# Patient Record
Sex: Male | Born: 1945 | Hispanic: No | Marital: Married | Smoking: Former smoker
Health system: Southern US, Community
[De-identification: ages and names within clinical notes are randomized; demographics above are authoritative.]

## PROBLEM LIST (undated history)

## (undated) DIAGNOSIS — I10 Essential (primary) hypertension: Secondary | ICD-10-CM

## (undated) DIAGNOSIS — I639 Cerebral infarction, unspecified: Secondary | ICD-10-CM

## (undated) DIAGNOSIS — M1990 Unspecified osteoarthritis, unspecified site: Secondary | ICD-10-CM

## (undated) DIAGNOSIS — Z91018 Allergy to other foods: Secondary | ICD-10-CM

## (undated) DIAGNOSIS — E7800 Pure hypercholesterolemia, unspecified: Secondary | ICD-10-CM

## (undated) HISTORY — PX: TOOTH EXTRACTION: SUR596

## (undated) HISTORY — PX: CATARACT EXTRACTION: SUR2

## (undated) HISTORY — PX: APPENDECTOMY: SHX54

## (undated) HISTORY — DX: Allergy to other foods: Z91.018

## (undated) HISTORY — DX: Unspecified osteoarthritis, unspecified site: M19.90

---

## 2009-04-23 HISTORY — PX: LUMBAR DISC SURGERY: SHX700

## 2013-08-22 ENCOUNTER — Ambulatory Visit (HOSPITAL_COMMUNITY)
Admission: EM | Admit: 2013-08-22 | Discharge: 2013-08-22 | Disposition: A | Discharge status: Home / Self Care | Payer: Worker's Compensation | Attending: Emergency Medicine | Admitting: Emergency Medicine

## 2013-08-22 ENCOUNTER — Encounter (HOSPITAL_COMMUNITY): Payer: Self-pay | Admitting: Emergency Medicine

## 2013-08-22 ENCOUNTER — Emergency Department (HOSPITAL_COMMUNITY): Payer: Worker's Compensation

## 2013-08-22 ENCOUNTER — Encounter (HOSPITAL_COMMUNITY): Payer: Worker's Compensation | Admitting: Anesthesiology

## 2013-08-22 ENCOUNTER — Encounter (HOSPITAL_COMMUNITY)
Admission: EM | Disposition: A | Discharge status: Home / Self Care | Payer: Self-pay | Source: Home / Self Care | Attending: Emergency Medicine

## 2013-08-22 ENCOUNTER — Emergency Department (HOSPITAL_COMMUNITY): Payer: Worker's Compensation | Admitting: Anesthesiology

## 2013-08-22 ENCOUNTER — Inpatient Hospital Stay: Admit: 2013-08-22 | Payer: Self-pay | Admitting: Orthopedic Surgery

## 2013-08-22 DIAGNOSIS — Y990 Civilian activity done for income or pay: Secondary | ICD-10-CM | POA: Insufficient documentation

## 2013-08-22 DIAGNOSIS — S62639B Displaced fracture of distal phalanx of unspecified finger, initial encounter for open fracture: Secondary | ICD-10-CM | POA: Insufficient documentation

## 2013-08-22 DIAGNOSIS — W230XXA Caught, crushed, jammed, or pinched between moving objects, initial encounter: Secondary | ICD-10-CM | POA: Insufficient documentation

## 2013-08-22 DIAGNOSIS — S6710XA Crushing injury of unspecified finger(s), initial encounter: Secondary | ICD-10-CM | POA: Insufficient documentation

## 2013-08-22 HISTORY — PX: I & D EXTREMITY: SHX5045

## 2013-08-22 HISTORY — PX: PERCUTANEOUS PINNING: SHX2209

## 2013-08-22 SURGERY — IRRIGATION AND DEBRIDEMENT EXTREMITY
Anesthesia: General | Site: Finger | Laterality: Left

## 2013-08-22 MED ORDER — OXYCODONE HCL 5 MG/5ML PO SOLN: 5.0000 mg | Freq: Once | ORAL | Status: DC | PRN

## 2013-08-22 MED ORDER — LIDOCAINE HCL (CARDIAC) 20 MG/ML IV SOLN
INTRAVENOUS | Status: DC | PRN
Start: 2013-08-22 — End: 2013-08-22
  Administered 2013-08-22: 60 mg via INTRAVENOUS

## 2013-08-22 MED ORDER — MIDAZOLAM HCL 2 MG/2ML IJ SOLN
INTRAMUSCULAR | Status: AC
  Filled 2013-08-22: qty 2

## 2013-08-22 MED ORDER — FENTANYL CITRATE 0.05 MG/ML IJ SOLN
INTRAMUSCULAR | Status: AC
  Filled 2013-08-22: qty 5

## 2013-08-22 MED ORDER — PROMETHAZINE HCL 25 MG/ML IJ SOLN: 6.2500 mg | INTRAMUSCULAR | Status: DC | PRN

## 2013-08-22 MED ORDER — HYDROMORPHONE HCL PF 1 MG/ML IJ SOLN: 0.2500 mg | INTRAMUSCULAR | Status: DC | PRN

## 2013-08-22 MED ORDER — EPHEDRINE SULFATE 50 MG/ML IJ SOLN
INTRAMUSCULAR | Status: DC | PRN
  Administered 2013-08-22 (×4): 5 mg via INTRAVENOUS

## 2013-08-22 MED ORDER — FENTANYL CITRATE 0.05 MG/ML IJ SOLN
INTRAMUSCULAR | Status: DC | PRN
  Administered 2013-08-22: 100 ug via INTRAVENOUS

## 2013-08-22 MED ORDER — SODIUM CHLORIDE 0.9 % IR SOLN
Status: DC | PRN
  Administered 2013-08-22: 3000 mL

## 2013-08-22 MED ORDER — MORPHINE SULFATE 4 MG/ML IJ SOLN
4.0000 mg | Freq: Once | INTRAMUSCULAR | Status: AC
  Administered 2013-08-22: 4 mg via INTRAVENOUS
  Filled 2013-08-22: qty 1

## 2013-08-22 MED ORDER — ONDANSETRON HCL 4 MG/2ML IJ SOLN
INTRAMUSCULAR | Status: AC
  Filled 2013-08-22: qty 2

## 2013-08-22 MED ORDER — PROPOFOL 10 MG/ML IV BOLUS
INTRAVENOUS | Status: DC | PRN
  Administered 2013-08-22: 160 mg via INTRAVENOUS

## 2013-08-22 MED ORDER — TETANUS-DIPHTH-ACELL PERTUSSIS 5-2.5-18.5 LF-MCG/0.5 IM SUSP
0.5000 mL | Freq: Once | INTRAMUSCULAR | Status: AC
  Administered 2013-08-22: 0.5 mL via INTRAMUSCULAR
  Filled 2013-08-22: qty 0.5

## 2013-08-22 MED ORDER — ONDANSETRON HCL 4 MG/2ML IJ SOLN
INTRAMUSCULAR | Status: DC | PRN
  Administered 2013-08-22: 4 mg via INTRAVENOUS

## 2013-08-22 MED ORDER — CEFAZOLIN SODIUM 1-5 GM-% IV SOLN
1.0000 g | Freq: Once | INTRAVENOUS | Status: AC
  Administered 2013-08-22: 1 g via INTRAVENOUS
  Filled 2013-08-22: qty 50

## 2013-08-22 MED ORDER — OXYCODONE-ACETAMINOPHEN 5-325 MG PO TABS: ORAL_TABLET | ORAL | Status: DC

## 2013-08-22 MED ORDER — CEFAZOLIN SODIUM 1-5 GM-% IV SOLN
INTRAVENOUS | Status: AC
  Filled 2013-08-22: qty 50

## 2013-08-22 MED ORDER — SODIUM CHLORIDE 0.9 % IV SOLN
INTRAVENOUS | Status: DC | PRN
  Administered 2013-08-22: 17:00:00 via INTRAVENOUS

## 2013-08-22 MED ORDER — MIDAZOLAM HCL 5 MG/5ML IJ SOLN
INTRAMUSCULAR | Status: DC | PRN
  Administered 2013-08-22: 2 mg via INTRAVENOUS

## 2013-08-22 MED ORDER — OXYCODONE HCL 5 MG PO TABS: 5.0000 mg | ORAL_TABLET | Freq: Once | ORAL | Status: DC | PRN

## 2013-08-22 MED ORDER — BUPIVACAINE HCL (PF) 0.25 % IJ SOLN
INTRAMUSCULAR | Status: AC
  Filled 2013-08-22: qty 60

## 2013-08-22 MED ORDER — CEFAZOLIN SODIUM-DEXTROSE 2-3 GM-% IV SOLR
INTRAVENOUS | Status: AC
  Filled 2013-08-22: qty 50

## 2013-08-22 MED ORDER — SUCCINYLCHOLINE CHLORIDE 20 MG/ML IJ SOLN
INTRAMUSCULAR | Status: AC
  Filled 2013-08-22: qty 1

## 2013-08-22 MED ORDER — BUPIVACAINE HCL (PF) 0.25 % IJ SOLN
INTRAMUSCULAR | Status: DC | PRN
  Administered 2013-08-22: 10 mL

## 2013-08-22 MED ORDER — CEFAZOLIN SODIUM 1-5 GM-% IV SOLN
INTRAVENOUS | Status: DC | PRN
  Administered 2013-08-22: 1 g via INTRAVENOUS

## 2013-08-22 MED ORDER — OXYCODONE-ACETAMINOPHEN 5-325 MG PO TABS
ORAL_TABLET | ORAL | Status: AC
  Administered 2013-08-22: 2 via ORAL
  Filled 2013-08-22: qty 2

## 2013-08-22 MED ORDER — SULFAMETHOXAZOLE-TRIMETHOPRIM 800-160 MG PO TABS: 1.0000 | ORAL_TABLET | Freq: Two times a day (BID) | ORAL | Status: DC

## 2013-08-22 MED ORDER — OXYCODONE-ACETAMINOPHEN 5-325 MG PO TABS
1.0000 | ORAL_TABLET | Freq: Once | ORAL | Status: AC
  Administered 2013-08-22: 2 via ORAL

## 2013-08-22 SURGICAL SUPPLY — 70 items
BANDAGE COBAN STERILE 2 (GAUZE/BANDAGES/DRESSINGS) IMPLANT
BANDAGE CONFORM 2  STR LF (GAUZE/BANDAGES/DRESSINGS) IMPLANT
BANDAGE ELASTIC 3 VELCRO ST LF (GAUZE/BANDAGES/DRESSINGS) ×3 IMPLANT
BANDAGE ELASTIC 4 VELCRO ST LF (GAUZE/BANDAGES/DRESSINGS) ×3 IMPLANT
BANDAGE GAUZE 4  KLING STR (GAUZE/BANDAGES/DRESSINGS) ×3 IMPLANT
BANDAGE GAUZE ELAST BULKY 4 IN (GAUZE/BANDAGES/DRESSINGS) ×3 IMPLANT
BENZOIN TINCTURE PRP APPL 2/3 (GAUZE/BANDAGES/DRESSINGS) ×3 IMPLANT
BLADE 10 SAFETY STRL DISP (BLADE) ×3 IMPLANT
BLADE SURG ROTATE 9660 (MISCELLANEOUS) ×3 IMPLANT
BNDG COHESIVE 1X5 TAN STRL LF (GAUZE/BANDAGES/DRESSINGS) ×3 IMPLANT
BNDG ESMARK 4X9 LF (GAUZE/BANDAGES/DRESSINGS) ×3 IMPLANT
CLOSURE WOUND 1/2 X4 (GAUZE/BANDAGES/DRESSINGS) ×1
CORDS BIPOLAR (ELECTRODE) ×3 IMPLANT
COVER SURGICAL LIGHT HANDLE (MISCELLANEOUS) ×3 IMPLANT
CUFF TOURNIQUET SINGLE 18IN (TOURNIQUET CUFF) IMPLANT
CUFF TOURNIQUET SINGLE 24IN (TOURNIQUET CUFF) IMPLANT
DECANTER SPIKE VIAL GLASS SM (MISCELLANEOUS) ×3 IMPLANT
DRAIN PENROSE 1/4X12 LTX STRL (WOUND CARE) IMPLANT
DRAPE C-ARM MINI 42X72 WSTRAPS (DRAPES) ×3 IMPLANT
DRAPE OEC MINIVIEW 54X84 (DRAPES) ×3 IMPLANT
DRAPE SURG 17X23 STRL (DRAPES) ×3 IMPLANT
DRSG ADAPTIC 3X8 NADH LF (GAUZE/BANDAGES/DRESSINGS) IMPLANT
DRSG EMULSION OIL 3X3 NADH (GAUZE/BANDAGES/DRESSINGS) ×3 IMPLANT
DRSG PAD ABDOMINAL 8X10 ST (GAUZE/BANDAGES/DRESSINGS) ×6 IMPLANT
DURAPREP 26ML APPLICATOR (WOUND CARE) ×3 IMPLANT
GAUZE XEROFORM 1X8 LF (GAUZE/BANDAGES/DRESSINGS) ×3 IMPLANT
GLOVE BIO SURGEON STRL SZ7.5 (GLOVE) ×3 IMPLANT
GLOVE BIOGEL PI IND STRL 8 (GLOVE) ×1 IMPLANT
GLOVE BIOGEL PI INDICATOR 8 (GLOVE) ×2
GOWN BRE IMP PREV XXLGXLNG (GOWN DISPOSABLE) ×3 IMPLANT
GOWN STRL REIN XL XLG (GOWN DISPOSABLE) ×3 IMPLANT
GOWN STRL REUS W/ TWL LRG LVL3 (GOWN DISPOSABLE) ×2 IMPLANT
GOWN STRL REUS W/TWL LRG LVL3 (GOWN DISPOSABLE) ×4
HANDPIECE INTERPULSE COAX TIP (DISPOSABLE)
K-WIRE .035X5.75  STT K (WIRE) ×6 IMPLANT
KIT BASIN OR (CUSTOM PROCEDURE TRAY) ×3 IMPLANT
KIT ROOM TURNOVER OR (KITS) ×3 IMPLANT
LOOP VESSEL MAXI BLUE (MISCELLANEOUS) IMPLANT
LOOP VESSEL MINI RED (MISCELLANEOUS) IMPLANT
MANIFOLD NEPTUNE II (INSTRUMENTS) ×3 IMPLANT
NEEDLE HYPO 25GX1X1/2 BEV (NEEDLE) ×3 IMPLANT
NEEDLE HYPO 25X1 1.5 SAFETY (NEEDLE) IMPLANT
NS IRRIG 1000ML POUR BTL (IV SOLUTION) ×3 IMPLANT
PACK ORTHO EXTREMITY (CUSTOM PROCEDURE TRAY) ×3 IMPLANT
PAD ARMBOARD 7.5X6 YLW CONV (MISCELLANEOUS) ×6 IMPLANT
SCRUB BETADINE 4OZ XXX (MISCELLANEOUS) ×3 IMPLANT
SET HNDPC FAN SPRY TIP SCT (DISPOSABLE) IMPLANT
SOLUTION BETADINE 4OZ (MISCELLANEOUS) ×3 IMPLANT
SPLINT FINGER (SOFTGOODS) ×3 IMPLANT
SPONGE GAUZE 4X4 12PLY (GAUZE/BANDAGES/DRESSINGS) ×3 IMPLANT
SPONGE GAUZE 4X4 12PLY STER LF (GAUZE/BANDAGES/DRESSINGS) ×3 IMPLANT
SPONGE LAP 18X18 X RAY DECT (DISPOSABLE) ×3 IMPLANT
SPONGE LAP 4X18 X RAY DECT (DISPOSABLE) ×3 IMPLANT
STRIP CLOSURE SKIN 1/2X4 (GAUZE/BANDAGES/DRESSINGS) ×2 IMPLANT
SUCTION FRAZIER TIP 10 FR DISP (SUCTIONS) ×3 IMPLANT
SUT CHROMIC 6 0 PS 4 (SUTURE) ×3 IMPLANT
SUT ETHILON 4 0 P 3 18 (SUTURE) IMPLANT
SUT ETHILON 4 0 PS 2 18 (SUTURE) IMPLANT
SUT MON AB 5-0 P3 18 (SUTURE) ×3 IMPLANT
SUT PROLENE 4 0 P 3 18 (SUTURE) IMPLANT
SYR CONTROL 10ML LL (SYRINGE) ×3 IMPLANT
TOWEL OR 17X24 6PK STRL BLUE (TOWEL DISPOSABLE) ×3 IMPLANT
TOWEL OR 17X26 10 PK STRL BLUE (TOWEL DISPOSABLE) ×3 IMPLANT
TUBE ANAEROBIC SPECIMEN COL (MISCELLANEOUS) IMPLANT
TUBE CONNECTING 12'X1/4 (SUCTIONS) ×1
TUBE CONNECTING 12X1/4 (SUCTIONS) ×2 IMPLANT
TUBE FEEDING 5FR 15 INCH (TUBING) IMPLANT
UNDERPAD 30X30 INCONTINENT (UNDERPADS AND DIAPERS) ×3 IMPLANT
WATER STERILE IRR 1000ML POUR (IV SOLUTION) ×3 IMPLANT
YANKAUER SUCT BULB TIP NO VENT (SUCTIONS) ×3 IMPLANT

## 2013-08-22 NOTE — ED Notes (Signed)
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 Pt given 50 MCG of fentanyl by EMS.

## 2013-08-22 NOTE — ED Provider Notes (Signed)
CSN: [id number]     Arrival date & time 08/22/13  1233 History  This chart was scribed for Santiago GladHeather Mayu Ronk, PA-C  working with Juliet RudeNathan R. Rubin PayorPickering, MD by Ashley JacobsBrittany Andrews, ED scribe. This patient was seen in room TR11C/TR11C and the patient's care was started at 1:23 PM.    First MD Initiated Contact with Patient 08/22/13 1249     Chief Complaint  Patient presents with  . Finger Injury    left ring finger     (Consider location/radiation/quality/duration/timing/severity/associated sxs/prior Treatment) The history is provided by the patient and medical records. No language interpreter was used.   HPI Comments: Joyce CopaSum Y Briel is a 68 y.o. male who presents to the Emergency Department via EMS complaining of left fourth finger injury that occurred around a hour ago. Pt slammed the tip of his left ring finger in the door of a laundry machine while at work..  Pt has numbness and moderate pain to the site. Pt was administered 50 MCG of fentanyl via EMS, which helped with the pain. Pt is unsure if his tetanus shot is UTD.  History reviewed. No pertinent past medical history. No past surgical history on file. No family history on file. History  Substance Use Topics  . Smoking status: Not on file  . Smokeless tobacco: Not on file  . Alcohol Use: Not on file    Review of Systems  Skin:       Deep laceration to the left ring finger      Allergies  Review of patient's allergies indicates no known allergies.  Home Medications   Prior to Admission medications   Not on File   BP 146/66  Pulse 65  Temp(Src) 97.5 F (36.4 C) (Oral)  SpO2 98% Physical Exam  Nursing note and vitals reviewed. Constitutional: He is oriented to person, place, and time. He appears well-developed and well-nourished.  HENT:  Head: Normocephalic and atraumatic.  Cardiovascular: Normal rate, regular rhythm and normal heart sounds.   Pulmonary/Chest: Effort normal and breath sounds normal.  Musculoskeletal:    Left ring finger with laceration just proximal to the nail.  Ulnar deviation of tip of finger.  Intact capillary refill and sensation present  of fingertip.  Decreased flexion and extension at the DIP of the left 4th digit.    Neurological: He is alert and oriented to person, place, and time.  Sensation is intact   Psychiatric: He has a normal mood and affect.    ED Course  Procedures (including critical care time) DIAGNOSTIC STUDIES: Oxygen Saturation is 98% on room air, normal by my interpretation.    COORDINATION OF CARE:  1:19 PM Discussed course of care with pt . Pt understands and agrees.   Labs Review Labs Reviewed - No data to display  Imaging Review No results found.   EKG Interpretation None     3:09 PM Discussed with Dr. Merlyn LotKuzma with Hand Surgery.  He recommends giving the patient Ancef IV.  He reports that he will come see patient in the ED. MDM   Final diagnoses:  None  Patient with open compound fracture of the left 4th digit.  Tetanus updated.  Patient given 1 gram Ancef IV in the ED.  Dr. Merlyn LotKuzma with Hand Surgery consulted.  Patient brought to the OR and finger repaired by Dr. Merlyn LotKuzma.    I personally performed the services described in this documentation, which was scribed in my presence. The recorded information has been reviewed and is accurate.  Santiago GladHeather Taylar Hartsough, PA-C 08/25/13 2239

## 2013-08-22 NOTE — ED Notes (Signed)
Dr Pickering in w/pt.  

## 2013-08-22 NOTE — H&P (Signed)
  Nathan Bruce is an 68 y.o. male.   Chief Complaint: left ring finger crush HPI: 68 yo rhd male states he crushed tip of left ring finger in washing machine door at work today.  Present with two family members who are helping to interpret as he speaks vietnamese.  Reports no previous injury to left ring finger and no other injury at this time.  History reviewed. No pertinent past medical history.  Past Surgical History  Procedure Laterality Date  . Lumbar disc surgery  2011    in [place]     No family history on file. Social History:  has no tobacco, alcohol, and drug history on file.  Allergies: No Known Allergies   (Not in a hospital admission)  No results found for this or any previous visit (from the past 48 hour(s)).  Dg Finger Ring Left  08/22/2013   CLINICAL DATA:  Pain and numbness in the distal left ring finger following a crush injury.  EXAM: LEFT RING FINGER 2+V  COMPARISON:  None.  FINDINGS: Comminuted fracture of the fourth distal phalanx with a skin laceration and soft tissue swelling. There is 1/3 shaft width of radial post placement there is ulnar angulation of the distal fragment.  IMPRESSION: Comminuted fracture of the fourth distal phalanx with an associated large skin laceration, as described above.   Electronically Signed   By: Gordan PaymentSteve  Reid M.D.   On: 08/22/2013 14:33     A comprehensive review of systems was negative.  Blood pressure 146/66, pulse 65, temperature 97.5 F (36.4 C), temperature source Oral, SpO2 98.00%.  General appearance: alert, cooperative and appears stated age Head: Normocephalic, without obvious abnormality, atraumatic Neck: supple, symmetrical, trachea midline Resp: clear to auscultation bilaterally Cardio: regular rate and rhythm GI: non tender Extremities: intact sensation and capillary refill all digits.  +epl/fpl/io.  right ue: no wounds or ttp.  left ue: no wounds or ttp except ring finger.  left ring finger with volar and radial  wound at level of base of nail.  ulnar deviation of tip of finger.  intact capillary refill and sensation present on both radial and ulnar sides of fingertip.  able to weakly flex and extend at dip joint.  Pulses: 2+ and symmetric Skin: Skin color, texture, turgor normal. No rashes or lesions Neurologic: Grossly normal Incision/Wound: As above  Assessment/Plan Left ring fingertip crush with open fracture.  Recommend OR for I&D of fracture and pinning of distal phalanx and dip joint.  Repair of tendon/artery/nerve as indicated.  Risks, benefits, and alternatives of surgery were discussed and the patient agrees with the plan of care.  A phone interpreter was used to obtain informed consent.   Tami RibasKevin R Annalina Needles 08/22/2013, 3:59 PM

## 2013-08-22 NOTE — Op Note (Signed)
Intra-operative fluoroscopic images in the AP, lateral, and oblique views were taken and evaluated by myself.  Reduction and hardware placement were confirmed.  There was no intraarticular penetration of permanent hardware.  

## 2013-08-22 NOTE — Op Note (Signed)
NAMShelbie Bruce:  [place], [place]                    ACCOUNT NO.:  [id number]  MEDICAL RECORD NO.:  [id number]  LOCATION:  [facility]                         FACILITY:  [facility]  PHYSICIAN:  Betha LoaKevin Edwardine Deschepper, MD        DATE OF BIRTH:  July 10, 1945  DATE OF PROCEDURE:  08/22/2013 DATE OF DISCHARGE:  08/22/2013                              OPERATIVE REPORT   PREOPERATIVE DIAGNOSIS:  Left ring finger tip crush injury.  POSTOPERATIVE DIAGNOSIS:  Left ring finger tip crush injury.  PROCEDURE:   1. Irrigation and debridement of left ring distal phalanx open fracture 2. Open reduction and percutaneous pinning of left ring finger distal   phalanx fracture 3. Repair of nail bed and skin lacerations.  SURGEON:  Betha LoaKevin Serena Petterson, MD  ASSISTANT:  None.  ANESTHESIA:  General.  IV FLUIDS:  Per Anesthesia flow sheet.  ESTIMATED BLOOD LOSS:  Minimal.  COMPLICATIONS:  None.  SPECIMENS:  None.  TOURNIQUET TIME:  Fifty seven minutes.  DISPOSITION:  Stable to PACU.  INDICATIONS:  [name] is a 68 year old right-hand dominant male who states while at work, he smashed his left ring finger tip in an industrial washing machine causing laceration and crushing to the fingertip.  [facility] was [facility] to [facility] where he was evaluated.  Radiographs revealed a distal phalanx fracture.  I was consulted for management of injury.  On examination, he had intact sensation and capillary refill in the fingertip.  He had a laceration on the radial and volar sides of the finger involving the nail on the dorsal side.  His radiographs showed a comminuted fracture of the distal phalanx.  I recommended [name] go into the operating room for irrigation and debridement of the open fracture, fixation of the fracture, and repair of tendon, artery, and nerve as necessary.  Risks, benefits, and alternatives of surgery were discussed including the risk of blood loss, infection, damage to nerves, vessels, tendons, ligaments, bone;  failure of surgery; need for additional surgery, complications with wound healing, continued pain, nonunion, malunion, stiffness.  He voiced understanding of these risks and elected to proceed.  OPERATIVE COURSE:  After being identified preoperatively by myself, the patient and I agreed upon procedure and site procedure.  Surgical site was marked.  Risks, benefits, and alternatives of surgery were reviewed and he wished to proceed.  Surgical consent had been signed.  A telephone-based interpreter was used to obtain the informed consent.  He had been given IV Ancef, his tetanus updated in the emergency department.  He was given an additional g of IV Ancef, before going to the operating room.  He was transferred to the operating room and placed on the operating room table in supine position.  The left upper extremity on arm board.  General anesthesia was induced by anesthesiologist.  Left upper extremity was prepped and draped in normal sterile orthopedic fashion.  Surgical pause was performed between surgeons, anesthesia, operating staff, and all were in agreement as to the patient, procedure, and site procedure.  Tourniquet at the proximal aspect of the extremity was inflated to 250 mmHg after exsanguination of the limb with Esmarch bandage.  The wound was explored.  The radial digital nerve and artery were identified and were intact.  The FDP tendon was identified and was intact into its insertion at the proximal portion of the fracture.  There was no gross contamination.  Clot was removed.  The open fracture was irrigated with 3000 mL of sterile saline by cysto tubing.  It was then reduced under direct visualization and two 0.035-inch K-wire was advanced from the tip of the finger across the fracture and across the DIP joint.  C-arm was used in AP and lateral projections to ensure appropriate reduction, position of hardware which was the case.  Good reduction was obtained.  The finger  was clinically in good alignment.  A 5-0 Monocryl suture was then used to repair the skin lacerations and 6-0 chromic suture was used to repair the nail bed lacerations.  Some horizontal mattress sutures for the nail bed had to be used.  There was good apposition of all soft tissues.  The pins were bent and cut short.  A piece of Xeroform was placed in the nail fold and the wounds were all dressed with sterile Xeroform, 4x4s, and wrapped with a Kling bandage lightly.  A Alumafoam splint was placed and wrapped with a Kling and Coban dressing lightly.  The tourniquet was deflated at 57 minutes.  Fingertips were pink with brisk capillary refill after deflation of tourniquet.  Operative drapes were broken down.  The patient was awoken from anesthesia safely.  He was transferred back to stretcher and taken to PACU in stable condition.  I will see him back in the office in 1 week for postoperative followup.  I will give him Percocet 5/325, 1-2 p.o. q.6 hours p.r.n. pain, dispensed #40, and Bactrim DS 1 p.o. b.i.d. x7 days.     Betha LoaKevin Riona Lahti, MD     KK/MEDQ  D:  08/22/2013  T:  08/22/2013  Job:  [id number]

## 2013-08-22 NOTE — Anesthesia Postprocedure Evaluation (Signed)
Anesthesia Post Note  Patient: Nathan Bruce  Procedure(s) Performed: Procedure(s) (LRB): IRRIGATION AND DEBRIDEMENT EXTREMITY (Left) PERCUTANEOUS PINNING EXTREMITY (Left)  Anesthesia type: general  Patient location: PACU  Post pain: Pain level controlled  Post assessment: Patient's Cardiovascular Status Stable  Last Vitals:  Filed Vitals:   08/22/13 1900  BP:   Pulse: 64  Temp: 36.7 C  Resp: 16    Post vital signs: Reviewed and stable  Level of consciousness: sedated  Complications: No apparent anesthesia complications

## 2013-08-22 NOTE — Anesthesia Preprocedure Evaluation (Addendum)
Anesthesia Evaluation  Patient identified by MRN, date of birth, ID band Patient awake    Reviewed: Allergy & Precautions, H&P , NPO status , Patient's Chart, lab work & pertinent test results  History of Anesthesia Complications Negative for: history of anesthetic complications  Airway Mallampati: II TM Distance: >3 FB Neck ROM: full    Dental  (+) Poor Dentition, Dental Advidsory Given   Pulmonary neg pulmonary ROS,    Pulmonary exam normal       Cardiovascular negative cardio ROS  Rhythm:regular Rate:Normal     Neuro/Psych negative neurological ROS  negative psych ROS   GI/Hepatic negative GI ROS, Neg liver ROS,   Endo/Other  negative endocrine ROS  Renal/GU negative Renal ROS     Musculoskeletal   Abdominal   Peds  Hematology   Anesthesia Other Findings   Reproductive/Obstetrics negative OB ROS                           Anesthesia Physical Anesthesia Plan  ASA: I and emergent  Anesthesia Plan:    Post-op Pain Management:    Induction:   Airway Management Planned:   Additional Equipment:   Intra-op Plan:   Post-operative Plan:   Informed Consent: I have reviewed the patients History and Physical, chart, labs and discussed the procedure including the risks, benefits and alternatives for the proposed anesthesia with the patient or authorized representative who has indicated his/her understanding and acceptance.   Dental Advisory Given  Plan Discussed with: Anesthesiologist, CRNA and Surgeon  Anesthesia Plan Comments:        Anesthesia Quick Evaluation

## 2013-08-22 NOTE — Brief Op Note (Signed)
08/22/2013  6:18 PM  PATIENT:  Nathan Bruce  68 y.o. male  PRE-OPERATIVE DIAGNOSIS:  crush injury  POST-OPERATIVE DIAGNOSIS:  crush injury  PROCEDURE:  Procedure(s) with comments: IRRIGATION AND DEBRIDEMENT EXTREMITY (Left) - left ring finger PERCUTANEOUS PINNING EXTREMITY (Left) - left ring finger  SURGEON:  Surgeon(s) and Role:    * Tami RibasKevin R Abb Gobert, MD - Primary  PHYSICIAN ASSISTANT:   ASSISTANTS: none   ANESTHESIA:   general  EBL:     BLOOD ADMINISTERED:none  DRAINS: none   LOCAL MEDICATIONS USED:  MARCAINE     SPECIMEN:  No Specimen  DISPOSITION OF SPECIMEN:  N/A  COUNTS:  YES  TOURNIQUET:   Total Tourniquet Time Documented: Upper Arm (Left) - 57 minutes Total: Upper Arm (Left) - 57 minutes   DICTATION: .Other Dictation: Dictation Number [id number]  PLAN OF CARE: Discharge to home after PACU  PATIENT DISPOSITION:  PACU - hemodynamically stable.

## 2013-08-22 NOTE — Transfer of Care (Signed)
Immediate Anesthesia Transfer of Care Note  Patient: Nathan Bruce  Procedure(s) Performed: Procedure(s) with comments: IRRIGATION AND DEBRIDEMENT EXTREMITY (Left) - left ring finger PERCUTANEOUS PINNING EXTREMITY (Left) - left ring finger  Patient Location: PACU  Anesthesia Type:General  Level of Consciousness: awake and alert   Airway & Oxygen Therapy: Patient Spontanous Breathing and Patient connected to nasal cannula oxygen  Post-op Assessment: Report given to PACU RN  Post vital signs: Reviewed and stable  Complications: No apparent anesthesia complications

## 2013-08-22 NOTE — Discharge Instructions (Signed)

## 2013-08-22 NOTE — Op Note (Signed)
[id number] 

## 2013-08-25 ENCOUNTER — Encounter (HOSPITAL_COMMUNITY): Payer: Self-pay | Admitting: Orthopedic Surgery

## 2013-08-27 NOTE — ED Provider Notes (Signed)
Medical screening examination/treatment/procedure(s) were performed by non-physician practitioner and as supervising physician I was immediately available for consultation/collaboration.   EKG Interpretation None       Juliet RudeNathan R. Rubin PayorPickering, MD 08/27/13 [phone]

## 2014-07-11 ENCOUNTER — Ambulatory Visit (INDEPENDENT_AMBULATORY_CARE_PROVIDER_SITE_OTHER): Payer: Commercial Managed Care - PPO | Admitting: Emergency Medicine

## 2014-07-11 VITALS — BP 120/60 | HR 84 | Temp 97.7°F | Resp 20 | Ht 64.0 in | Wt 152.2 lb

## 2014-07-11 DIAGNOSIS — K529 Noninfective gastroenteritis and colitis, unspecified: Secondary | ICD-10-CM | POA: Diagnosis not present

## 2014-07-11 DIAGNOSIS — R110 Nausea: Secondary | ICD-10-CM | POA: Diagnosis not present

## 2014-07-11 LAB — POCT CBC
GRANULOCYTE PERCENT: 88.7 % — AB (ref 37–80)
HEMATOCRIT: 44.6 % (ref 43.5–53.7)
Hemoglobin: 14.4 g/dL (ref 14.1–18.1)
LYMPH, POC: 0.5 — AB (ref 0.6–3.4)
MCH: 26.6 pg — AB (ref 27–31.2)
MCHC: 32.3 g/dL (ref 31.8–35.4)
MCV: 82.3 fL (ref 80–97)
MID (CBC): 0.6 (ref 0–0.9)
MPV: 6.8 fL (ref 0–99.8)
PLATELET COUNT, POC: 219 10*3/uL (ref 142–424)
POC Granulocyte: 8.3 — AB (ref 2–6.9)
POC LYMPH %: 5.2 % — AB (ref 10–50)
POC MID %: 6.1 %M (ref 0–12)
RBC: 5.42 M/uL (ref 4.69–6.13)
RDW, POC: 14.4 %
WBC: 9.4 10*3/uL (ref 4.6–10.2)

## 2014-07-11 MED ORDER — LOPERAMIDE HCL 2 MG PO TABS: ORAL_TABLET | ORAL | Status: DC

## 2014-07-11 MED ORDER — ONDANSETRON 8 MG PO TBDP: 8.0000 mg | ORAL_TABLET | Freq: Three times a day (TID) | ORAL | Status: DC | PRN

## 2014-07-11 MED ORDER — ONDANSETRON 4 MG PO TBDP
8.0000 mg | ORAL_TABLET | Freq: Once | ORAL | Status: AC
  Administered 2014-07-11: 8 mg via ORAL

## 2014-07-11 NOTE — Progress Notes (Signed)
[facility] [address], [place] [address] [phone]  Date:  07/11/2014   Name:  Nathan Bruce   DOB:  1945/10/15   MRN:  [id number]  PCP:  No PCP Per Patient    Chief Complaint: Abdominal Pain   History of Present Illness:  Nathan Bruce is a 69 y.o. very pleasant male patient who presents with the following:  Yesterday developed nausea and vomiting.  Poor po intake Diarrhea watery in nature. The patient has no complaint of blood, mucous, or pus in her stools. No fever or chills. Now worse and has epigastric burning. No ill contacts Lots of caffeine. No alcohol Non smoker. No excess NSAID or ASA. No improvement with over the counter medications or other home remedies. Denies other complaint or health concern today.   There are no active problems to display for this patient.   No past medical history on file.  Past Surgical History  Procedure Laterality Date  . Lumbar disc surgery  2011    in [place]   . I&d extremity Left 08/22/2013    Procedure: IRRIGATION AND DEBRIDEMENT EXTREMITY;  Surgeon: Tami Ribas, MD;  Location: [facility] OR;  Service: Orthopedics;  Laterality: Left;  left ring finger  . Percutaneous pinning Left 08/22/2013    Procedure: PERCUTANEOUS PINNING EXTREMITY;  Surgeon: Tami Ribas, MD;  Location: [facility] OR;  Service: Orthopedics;  Laterality: Left;  left ring finger    History  Substance Use Topics  . Smoking status: Never Smoker   . Smokeless tobacco: Never Used  . Alcohol Use: No    No family history on file.  No Known Allergies  Medication list has been reviewed and updated.  No current outpatient prescriptions on file prior to visit.   No current facility-administered medications on file prior to visit.    Review of Systems:  As per HPI, otherwise negative.    Physical Examination: Filed Vitals:   07/11/14 0848  BP: 120/60  Pulse: 84  Temp: 97.7 F (36.5 C)  Resp: 20   Filed Vitals:   07/11/14 0848   Height:  (1.626 m)  Weight: 152 lb 4 oz (69.06 kg)   Body mass index is 26.12 kg/(m^2). Ideal Body Weight: Weight in (lb) to have BMI = 25: 145.3  GEN: WDWN, NAD, Non-toxic, A & O x 3  Dry appearing.  Not icteric HEENT: Atraumatic, Normocephalic. Neck supple. No masses, No LAD. Ears and Nose: No external deformity. CV: RRR, No M/G/R. No JVD. No thrill. No extra heart sounds. PULM: CTA B, no wheezes, crackles, rhonchi. No retractions. No resp. distress. No accessory muscle use. ABD: S, NT, ND, +BS. No rebound. No HSM. EXTR: No c/c/e NEURO Normal gait.  PSYCH: Normally interactive. Conversant. Not depressed or anxious appearing.  Calm demeanor.    Assessment and Plan: Gastroenteritis zofran Imodium Clears  Signed,  Phillips Odor, MD   Results for orders placed or performed in visit on 07/11/14  POCT CBC  Result Value Ref Range   WBC 9.4 4.6 - 10.2 K/uL   Lymph, poc 0.5 (A) 0.6 - 3.4   POC LYMPH PERCENT 5.2 (A) 10 - 50 %L   MID (cbc) 0.6 0 - 0.9   POC MID % 6.1 0 - 12 %M   POC Granulocyte 8.3 (A) 2 - 6.9   Granulocyte percent 88.7 (A) 37 - 80 %G   RBC 5.42 4.69 - 6.13 M/uL   Hemoglobin 14.4 14.1 -  18.1 g/dL   HCT, POC 26.744.6 12.443.5 - 53.7 %   MCV 82.3 80 - 97 fL   MCH, POC 26.6 (A) 27 - 31.2 pg   MCHC 32.3 31.8 - 35.4 g/dL   RDW, POC 58.014.4 %   Platelet Count, POC 219 142 - 424 K/uL   MPV 6.8 0 - 99.8 fL

## 2014-07-11 NOTE — Patient Instructions (Signed)
Clear Liquid Diet A clear liquid diet is a short-term diet that is prescribed to provide the necessary fluid and basic energy you need when you can have nothing else. The clear liquid diet consists of liquids or solids that will become liquid at room temperature. You should be able to see through the liquid. There are many reasons that you may be restricted to clear liquids, such as:  When you have a sudden-onset (acute) condition that occurs before or after surgery.  To help your body slowly get adjusted to food again after a long period when you were unable to have food.  Replacement of fluids when you have a diarrheal disease.  When you are going to have certain exams, such as a colonoscopy, in which instruments are inserted inside your body to look at parts of your digestive system. WHAT CAN I HAVE? A clear liquid diet does not provide all the nutrients you need. It is important to choose a variety of the following items to get as many nutrients as possible:  Vegetable juices that do not have pulp.  Fruit juices and fruit drinks that do not have pulp.  Coffee (regular or decaffeinated), tea, or soda at the discretion of your health care provider.  Clear bouillon, broth, or strained broth-based soups.  High-protein and flavored gelatins.  Sugar or honey.  Ices or frozen ice pops that do not contain milk. If you are not sure whether you can have certain items, you should ask your health care provider. You may also ask your health care provider if there are any other clear liquid options. Document Released: 04/09/2005 Document Revised: 04/14/2013 Document Reviewed: 03/06/2013 [facility] Patient Information 2015 [place], [place]. This information is not intended to replace advice given to you by your health care provider. Make sure you discuss any questions you have with your health care provider. Vim ???ng Tiu Ha Do Vi Rt (Viral Gastroenteritis) Vim ???ng tiu ha do vi rt cn ???c  g?i l cm d? dy. Tnh tr?ng ny ?nh h??ng ??n d? dy v ???ng ru?t. N c th? gy tiu ch?y v nn m?a ??t ng?t. B?nh th??ng ko di t? 3 ??n 8 ngy. H?u h?t m?i ng??i c ?p ?ng mi?n d?ch m cu?i cng s? kh?i nhi?m vi rt. Trong khi c ?p ?ng t? nhin ny, vi rt c th? lm cho b?n r?t m?t. NGUYN NHN Nhi?u vi rt khc nhau c th? gy ra vim ???ng tiu ha, ch?ng h?n nh? rotavirus ho?c norovirus. B?n c th? b? nhi?m m?t trong nh?ng lo?i vi rt ny do tiu th? th?c ph?m ho?c n??c b? nhi?m b?n. B?n c?ng c th? b? nhi?m vi rt do dng chung d?ng c? ho?c v?t d?ng c nhn khc v?i ng??i b? b?nh ho?c do ch?m vo b? m?t b? nhi?m b?n. TRI?U CH?NG Cc tri?u ch?ng ph? bi?n nh?t l tiu ch?y v nn m?a. Nh?ng v?n ?? ny c th? khi?n cho c? th? m?t d?ch (n??c) nghim tr?ng v c? th? m?t cn b?ng mu?i (?i?n gi?i). Cc tri?u ch?ng khc c th? bao g?m:  S?t.  ?au ??u.  M?t m?i.  ?au b?ng. CH?N ?ON Chuyn gia ch?m [place] s?c kh?e th??ng c th? ch?n ?on vim ???ng tiu ha do vi rt d?a vo cc tri?u ch?ng v khm th?c th?. M?u phn c?ng c th? ???c l?y ?? xt nghi?m xem c s? hi?n di?n c?a vi rt ho?c nhi?m trng khc khng. ?I?U TR? B?nh ny th??ng t? kh?i. ?i?u  tr? nh?m m?c ?ch b n??c. Cc tr??ng h?p nghim tr?ng nh?t c?a vim ???ng tiu ha do vi rt lin quan ??n nn m?a tr?m tr?ng khi?n b?n khng th? gi? ???c n??c trong c? th?. Trong nh?ng tr??ng h?p ny, ch?t l?ng ph?i ???c ??a vo c? th? thng qua m?t ???ng truy?n t?nh m?ch (IV). H??NG D?N CH?M [employer] T?I NH  U?ng ?? n??c ?? gi? cho n??c ti?u trong ho?c vng nh?t. U?ng m?t l??ng nh? ch?t l?ng th??ng xuyn v t?ng ln theo kh? n?ng dung n?p.  H?i chuyn gia ch?m [facility] s?c kh?e ?? ???c h??ng d?n b n??c c? th?.  Trnh:  Th?c ph?m c hm l??ng ???ng cao.  R??u.  ?? u?ng c ga.  Thu?c l.  N??c p tri cy.  ?? u?ng c caffeine.  Ch?t l?ng c?c nng ho?c l?nh.  Th?c ph?m bo, c nhi?u d?u.  ?n ho?c u?ng b?t c? th? g qu nhi?u m?t  lc.  S?n ph?m t? s?a cho ??n 24 - 48 gi? sau khi d?ng tiu ch?y.  B?n c th? tiu th? cc ch? ph?m sinh h?c. Probiotics l s? nui c?y vi khu?n c l?i ch? ??ng. Chng c th? lm gi?m b?t l??ng phn v s? l?n tiu ch?y ? ng??i l?n. Probiotics c th? ???c tm th?y trong s?a chua v?i nh?ng nui c?y ch? ??ng v cc ch?t b? sung.  R?a tay k? ?? trnh ly lan vi rt.  Ch? s? d?ng thu?c khng c?n k toa ho?c thu?c c?n k toa ?? gi?m ?au, gi?m c?m gic kh ch?u ho?c h? s?t theo ch? d?n c?a chuyn gia ch?m [place] s?c kh?e c?a b?n. Khng cho tr? em s? d?ng aspirin. Khng nn dng thu?c ch?ng tiu ch?y.  Hy h?i chuyn gia ch?m [place] s?c kh?e c?a b?n xem c nn ti?p t?c u?ng thu?c ???c k toa thng th??ng ho?c thu?c khng c?n k toa c?a b?n khng.  Tun th? m?i cu?c h?n khm l?i theo ch? d?n c?a chuyn gia ch?m [place] s?c kh?e. HY NGAY L?P T?C ?I KHM N?U:  B?n khng th? gi? ch?t l?ng trong ng??i.  B?n khng ?i ti?u t nh?t m?t l?n m?i 6 ??n 8 ti?ng.  B?n b? kh th?.  B?n th?y c mu trong phn ho?c ch?t nn. Phn ho?c ch?t nn c th? trng nh? b c ph.  B?n b? ?au b?ng gia t?ng ho?c ?au t?p trung ? m?t vng nh? (c?c b?).  B?n b? nn m?a ho?c tiu ch?y dai d?ng.  B?n b? s?t.  B?nh nhn l tr? d??i 3 thng tu?i v b b? s?t.  B?nh nhn l tr? trn 3 thng tu?i v b b? s?t v c cc tri?u ch?ng ko di.  B?nh nhn l tr? trn 3 thng tu?i v b b? s?t v c cc tri?u ch?ng ??t nhin tr? nn t?i t? h?n.  B?nh nhn l m?t em b v b khng c n??c m?t khi khc. ??M B?O B?N:  Hi?u cc h??ng d?n ny.  S? theo di tnh tr?ng c?a mnh.  S? yu c?u tr? gip ngay l?p t?c n?u b?n c?m th?y khng ?? ho?c tnh tr?ng tr?m tr?ng h?n. Document Released: 07/02/2011 Document Revised: 12/10/2012 [facility] Patient Information 2015 [place], [place]. This information is not intended to replace advice given to you by your health care provider. Make sure you discuss any questions you have with your health care  provider.

## 2014-09-29 ENCOUNTER — Emergency Department (HOSPITAL_COMMUNITY): Payer: Commercial Managed Care - PPO

## 2014-09-29 ENCOUNTER — Emergency Department (HOSPITAL_COMMUNITY)
Admission: EM | Admit: 2014-09-29 | Discharge: 2014-09-30 | Disposition: A | Discharge status: Home / Self Care | Payer: Commercial Managed Care - PPO | Attending: Emergency Medicine | Admitting: Emergency Medicine

## 2014-09-29 ENCOUNTER — Encounter (HOSPITAL_COMMUNITY): Payer: Self-pay | Admitting: *Deleted

## 2014-09-29 DIAGNOSIS — I10 Essential (primary) hypertension: Secondary | ICD-10-CM | POA: Insufficient documentation

## 2014-09-29 DIAGNOSIS — N23 Unspecified renal colic: Secondary | ICD-10-CM | POA: Diagnosis not present

## 2014-09-29 DIAGNOSIS — R1031 Right lower quadrant pain: Secondary | ICD-10-CM | POA: Diagnosis present

## 2014-09-29 DIAGNOSIS — R52 Pain, unspecified: Secondary | ICD-10-CM

## 2014-09-29 HISTORY — DX: Essential (primary) hypertension: I10

## 2014-09-29 LAB — URINALYSIS, ROUTINE W REFLEX MICROSCOPIC
Bilirubin Urine: NEGATIVE
GLUCOSE, UA: NEGATIVE mg/dL
KETONES UR: NEGATIVE mg/dL
Leukocytes, UA: NEGATIVE
NITRITE: NEGATIVE
PH: 5 (ref 5.0–8.0)
PROTEIN: 30 mg/dL — AB
Specific Gravity, Urine: 1.017 (ref 1.005–1.030)
Urobilinogen, UA: 0.2 mg/dL (ref 0.0–1.0)

## 2014-09-29 LAB — URINE MICROSCOPIC-ADD ON

## 2014-09-29 MED ORDER — ONDANSETRON HCL 4 MG/2ML IJ SOLN
4.0000 mg | Freq: Once | INTRAMUSCULAR | Status: AC
  Administered 2014-09-29: 4 mg via INTRAVENOUS
  Filled 2014-09-29: qty 2

## 2014-09-29 MED ORDER — SODIUM CHLORIDE 0.9 % IV SOLN
INTRAVENOUS | Status: DC
  Administered 2014-09-29: 22:00:00 via INTRAVENOUS

## 2014-09-29 MED ORDER — HYDROMORPHONE HCL 1 MG/ML IJ SOLN
1.0000 mg | Freq: Once | INTRAMUSCULAR | Status: AC
  Administered 2014-09-29: 1 mg via INTRAVENOUS
  Filled 2014-09-29: qty 1

## 2014-09-29 MED ORDER — HYDROMORPHONE HCL 1 MG/ML IJ SOLN
1.0000 mg | Freq: Once | INTRAMUSCULAR | Status: AC
Start: 2014-09-29 — End: 2014-09-29
  Administered 2014-09-29: 1 mg via INTRAVENOUS
  Filled 2014-09-29: qty 1

## 2014-09-29 NOTE — ED Provider Notes (Signed)
CSN: [id number]     Arrival date & time 09/29/14  2100 History   First MD Initiated Contact with Patient 09/29/14 2116     Chief Complaint  Patient presents with  . Abdominal Pain     (Consider location/radiation/quality/duration/timing/severity/associated sxs/prior Treatment) HPI Comments: Because of the emergent nature of the patient's condition the patient's daughter was used as an Equities trader. Patient was comfortable with this and does have limited [place].  Patient here complaining of sudden onset of right lower quadrant pain that began this evening. Symptoms are worse after voiding. Denies any dark urine. No fever or chills. He's had some nausea and nonbilious vomiting. Patient states is hard to find a spot is comfortable. Denies any prior history of renal colic. Denies any testicular pain or swelling. No syncope or near-syncope.. Symptoms colicky and persistent. No treatment use prior to arrival  Patient is a 69 y.o. male presenting with abdominal pain. The history is provided by the patient. The history is limited by a language barrier.  Abdominal Pain   Past Medical History  Diagnosis Date  . Hypertension    Past Surgical History  Procedure Laterality Date  . Lumbar disc surgery  2011    in [place]   . I&d extremity Left 08/22/2013    Procedure: IRRIGATION AND DEBRIDEMENT EXTREMITY;  Surgeon: Tami Ribas, MD;  Location: [facility] OR;  Service: Orthopedics;  Laterality: Left;  left ring finger  . Percutaneous pinning Left 08/22/2013    Procedure: PERCUTANEOUS PINNING EXTREMITY;  Surgeon: Tami Ribas, MD;  Location: [facility] OR;  Service: Orthopedics;  Laterality: Left;  left ring finger  . Appendectomy     No family history on file. History  Substance Use Topics  . Smoking status: Never Smoker   . Smokeless tobacco: Never Used  . Alcohol Use: No    Review of Systems  Gastrointestinal: Positive for abdominal pain.  All other systems reviewed and are negative.     Allergies   Review of patient's allergies indicates no known allergies.  Home Medications   Prior to Admission medications   Medication Sig Start Date End Date Taking? Authorizing Provider  loperamide (IMODIUM A-D) 2 MG tablet 2 now and one hourly prn diarrhea.  Max 8 tabs in 24 hours 07/11/14   Carmelina Dane, MD  ondansetron (ZOFRAN-ODT) 8 MG disintegrating tablet Take 1 tablet (8 mg total) by mouth every 8 (eight) hours as needed for nausea. 07/11/14   Carmelina Dane, MD   BP 166/71 mmHg  Pulse 73  Temp(Src) 98.7 F (37.1 C)  Resp 24  SpO2 100% Physical Exam  Constitutional: He is oriented to person, place, and time. He appears well-developed and well-nourished.  Non-toxic appearance. No distress.  HENT:  Head: Normocephalic and atraumatic.  Eyes: Conjunctivae, EOM and lids are normal. Pupils are equal, round, and reactive to light.  Neck: Normal range of motion. Neck supple. No tracheal deviation present. No thyroid mass present.  Cardiovascular: Normal rate, regular rhythm and normal heart sounds.  Exam reveals no gallop.   No murmur heard. Pulmonary/Chest: Effort normal and breath sounds normal. No stridor. No respiratory distress. He has no decreased breath sounds. He has no wheezes. He has no rhonchi. He has no rales.  Abdominal: Soft. Normal appearance and bowel sounds are normal. He exhibits no distension. There is no tenderness. There is CVA tenderness. There is no rigidity, no rebound and no guarding.  Musculoskeletal: Normal range of motion. He exhibits no edema or tenderness.  Neurological: He is alert and oriented to person, place, and time. He has normal strength. No cranial nerve deficit or sensory deficit. GCS eye subscore is 4. GCS verbal subscore is 5. GCS motor subscore is 6.  Skin: Skin is warm and dry. No abrasion and no rash noted.  Psychiatric: He has a normal mood and affect. His speech is normal and behavior is normal.  Nursing note and vitals reviewed.   ED  Course  Procedures (including critical care time) Labs Review Labs Reviewed  URINALYSIS, ROUTINE W REFLEX MICROSCOPIC (NOT [facility])    Imaging Review No results found.   EKG Interpretation None      MDM   Final diagnoses:  Pain    Patient given 2 doses of IV hydromorphone and feels better. Patient with 5 mm kidney stone on the right side will be given referral to urology    Lorre NickAnthony Rasheedah Reis, MD 09/30/14 0001

## 2014-09-29 NOTE — ED Notes (Signed)
Pt c/o that it was difficult to urinate

## 2014-09-29 NOTE — ED Notes (Signed)
Pt c/o sudden onset of RLQ abd pain this pm; pt states that the pain got worse after voiding; pt c/o N/V; family reports vomiting clear liquid several times; pt restless and constantly moving around in triage

## 2014-09-30 MED ORDER — ONDANSETRON 8 MG PO TBDP: 8.0000 mg | ORAL_TABLET | Freq: Three times a day (TID) | ORAL | Status: DC | PRN

## 2014-09-30 MED ORDER — TAMSULOSIN HCL 0.4 MG PO CAPS: 0.4000 mg | ORAL_CAPSULE | Freq: Every day | ORAL | Status: DC

## 2014-09-30 MED ORDER — OXYCODONE-ACETAMINOPHEN 5-325 MG PO TABS: 1.0000 | ORAL_TABLET | ORAL | Status: DC | PRN

## 2014-09-30 NOTE — Discharge Instructions (Signed)

## 2015-09-20 ENCOUNTER — Ambulatory Visit (INDEPENDENT_AMBULATORY_CARE_PROVIDER_SITE_OTHER): Payer: Commercial Managed Care - PPO | Admitting: Family Medicine

## 2015-09-20 VITALS — BP 124/80 | HR 80 | Resp 16 | Ht 64.0 in | Wt 148.8 lb

## 2015-09-20 DIAGNOSIS — R21 Rash and other nonspecific skin eruption: Secondary | ICD-10-CM | POA: Diagnosis not present

## 2015-09-20 DIAGNOSIS — L850 Acquired ichthyosis: Secondary | ICD-10-CM | POA: Diagnosis not present

## 2015-09-20 DIAGNOSIS — L853 Xerosis cutis: Secondary | ICD-10-CM

## 2015-09-20 DIAGNOSIS — B356 Tinea cruris: Secondary | ICD-10-CM | POA: Diagnosis not present

## 2015-09-20 MED ORDER — TRIAMCINOLONE ACETONIDE 0.1 % EX CREA: 1.0000 "application " | TOPICAL_CREAM | Freq: Two times a day (BID) | CUTANEOUS | Status: DC

## 2015-09-20 MED ORDER — CLOTRIMAZOLE 1 % EX CREA: 1.0000 "application " | TOPICAL_CREAM | Freq: Two times a day (BID) | CUTANEOUS | Status: DC

## 2015-09-20 NOTE — Progress Notes (Addendum)
By signing my name below I, Shelah Lewandowsky, attest that this documentation has been prepared under the direction and in the presence of Shade Flood, MD. Electonically Signed. Shelah Lewandowsky, Scribe 09/20/2015 at 10:14 AM  Subjective:    Patient ID: Nathan Bruce, male    DOB: 12/06/1945, 70 y.o.   MRN: [id number]  Chief Complaint  Patient presents with  . Rash    Pt states whole body x2-3 months     HPI Nathan Bruce is a 70 y.o. male who presents to the [facility] complaining of rash for the past 2-3 months. Rash is pruritic and started on rt leg and has moved up leg and onto lower back and left leg and arms. Rash has been on his back for the past 2 months. Pt denies using any medication. Pt denies any pain. Rash becomes numb after scratching too much.   Rash is worsened when he eats sea food.  Pt's daughter present and translating for pt.   There are no active problems to display for this patient.  Past Medical History  Diagnosis Date  . Hypertension    Past Surgical History  Procedure Laterality Date  . Lumbar disc surgery  2011    in [place]   . I&d extremity Left 08/22/2013    Procedure: IRRIGATION AND DEBRIDEMENT EXTREMITY;  Surgeon: Tami Ribas, MD;  Location: [facility] OR;  Service: Orthopedics;  Laterality: Left;  left ring finger  . Percutaneous pinning Left 08/22/2013    Procedure: PERCUTANEOUS PINNING EXTREMITY;  Surgeon: Tami Ribas, MD;  Location: [facility] OR;  Service: Orthopedics;  Laterality: Left;  left ring finger  . Appendectomy     No Known Allergies Prior to Admission medications   Medication Sig Start Date End Date Taking? Authorizing Provider  ibuprofen (ADVIL,MOTRIN) 200 MG tablet Take 400 mg by mouth every 6 (six) hours as needed for moderate pain. Reported on 09/20/2015    Historical Provider, MD  loperamide (IMODIUM A-D) 2 MG tablet 2 now and one hourly prn diarrhea.  Max 8 tabs in 24 hours Patient not taking: Reported on 09/20/2015 07/11/14    Carmelina Dane, MD  ondansetron (ZOFRAN ODT) 8 MG disintegrating tablet Take 1 tablet (8 mg total) by mouth every 8 (eight) hours as needed for nausea or vomiting. Patient not taking: Reported on 09/20/2015 09/30/14   Lorre Nick, MD  oxyCODONE-acetaminophen (PERCOCET/ROXICET) 5-325 MG per tablet Take 1-2 tablets by mouth every 4 (four) hours as needed for severe pain. Patient not taking: Reported on 09/20/2015 09/30/14   Lorre Nick, MD  tamsulosin (FLOMAX) 0.4 MG CAPS capsule Take 1 capsule (0.4 mg total) by mouth daily. Patient not taking: Reported on 09/20/2015 09/30/14   Lorre Nick, MD   Social History   Social History  . Marital Status: Married    Spouse Name: N/A  . Number of Children: N/A  . Years of Education: N/A   Occupational History  . Not on file.   Social History Main Topics  . Smoking status: Never Smoker   . Smokeless tobacco: Never Used  . Alcohol Use: No  . Drug Use: No  . Sexual Activity: Not on file   Other Topics Concern  . Not on file   Social History Narrative      Review of Systems  Skin: Positive for rash.  Neurological: Positive for numbness.       Objective:   Physical Exam  Constitutional: He is oriented  to person, place, and time. He appears well-developed and well-nourished. No distress.  HENT:  Head: Normocephalic and atraumatic.  Mouth/Throat: Oropharynx is clear and moist. No posterior oropharyngeal erythema.  No oral lesions present.  Eyes: Conjunctivae are normal. Pupils are equal, round, and reactive to light.  Neck: Neck supple.  Cardiovascular: Normal rate, regular rhythm and normal heart sounds.  Exam reveals no gallop.   No murmur heard. Pulmonary/Chest: Effort normal and breath sounds normal. He has no decreased breath sounds. He has no wheezes. He has no rhonchi. He has no rales.  Musculoskeletal: Normal range of motion.  Neurological: He is alert and oriented to person, place, and time.  Skin: Skin is warm and dry.    Pt has dry skin on upper and lower extremities bilat. There is scattered patches of excoriation and hyperpigmentation on Pt's R>L leg, dorsum of left foot, inter cleft/buttocks, inner thigh, and scrotum.  Psychiatric: He has a normal mood and affect. His behavior is normal.  Nursing note and vitals reviewed.    Filed Vitals:   09/20/15 0919  BP: 124/80  Pulse: 80  Resp: 16  Height: 5\' 4"  (1.626 m)  Weight: 148 lb 12.8 oz (67.495 kg)  SpO2: 98%       Assessment & Plan:   Nathan Bruce is a 70 y.o. male Tinea cruris - Plan: clotrimazole (LOTRIMIN) 1 % cream  Dry skin dermatitis - Plan: triamcinolone cream (KENALOG) 0.1 %  Rash and nonspecific skin eruption  Possible combination of dry skin dermatitis, and tinea in groin, but also association with seafood - may have shellfish allergy.   -avoid shellfish, zyrtc or claritin otc, lotion and dry skin care.   -triamcinolone as needed for focal itching areas.   -clotrimazole to groin if persistent itching in groin and buttocks.   -rtc precautions or refer to allergist or possibly dermatology if persists.    Meds ordered this encounter  Medications  . clotrimazole (LOTRIMIN) 1 % cream    Sig: Apply 1 application topically 2 (two) times daily. To groin/buttocks rash.    Dispense:  30 g    Refill:  0  . triamcinolone cream (KENALOG) 0.1 %    Sig: Apply 1 application topically 2 (two) times daily.    Dispense:  30 g    Refill:  0   Patient Instructions       IF you received an x-ray today, you will receive an invoice from [facility]. Please contact [facility] at [phone] with questions or concerns regarding your invoice.   IF you received labwork today, you will receive an invoice from [facility]. Please contact [facility] at [phone] with questions or concerns regarding your invoice.   Our billing staff will not be able to assist you with questions regarding bills from  these companies.  You will be contacted with the lab results as soon as they are available. The fastest way to get your results is to activate your My Chart account. Instructions are located on the last page of this paperwork. If you have not heard from us regarding the results in 2 weeks, please contact this office.     Drink at least 64 ounces of water daily. Consider a humidifier for the room where you sleep. Bathe once daily. Avoid using HOT water, as it dries skin.  Avoid deodorant soaps (Dial is the worst!) and stick with gentle cleansers  (like Cetaphil Liquid Cleanser). After bathing, dry off completely, then apply a  thick emollient cream (like Aveeno or Eucerin). Apply the cream twice daily, or more!  Traimcinolone to itching areas as needed.   In groin, buttock area - can use clotrimazole for possible "jock itch".   Zyrtec or claritin once per day, and avoid shellfish.   If not improved in 2 weeks - return for recheck or can refer you to allergist.   Rash A rash is a change in the color or texture of the skin. There are many different types of rashes. You may have other problems that accompany your rash. CAUSES   Infections.  Allergic reactions. This can include allergies to pets or foods.  Certain medicines.  Exposure to certain chemicals, soaps, or cosmetics.  Heat.  Exposure to poisonous plants.  Tumors, both cancerous and noncancerous. SYMPTOMS   Redness.  Scaly skin.  Itchy skin.  Dry or cracked skin.  Bumps.  Blisters.  Pain. DIAGNOSIS  Your caregiver may do a physical exam to determine what type of rash you have. A skin sample (biopsy) may be taken and examined under a microscope. TREATMENT  Treatment depends on the type of rash you have. Your caregiver may prescribe certain medicines. For serious conditions, you may need to see a skin doctor (dermatologist). HOME CARE INSTRUCTIONS   Avoid the substance that caused your rash.  Do not  scratch your rash. This can cause infection.  You may take cool baths to help stop itching.  Only take over-the-counter or prescription medicines as directed by your caregiver.  Keep all follow-up appointments as directed by your caregiver. SEEK IMMEDIATE MEDICAL CARE IF:  You have increasing pain, swelling, or redness.  You have a fever.  You have new or severe symptoms.  You have body aches, diarrhea, or vomiting.  Your rash is not better after 3 days. MAKE SURE YOU:  Understand these instructions.  Will watch your condition.  Will get help right away if you are not doing well or get worse.   This information is not intended to replace advice given to you by your health care provider. Make sure you discuss any questions you have with your health care provider.   Document Released: 03/30/2002 Document Revised: 04/30/2014 Document Reviewed: 08/25/2014 Elsevier Interactive Patient Education 2016 Elsevier Inc.   Jock Itch Jock itch (tinea cruris) is a fungal infection of the skin in the groin area. It is sometimes called ringworm, even though it is not caused by worms. It is caused by a fungus, which is a type of germ that thrives in dark, damp places. Jock itch causes a rash and itching in the groin and upper thigh area. It usually goes away in 2-3 weeks with treatment. CAUSES The fungus that causes jock itch may be spread by:  Touching a fungus infection elsewhere on your body--such as athlete's foot--and then touching your groin area.  Sharing towels or clothing with an infected person. RISK FACTORS Jock itch is most common in men and adolescent boys. This condition is more likely to develop from:  Being in hot, humid climates.  Wearing tight-fitting clothing or wet bathing suits for long periods of time.  Participating in sports.  Being overweight.  Having diabetes. SYMPTOMS Symptoms of jock itch may include:  A red, pink, or brown rash in the groin area. The  rash may spread to the thighs, anus, and buttocks.  Dry and scaly skin on or around the rash.  Itchiness. DIAGNOSIS Most often, a health care provider can make the diagnosis by looking  at your rash. Sometimes, a scraping of the infected skin will be taken. This sample may be tested by looking at it under a microscope or by trying to grow the fungus from the sample (culture).  TREATMENT Treatment for this condition may include:  Antifungal medicine to kill the fungus. This may be in various forms:  Skin cream or ointment.  Medicine taken by mouth.  Skin cream or ointment to reduce the itching.  Compresses or medicated powders to dry the infected skin. HOME CARE INSTRUCTIONS  Take medicines only as directed by your health care provider. Apply skin creams or ointments exactly as directed.  Wear loose-fitting clothing.  Men should wear cotton boxer shorts.  Women should wear cotton underwear.  Change your underwear every day to keep your groin dry.  Avoid hot baths.  Dry your groin area well after bathing.  Use a separate towel to dry your groin area. This will help to prevent a spreading of the infection to other areas of your body.  Do not scratch the affected area.  Do not share towels with other people. SEEK MEDICAL CARE IF:  Your rash does not improve or it gets worse after 2 weeks of treatment.  Your rash is spreading.  Your rash returns after treatment is finished.  You have a fever.  You have redness, swelling, or pain in the area around your rash.  You have fluid, blood, or pus coming from your rash.  Your have your rash for more than 4 weeks.   This information is not intended to replace advice given to you by your health care provider. Make sure you discuss any questions you have with your health care provider.   Document Released: 03/30/2002 Document Revised: 04/30/2014 Document Reviewed: 01/19/2014 Elsevier Interactive Patient Education [employer].        I personally performed the services described in this documentation, which was scribed in my presence. The recorded information has been reviewed and considered, and addended by me as needed.   Signed,   Meredith Staggers, MD [facility].  09/20/2015 12:22 PM

## 2015-09-20 NOTE — Patient Instructions (Addendum)
IF you received an x-ray today, you will receive an invoice from [facility]. Please contact [facility] at [phone] with questions or concerns regarding your invoice.   IF you received labwork today, you will receive an invoice from [facility]. Please contact [facility] at [phone] with questions or concerns regarding your invoice.   Our billing staff will not be able to assist you with questions regarding bills from these companies.  You will be contacted with the lab results as soon as they are available. The fastest way to get your results is to activate your My Chart account. Instructions are located on the last page of this paperwork. If you have not heard from us regarding the results in 2 weeks, please contact this office.     Drink at least 64 ounces of water daily. Consider a humidifier for the room where you sleep. Bathe once daily. Avoid using HOT water, as it dries skin.  Avoid deodorant soaps (Dial is the worst!) and stick with gentle cleansers  (like Cetaphil Liquid Cleanser). After bathing, dry off completely, then apply a thick emollient cream (like Aveeno or Eucerin). Apply the cream twice daily, or more!  Traimcinolone to itching areas as needed.   In groin, buttock area - can use clotrimazole for possible "jock itch".   Zyrtec or claritin once per day, and avoid shellfish.   If not improved in 2 weeks - return for recheck or can refer you to allergist.   Rash A rash is a change in the color or texture of the skin. There are many different types of rashes. You may have other problems that accompany your rash. CAUSES   Infections.  Allergic reactions. This can include allergies to pets or foods.  Certain medicines.  Exposure to certain chemicals, soaps, or cosmetics.  Heat.  Exposure to poisonous plants.  Tumors, both cancerous and noncancerous. SYMPTOMS   Redness.  Scaly skin.  Itchy  skin.  Dry or cracked skin.  Bumps.  Blisters.  Pain. DIAGNOSIS  Your caregiver may do a physical exam to determine what type of rash you have. A skin sample (biopsy) may be taken and examined under a microscope. TREATMENT  Treatment depends on the type of rash you have. Your caregiver may prescribe certain medicines. For serious conditions, you may need to see a skin doctor (dermatologist). HOME CARE INSTRUCTIONS   Avoid the substance that caused your rash.  Do not scratch your rash. This can cause infection.  You may take cool baths to help stop itching.  Only take over-the-counter or prescription medicines as directed by your caregiver.  Keep all follow-up appointments as directed by your caregiver. SEEK IMMEDIATE MEDICAL CARE IF:  You have increasing pain, swelling, or redness.  You have a fever.  You have new or severe symptoms.  You have body aches, diarrhea, or vomiting.  Your rash is not better after 3 days. MAKE SURE YOU:  Understand these instructions.  Will watch your condition.  Will get help right away if you are not doing well or get worse.   This information is not intended to replace advice given to you by your health care provider. Make sure you discuss any questions you have with your health care provider.   Document Released: 03/30/2002 Document Revised: 04/30/2014 Document Reviewed: 08/25/2014 Elsevier Interactive Patient Education 2016 Elsevier Inc.   Jock Itch Jock itch (tinea cruris) is a fungal infection of the skin in the groin area. It is  sometimes called ringworm, even though it is not caused by worms. It is caused by a fungus, which is a type of germ that thrives in dark, damp places. Jock itch causes a rash and itching in the groin and upper thigh area. It usually goes away in 2-3 weeks with treatment. CAUSES The fungus that causes jock itch may be spread by:  Touching a fungus infection elsewhere on your body--such as athlete's  foot--and then touching your groin area.  Sharing towels or clothing with an infected person. RISK FACTORS Jock itch is most common in men and adolescent boys. This condition is more likely to develop from:  Being in hot, humid climates.  Wearing tight-fitting clothing or wet bathing suits for long periods of time.  Participating in sports.  Being overweight.  Having diabetes. SYMPTOMS Symptoms of jock itch may include:  A red, pink, or brown rash in the groin area. The rash may spread to the thighs, anus, and buttocks.  Dry and scaly skin on or around the rash.  Itchiness. DIAGNOSIS Most often, a health care provider can make the diagnosis by looking at your rash. Sometimes, a scraping of the infected skin will be taken. This sample may be tested by looking at it under a microscope or by trying to grow the fungus from the sample (culture).  TREATMENT Treatment for this condition may include:  Antifungal medicine to kill the fungus. This may be in various forms:  Skin cream or ointment.  Medicine taken by mouth.  Skin cream or ointment to reduce the itching.  Compresses or medicated powders to dry the infected skin. HOME CARE INSTRUCTIONS  Take medicines only as directed by your health care provider. Apply skin creams or ointments exactly as directed.  Wear loose-fitting clothing.  Men should wear cotton boxer shorts.  Women should wear cotton underwear.  Change your underwear every day to keep your groin dry.  Avoid hot baths.  Dry your groin area well after bathing.  Use a separate towel to dry your groin area. This will help to prevent a spreading of the infection to other areas of your body.  Do not scratch the affected area.  Do not share towels with other people. SEEK MEDICAL CARE IF:  Your rash does not improve or it gets worse after 2 weeks of treatment.  Your rash is spreading.  Your rash returns after treatment is finished.  You have a  fever.  You have redness, swelling, or pain in the area around your rash.  You have fluid, blood, or pus coming from your rash.  Your have your rash for more than 4 weeks.   This information is not intended to replace advice given to you by your health care provider. Make sure you discuss any questions you have with your health care provider.   Document Released: 03/30/2002 Document Revised: 04/30/2014 Document Reviewed: 01/19/2014 Elsevier Interactive Patient Education [employer]

## 2016-01-02 ENCOUNTER — Ambulatory Visit (INDEPENDENT_AMBULATORY_CARE_PROVIDER_SITE_OTHER): Payer: Commercial Managed Care - PPO | Admitting: Family Medicine

## 2016-01-02 VITALS — BP 130/70 | HR 61 | Temp 98.0°F | Resp 18 | Ht 64.0 in | Wt 154.6 lb

## 2016-01-02 DIAGNOSIS — Z79899 Other long term (current) drug therapy: Secondary | ICD-10-CM

## 2016-01-02 DIAGNOSIS — R21 Rash and other nonspecific skin eruption: Secondary | ICD-10-CM | POA: Diagnosis not present

## 2016-01-02 DIAGNOSIS — L509 Urticaria, unspecified: Secondary | ICD-10-CM | POA: Diagnosis not present

## 2016-01-02 DIAGNOSIS — L850 Acquired ichthyosis: Secondary | ICD-10-CM | POA: Diagnosis not present

## 2016-01-02 DIAGNOSIS — L853 Xerosis cutis: Secondary | ICD-10-CM

## 2016-01-02 DIAGNOSIS — L819 Disorder of pigmentation, unspecified: Secondary | ICD-10-CM

## 2016-01-02 LAB — GLUCOSE, POCT (MANUAL RESULT ENTRY): POC Glucose: 121 mg/dl — AB (ref 70–99)

## 2016-01-02 MED ORDER — PREDNISONE 20 MG PO TABS: 40.0000 mg | ORAL_TABLET | Freq: Every day | ORAL | 0 refills | Status: DC

## 2016-01-02 MED ORDER — TRIAMCINOLONE ACETONIDE 0.1 % EX CREA: 1.0000 "application " | TOPICAL_CREAM | Freq: Two times a day (BID) | CUTANEOUS | 0 refills | Status: DC

## 2016-01-02 NOTE — Progress Notes (Signed)
By signing my name below, I, Mesha Guinyard, attest that this documentation has been prepared under the direction and in the presence of Meredith Staggers, MD.  Electronically Signed: Arvilla Market, Medical Scribe. 01/02/16. 10:33 AM.  Subjective:    Patient ID: Nathan Bruce, male    DOB: 03-10-1946, 70 y.o.   MRN: [id number]  HPI  Chief Complaint  Patient presents with  . Follow-up    rash on legs and feet    HPI Comments: Nathan Bruce is a 70 y.o. male who presents to the [facility] complaining of rash follow-up. Pt doesn't speak English so his daughter translated for him. Pt was last seen 09/20/2015 for tinea cruris, treated with topical clotrimazole. Treated for dry skin and treated with Kenolog cream,  Pt's reports itchy rash that started on his rt leg rash, and spread to his full body started a month ago. Pt describes the sensation as as a warm feeling before the itchiness sets in. Pt has been using GoldBond powder and occasionally uses Kenalog for relief to his symptoms, but he prefers the Gold Bond powder. Pt has been using Aveeno lotion QD. Pt denies using any new creams, or lotions on his skin. Pt denies PMHx of DM. Pt denies penial rash, and mouth sores.  Denies any new supplements or herbal medications, desires other creams or topical lotions.  There are no active problems to display for this patient.  Past Medical History:  Diagnosis Date  . Hypertension    Past Surgical History:  Procedure Laterality Date  . APPENDECTOMY    . I&D EXTREMITY Left 08/22/2013   Procedure: IRRIGATION AND DEBRIDEMENT EXTREMITY;  Surgeon: Tami Ribas, MD;  Location: [facility] OR;  Service: Orthopedics;  Laterality: Left;  left ring finger  . LUMBAR DISC SURGERY  2011   in [place]   . PERCUTANEOUS PINNING Left 08/22/2013   Procedure: PERCUTANEOUS PINNING EXTREMITY;  Surgeon: Tami Ribas, MD;  Location: [facility] OR;  Service: Orthopedics;  Laterality: Left;  left ring finger   No Known  Allergies Prior to Admission medications   Medication Sig Start Date End Date Taking? Authorizing Provider  clotrimazole (LOTRIMIN) 1 % cream Apply 1 application topically 2 (two) times daily. To groin/buttocks rash. Patient not taking: Reported on 01/02/2016 09/20/15   Shade Flood, MD  ibuprofen (ADVIL,MOTRIN) 200 MG tablet Take 400 mg by mouth every 6 (six) hours as needed for moderate pain. Reported on 09/20/2015    Historical Provider, MD  triamcinolone cream (KENALOG) 0.1 % Apply 1 application topically 2 (two) times daily. Patient not taking: Reported on 01/02/2016 09/20/15   Shade Flood, MD   Social History   Social History  . Marital status: Married    Spouse name: N/A  . Number of children: N/A  . Years of education: N/A   Occupational History  . Not on file.   Social History Main Topics  . Smoking status: Never Smoker  . Smokeless tobacco: Never Used  . Alcohol use No  . Drug use: No  . Sexual activity: Not on file   Other Topics Concern  . Not on file   Social History Narrative  . No narrative on file   Depression screen [facility] 2/9 01/02/2016 09/20/2015  Decreased Interest 0 0  Down, Depressed, Hopeless 0 0  PHQ - 2 Score 0 0   Review of Systems  Constitutional: Negative for fatigue and fever.  Skin: Positive for rash.   Objective:  Physical Exam  Constitutional: He appears well-developed and well-nourished. No distress.  HENT:  Head: Normocephalic and atraumatic.  Mouth/Throat: No oral lesions.  Eyes: Conjunctivae are normal.  Neck: Neck supple.  Cardiovascular: Normal rate, regular rhythm and normal heart sounds.  Exam reveals no friction rub.   No murmur heard. Pulmonary/Chest: Effort normal and breath sounds normal. No respiratory distress. He has no wheezes. He has no rales.  Neurological: He is alert.  Skin: Skin is warm and dry. Rash noted.  Excoiration in linear scratches R>L on upper and lower back, as well as rt upper buttock Rt anterior  leg hyperpigmented patch 4x2cm on the anterior aspect and a 2 small patches that are approx 2x2 cm on the anterior lower leg Dry area on the left foot Slight hyperpigmentation excoriated patch on the medial left knee  Psychiatric: He has a normal mood and affect. His behavior is normal.  Nursing note and vitals reviewed.  BP 130/70   Pulse 61   Temp 98 F (36.7 C) (Oral)   Resp 18   Ht 5\' 4"  (1.626 m)   Wt 154 lb 9.6 oz (70.1 kg)   SpO2 99%   BMI 26.54 kg/m    Results for orders placed or performed in visit on 01/02/16  POCT glucose (manual entry)  Result Value Ref Range   POC Glucose 121 (A) 70 - 99 mg/dl   Assessment & Plan:    Nathan Bruce is a 70 y.o. male Rash and nonspecific skin eruption - Plan: Ambulatory referral to Allergy  Dry skin dermatitis - Plan: triamcinolone cream (KENALOG) 0.1 %  Hyperpigmentation  High risk medication use - Plan: POCT glucose (manual entry), predniSONE (DELTASONE) 20 MG tablet  Urticarial rash - Plan: Ambulatory referral to Allergy   Initially thought to be dry skin dermatitis, but now urticarial/dermatographia some lesions on upper back. Will start prednisone 20 mg 2 tabs daily 3 days. Sometimes a low cream to affected areas, Zyrtec daily when necessary Eucerin cream, refer to allergist. RTC precautions if not improving into this week. Sooner if worse.  Meds ordered this encounter  Medications  . triamcinolone cream (KENALOG) 0.1 %    Sig: Apply 1 application topically 2 (two) times daily.    Dispense:  30 g    Refill:  0  . predniSONE (DELTASONE) 20 MG tablet    Sig: Take 2 tablets (40 mg total) by mouth daily with breakfast.    Dispense:  6 tablet    Refill:  0   Patient Instructions   Start prednisone to treat hives/skin rash. Change lotion to Eucerin once or twice per day. Do not use other over-the-counter lotions or skin care treatments. Triamcinolone cream up to twice per day as needed for itching areas. Start Zyrtec 1 pill  once per day. I will refer you to an allergist to determine what is causing this rash. If you're not improving by the end of this week, return for recheck. Return to the clinic or go to the nearest emergency room if any of your symptoms worsen or new symptoms occur.    Hives Hives are itchy, red, swollen areas of the skin. They can vary in size and location on your body. Hives can come and go for hours or several days (acute hives) or for several weeks (chronic hives). Hives do not spread from person to person (noncontagious). They may get worse with scratching, exercise, and emotional stress. CAUSES   Allergic reaction to food, additives, or  drugs.  Infections, including the common cold.  Illness, such as vasculitis, lupus, or thyroid disease.  Exposure to sunlight, heat, or cold.  Exercise.  Stress.  Contact with chemicals. SYMPTOMS   Red or white swollen patches on the skin. The patches may change size, shape, and location quickly and repeatedly.  Itching.  Swelling of the hands, feet, and face. This may occur if hives develop deeper in the skin. DIAGNOSIS  Your caregiver can usually tell what is wrong by performing a physical exam. Skin or blood tests may also be done to determine the cause of your hives. In some cases, the cause cannot be determined. TREATMENT  Mild cases usually get better with medicines such as antihistamines. Severe cases may require an emergency epinephrine injection. If the cause of your hives is known, treatment includes avoiding that trigger.  HOME CARE INSTRUCTIONS   Avoid causes that trigger your hives.  Take antihistamines as directed by your caregiver to reduce the severity of your hives. Non-sedating or low-sedating antihistamines are usually recommended. Do not drive while taking an antihistamine.  Take any other medicines prescribed for itching as directed by your caregiver.  Wear loose-fitting clothing.  Keep all follow-up appointments as  directed by your caregiver. SEEK MEDICAL CARE IF:   You have persistent or severe itching that is not relieved with medicine.  You have painful or swollen joints. SEEK IMMEDIATE MEDICAL CARE IF:   You have a fever.  Your tongue or lips are swollen.  You have trouble breathing or swallowing.  You feel tightness in the throat or chest.  You have abdominal pain. These problems may be the first sign of a life-threatening allergic reaction. Call your local emergency services (911 in [place] MAKE SURE YOU:   Understand these instructions.  Will watch your condition.  Will get help right away if you are not doing well or get worse.   This information is not intended to replace advice given to you by your health care provider. Make sure you discuss any questions you have with your health care provider.   Document Released: 04/09/2005 Document Revised: 04/14/2013 Document Reviewed: 07/03/2011 Elsevier Interactive Patient Education [employer]   IF you received an x-ray today, you will receive an invoice from [facility]. Please contact [facility] at [phone] with questions or concerns regarding your invoice.   IF you received labwork today, you will receive an invoice from [facility]. Please contact [facility] at [phone] with questions or concerns regarding your invoice.   Our billing staff will not be able to assist you with questions regarding bills from these companies.  You will be contacted with the lab results as soon as they are available. The fastest way to get your results is to activate your My Chart account. Instructions are located on the last page of this paperwork. If you have not heard from [place] regarding the results in 2 weeks, please contact this office.        I personally performed the services described in this documentation, which was scribed in my presence. The recorded information has been reviewed  and considered, and addended by me as needed.   Signed,   Meredith Staggers, MD [facility].  01/02/16 11:09 AM

## 2016-01-02 NOTE — Patient Instructions (Addendum)
Start prednisone to treat hives/skin rash. Change lotion to Eucerin once or twice per day. Do not use other over-the-counter lotions or skin care treatments. Triamcinolone cream up to twice per day as needed for itching areas. Start Zyrtec 1 pill once per day. I will refer you to an allergist to determine what is causing this rash. If you're not improving by the end of this week, return for recheck. Return to the clinic or go to the nearest emergency room if any of your symptoms worsen or new symptoms occur.    Hives Hives are itchy, red, swollen areas of the skin. They can vary in size and location on your body. Hives can come and go for hours or several days (acute hives) or for several weeks (chronic hives). Hives do not spread from person to person (noncontagious). They may get worse with scratching, exercise, and emotional stress. CAUSES   Allergic reaction to food, additives, or drugs.  Infections, including the common cold.  Illness, such as vasculitis, lupus, or thyroid disease.  Exposure to sunlight, heat, or cold.  Exercise.  Stress.  Contact with chemicals. SYMPTOMS   Red or white swollen patches on the skin. The patches may change size, shape, and location quickly and repeatedly.  Itching.  Swelling of the hands, feet, and face. This may occur if hives develop deeper in the skin. DIAGNOSIS  Your caregiver can usually tell what is wrong by performing a physical exam. Skin or blood tests may also be done to determine the cause of your hives. In some cases, the cause cannot be determined. TREATMENT  Mild cases usually get better with medicines such as antihistamines. Severe cases may require an emergency epinephrine injection. If the cause of your hives is known, treatment includes avoiding that trigger.  HOME CARE INSTRUCTIONS   Avoid causes that trigger your hives.  Take antihistamines as directed by your caregiver to reduce the severity of your hives. Non-sedating or  low-sedating antihistamines are usually recommended. Do not drive while taking an antihistamine.  Take any other medicines prescribed for itching as directed by your caregiver.  Wear loose-fitting clothing.  Keep all follow-up appointments as directed by your caregiver. SEEK MEDICAL CARE IF:   You have persistent or severe itching that is not relieved with medicine.  You have painful or swollen joints. SEEK IMMEDIATE MEDICAL CARE IF:   You have a fever.  Your tongue or lips are swollen.  You have trouble breathing or swallowing.  You feel tightness in the throat or chest.  You have abdominal pain. These problems may be the first sign of a life-threatening allergic reaction. Call your local emergency services (911 in [place] MAKE SURE YOU:   Understand these instructions.  Will watch your condition.  Will get help right away if you are not doing well or get worse.   This information is not intended to replace advice given to you by your health care provider. Make sure you discuss any questions you have with your health care provider.   Document Released: 04/09/2005 Document Revised: 04/14/2013 Document Reviewed: 07/03/2011 Elsevier Interactive Patient Education [employer]   IF you received an x-ray today, you will receive an invoice from [facility]. Please contact [facility] at [phone] with questions or concerns regarding your invoice.   IF you received labwork today, you will receive an invoice from [facility]. Please contact [facility] at [phone] with questions or concerns regarding your invoice.   Our billing staff will not  be able to assist you with questions regarding bills from these companies.  You will be contacted with the lab results as soon as they are available. The fastest way to get your results is to activate your My Chart account. Instructions are located on the last page of this paperwork. If  you have not heard from us regarding the results in 2 weeks, please contact this office.

## 2016-02-07 ENCOUNTER — Encounter: Payer: Self-pay | Admitting: Allergy and Immunology

## 2016-02-07 ENCOUNTER — Ambulatory Visit (INDEPENDENT_AMBULATORY_CARE_PROVIDER_SITE_OTHER): Payer: Commercial Managed Care - PPO | Admitting: Allergy and Immunology

## 2016-02-07 VITALS — BP 138/78 | HR 64 | Temp 97.7°F | Resp 16 | Ht 63.0 in | Wt 153.0 lb

## 2016-02-07 DIAGNOSIS — L509 Urticaria, unspecified: Secondary | ICD-10-CM | POA: Diagnosis not present

## 2016-02-07 DIAGNOSIS — L308 Other specified dermatitis: Secondary | ICD-10-CM

## 2016-02-07 DIAGNOSIS — L989 Disorder of the skin and subcutaneous tissue, unspecified: Secondary | ICD-10-CM

## 2016-02-07 MED ORDER — MONTELUKAST SODIUM 10 MG PO TABS: 10.0000 mg | ORAL_TABLET | Freq: Every day | ORAL | 5 refills | Status: DC

## 2016-02-07 NOTE — Patient Instructions (Addendum)
  1. Allergen avoidance measures?  2. Blood - CBC w/diff, CMP, SED, fish panel, shellfish panel, alpha gal panel, TSH, T4, TP, ANA w/reflex, UA  3. Every day utilize the following medications:   A. OTC cetirizine 10 mg one tablet twice a day  B. OTC ranitidine 150 mg one tablet twice a day  C. montelukast 10 mg one tablet one time per day  4. Can add the following if needed:   A. OTC Benadryl 25-50 mg every 6 hours  B. continue topical triamcinolone cream 1-2 times a day  5. Return to clinic in 3 weeks or earlier if problem

## 2016-02-07 NOTE — Progress Notes (Signed)
Dear Dr. Neva Seat,  Thank you for referring Nathan Bruce to the [facility] [place] on 02/07/2016.   Below is a summation of this patient's evaluation and recommendations.  Thank you for your referral. I will keep you informed about this patient's response to treatment.   If you have any questions please to do hesitate to contact me.   Sincerely,  Jessica Priest, MD [facility]   ______________________________________________________________________    NEW PATIENT NOTE  Referring Provider: Shade Flood, MD Primary Provider: Shade Flood, MD Date of office visit: 02/07/2016    Subjective:   Chief Complaint:  Nathan Bruce (DOB: [birth year]) is a 70 y.o. male who presents to the clinic on 02/07/2016 with a chief complaint of Rash and Urticaria .     HPI: Nathan Bruce Presents to this clinic in evaluation of a skin problem that's been in existence for approximately 4 months requiring him to get two physician evaluations during this timeframe. He arrives today with his daughter who translates for him as he does not speak any [place].  He states that he has developed itchiness across his entire body over the course of the past 4 months but especially involving his lower extremities and back and on abdomen. He is bothered by this mostly when he is not busy. If he is busy working then he does not realize that he is itchy. He has been excoriating his skin extensively especially his lower extremities. He notes red patches that come up sometimes with scratching his skin and sometimes without scratching his skin. He has no associated systemic or constitutional symptoms. However, he may have a little bit more itchiness when he eats shellfish and fish although he still continues to eat these food products. He has been evaluated and treated at the urgent care center with topical steroids and oral steroids which he does  not think has helped him very much. There is no obvious trigger. He has not had a significant environmental trigger, has started new medications, has started a new hobby, or altered his diet to any degree. He does not have a history of other atopic disease.  Past Medical History:  Diagnosis Date  . Hypertension     Past Surgical History:  Procedure Laterality Date  . APPENDECTOMY    . I&D EXTREMITY Left 08/22/2013   Procedure: IRRIGATION AND DEBRIDEMENT EXTREMITY;  Surgeon: Tami Ribas, MD;  [facility] OR;  Service: Orthopedics;  Laterality: Left;  left ring finger  . LUMBAR DISC SURGERY  2011   in [place]   . PERCUTANEOUS PINNING Left 08/22/2013   Procedure: PERCUTANEOUS PINNING EXTREMITY;  Surgeon: Tami Ribas, MD;  Location: [facility] OR;  Service: Orthopedics;  Laterality: Left;  left ring finger      Medication List      ibuprofen 200 MG tablet Commonly known as:  ADVIL,MOTRIN Take 400 mg by mouth every 6 (six) hours as needed for moderate pain. Reported on 09/20/2015   triamcinolone cream 0.1 % Commonly known as:  KENALOG Apply 1 application topically 2 (two) times daily.       No Known Allergies  Review of systems negative except as noted in HPI / PMHx or noted below:  Review of Systems  Constitutional: Negative.   HENT: Negative.   Eyes: Negative.   Respiratory: Negative.   Cardiovascular: Negative.   Gastrointestinal: Negative.   Genitourinary: Negative.   Musculoskeletal:  Negative.   Skin: Negative.   Neurological: Negative.   Endo/Heme/Allergies: Negative.   Psychiatric/Behavioral: Negative.     No family history on file.  Social History   Social History  . Marital status: Married    Spouse name: N/A  . Number of children: N/A  . Years of education: N/A   Occupational History  . Not on file.   Social History Main Topics  . Smoking status: Former Smoker    Types: Cigarettes    Quit date: 04/23/2005  . Smokeless tobacco: Never Used  . Alcohol use  No  . Drug use: No  . Sexual activity: Not on file   Other Topics Concern  . Not on file   Social History Narrative  . No narrative on file    Environmental and Social history  Lives in a house with a dry environment, no animals located inside the household, no carpeting in the bedroom, plastic on the bed and pillow, and no smoking ongoing with inside the household. He works as a Engineer, materials.  Objective:   Vitals:   02/07/16 0941  BP: 138/78  Pulse: 64  Resp: 16  Temp: 97.7 F (36.5 C)   Height: 5\' 3"  (160 cm) Weight: 153 lb (69.4 kg)  Physical Exam  Constitutional: He is well-developed, well-nourished, and in no distress.  HENT:  Head: Normocephalic. Head is without right periorbital erythema and without left periorbital erythema.  Right Ear: Tympanic membrane, external ear and ear canal normal.  Left Ear: Tympanic membrane, external ear and ear canal normal.  Nose: Nose normal. No mucosal edema or rhinorrhea.  Mouth/Throat: Oropharynx is clear and moist and mucous membranes are normal. No oropharyngeal exudate.  Eyes: Conjunctivae and lids are normal. Pupils are equal, round, and reactive to light.  Neck: Trachea normal. No tracheal deviation present. No thyromegaly present.  Cardiovascular: Normal rate, regular rhythm, S1 normal, S2 normal and normal heart sounds.   No murmur heard. Pulmonary/Chest: Effort normal. No stridor. No tachypnea. No respiratory distress. He has no wheezes. He has no rales. He exhibits no tenderness.  Abdominal: Soft. He exhibits no distension and no mass. There is no hepatosplenomegaly. There is no tenderness. There is no rebound and no guarding.  Musculoskeletal: He exhibits no edema or tenderness.  Lymphadenopathy:       Head (right side): No tonsillar adenopathy present.       Head (left side): No tonsillar adenopathy present.    He has no cervical adenopathy.    He has no axillary adenopathy.  Neurological: He is alert. Gait  normal.  Skin: Rash (Multiple hyperpigmented lichenified plaques on lower extremities including shins and dorsal surface of foot. Multiple areas of excoriation involving abdomen and upper back. Obvious urticarial lesion right shoulder. Nail fungus bilateral toes) noted. He is not diaphoretic. No erythema. No pallor. Nails show no clubbing.  Psychiatric: Mood and affect normal.    Diagnostics: Allergy skin tests were performed. He did not demonstrate any hypersensitivity against a screening panel of aeroallergens or foods. He had slight dermatographia.  Assessment and Plan:    1. Urticaria   2. Inflammatory dermatosis     1. Allergen avoidance measures?  2. Blood - CBC w/diff, CMP, SED, fish panel, shellfish panel, alpha gal panel, TSH, T4, TP, ANA w/reflex, UA  3. Every day utilize the following medications:   A. OTC cetirizine 10 mg one tablet twice a day  B. OTC ranitidine 150 mg one tablet twice a day  C.  montelukast 10 mg one tablet one time per day  4. Can add the following if needed:   A. OTC Benadryl 25-50 mg every 6 hours  B. continue topical triamcinolone cream 1-2 times a day  5. 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 There may be an inflammatory component to some of his skin lesions although certainly there is evidence of her urticaria and he may just be having secondary forms of inflammation secondary to his excoriation which appears to be quite extensive. I will be contacting his daughter with the results of his blood tests once they are available for review and I will see he and his daughter back in this clinic in 3 weeks or earlier if there is a problem.  Jessica PriestEric J. Keiko Myricks, MD [place] [facility]  [place]

## 2016-02-11 LAB — CBC WITH DIFFERENTIAL/PLATELET
BASOS ABS: 0 10*3/uL (ref 0.0–0.2)
Basos: 0 %
EOS (ABSOLUTE): 0.2 10*3/uL (ref 0.0–0.4)
EOS: 3 %
HEMOGLOBIN: 14.7 g/dL (ref 12.6–17.7)
Hematocrit: 44.2 % (ref 37.5–51.0)
IMMATURE GRANS (ABS): 0 10*3/uL (ref 0.0–0.1)
Immature Granulocytes: 0 %
LYMPHS: 30 %
Lymphocytes Absolute: 1.6 10*3/uL (ref 0.7–3.1)
MCH: 26.5 pg — AB (ref 26.6–33.0)
MCHC: 33.3 g/dL (ref 31.5–35.7)
MCV: 80 fL (ref 79–97)
MONOCYTES: 7 %
Monocytes Absolute: 0.4 10*3/uL (ref 0.1–0.9)
NEUTROS ABS: 3.1 10*3/uL (ref 1.4–7.0)
Neutrophils: 60 %
Platelets: 207 10*3/uL (ref 150–379)
RBC: 5.54 x10E6/uL (ref 4.14–5.80)
RDW: 14.4 % (ref 12.3–15.4)
WBC: 5.3 10*3/uL (ref 3.4–10.8)

## 2016-02-11 LAB — ALPHA-GAL PANEL
Alpha Gal IgE*: <0.1 kU/L (ref <0.35)
Class Interpretation: 0
Class Interpretation: 0
LAMB CLASS INTERPRETATION: 0
Pork (Sus spp) IgE: <0.1 kU/L (ref <0.35)

## 2016-02-11 LAB — URINALYSIS
Bilirubin, UA: NEGATIVE
GLUCOSE, UA: NEGATIVE
KETONES UA: NEGATIVE
LEUKOCYTES UA: NEGATIVE
NITRITE UA: NEGATIVE
Protein, UA: NEGATIVE
RBC UA: NEGATIVE
SPEC GRAV UA: 1.017 (ref 1.005–1.030)
UUROB: 0.2 mg/dL (ref 0.2–1.0)
pH, UA: 6 (ref 5.0–7.5)

## 2016-02-11 LAB — ALLERGEN PROFILE, FOOD-FISH
Allergen Trout IgE: <0.1 kU/L
Codfish IgE: <0.1 kU/L

## 2016-02-11 LAB — COMPREHENSIVE METABOLIC PANEL
ALBUMIN: 5.2 g/dL — AB (ref 3.5–4.8)
ALT: 23 IU/L (ref 0–44)
AST: 28 IU/L (ref 0–40)
Albumin/Globulin Ratio: 1.6 (ref 1.2–2.2)
Alkaline Phosphatase: 73 IU/L (ref 39–117)
BILIRUBIN TOTAL: 0.8 mg/dL (ref 0.0–1.2)
BUN / CREAT RATIO: 13 (ref 10–24)
BUN: 15 mg/dL (ref 8–27)
CO2: 25 mmol/L (ref 18–29)
CREATININE: 1.13 mg/dL (ref 0.76–1.27)
Calcium: 9.6 mg/dL (ref 8.6–10.2)
Chloride: 100 mmol/L (ref 96–106)
GFR calc non Af Amer: 65 mL/min/{1.73_m2} (ref >59)
GFR, EST AFRICAN AMERICAN: 75 mL/min/{1.73_m2} (ref >59)
GLUCOSE: 107 mg/dL — AB (ref 65–99)
Globulin, Total: 3.2 g/dL (ref 1.5–4.5)
Potassium: 4 mmol/L (ref 3.5–5.2)
Sodium: 141 mmol/L (ref 134–144)
TOTAL PROTEIN: 8.4 g/dL (ref 6.0–8.5)

## 2016-02-11 LAB — ALLERGEN PROFILE, SHELLFISH
Clam IgE: <0.1 kU/L
F023-IgE Crab: 0.28 kU/L — AB
F080-IGE LOBSTER: 0.17 kU/L — AB
F290-IgE Oyster: <0.1 kU/L
Scallop IgE: <0.1 kU/L
Shrimp IgE: 0.51 kU/L — AB

## 2016-02-11 LAB — SEDIMENTATION RATE: Sed Rate: 2 mm/hr (ref 0–30)

## 2016-02-11 LAB — THYROID PEROXIDASE ANTIBODY: Thyroperoxidase Ab SerPl-aCnc: 15 IU/mL (ref 0–34)

## 2016-02-11 LAB — ANA W/REFLEX IF POSITIVE: ANA: NEGATIVE

## 2016-02-11 LAB — T4 AND TSH
T4, Total: 7.5 ug/dL (ref 4.5–12.0)
TSH: 1.13 u[IU]/mL (ref 0.450–4.500)

## 2016-02-28 ENCOUNTER — Encounter: Payer: Self-pay | Admitting: *Deleted

## 2016-02-29 ENCOUNTER — Encounter: Payer: Self-pay | Admitting: Allergy and Immunology

## 2016-02-29 ENCOUNTER — Ambulatory Visit (INDEPENDENT_AMBULATORY_CARE_PROVIDER_SITE_OTHER): Payer: Commercial Managed Care - PPO | Admitting: Allergy and Immunology

## 2016-02-29 VITALS — BP 144/70 | HR 80 | Resp 20

## 2016-02-29 DIAGNOSIS — L853 Xerosis cutis: Secondary | ICD-10-CM | POA: Diagnosis not present

## 2016-02-29 DIAGNOSIS — L989 Disorder of the skin and subcutaneous tissue, unspecified: Secondary | ICD-10-CM

## 2016-02-29 DIAGNOSIS — L308 Other specified dermatitis: Secondary | ICD-10-CM

## 2016-02-29 DIAGNOSIS — L509 Urticaria, unspecified: Secondary | ICD-10-CM | POA: Diagnosis not present

## 2016-02-29 MED ORDER — TRIAMCINOLONE ACETONIDE 0.1 % EX CREA: TOPICAL_CREAM | CUTANEOUS | 5 refills | Status: DC

## 2016-02-29 NOTE — Patient Instructions (Addendum)
  1. Every day utilize the following medications:   A. OTC cetirizine 10 mg one tablet twice a day  B. OTC ranitidine 150 mg one tablet twice a day  C. montelukast 10 mg one tablet one time per day  2. Can add the following if needed:   A. OTC Benadryl 25-50 mg every 6 hours  B. continue topical triamcinolone cream 1-2 times a day  3. Discontinue eating all shellfish at this point in time  4. Return to clinic in 12 weeks or earlier if problem

## 2016-02-29 NOTE — Progress Notes (Signed)
Follow-up Note  Referring Provider: Shade Bruce, Nathan R, MD Primary Provider: Shade Bruce,Nathan R, MD Date of Office Visit: 02/29/2016  Subjective:   Nathan Bruce (DOB: [birth year]) is a 70 y.o. male who [facility] the [facility] on 02/29/2016 in re-evaluation of the following:  HPI: [name] returns to this clinic in evaluation of his pruritic disorder with inflammatory dermatosis and urticaria. When I saw him in this clinic during his initial evaluation 02/07/2016 we had a plan of action to utilize including an H1 and H2 receptor blocker and a leukotriene modifier and consistent use of his triamcinolone. He is better. He has much less itchiness. He has had some difficulties utilizing all the medications as there has been a communication issue as he speaks no [place]. His daughter is here today to help translate.    Medication List      BENADRYL PO Take by mouth.   montelukast 10 MG tablet Commonly known as:  SINGULAIR Take 1 tablet (10 mg total) by mouth at bedtime.   triamcinolone cream 0.1 % Commonly known as:  KENALOG Apply 1 application topically 2 (two) times daily.       Past Medical History:  Diagnosis Date  . Food allergy    Shellfish allergy  . Hypertension     Past Surgical History:  Procedure Laterality Date  . APPENDECTOMY    . I&D EXTREMITY Left 08/22/2013   Procedure: IRRIGATION AND DEBRIDEMENT EXTREMITY;  Surgeon: Tami RibasKevin Bruce Kuzma, MD;  [facility];  Service: Orthopedics;  Laterality: Left;  left ring finger  . LUMBAR DISC SURGERY  2011   in [place]   . PERCUTANEOUS PINNING Left 08/22/2013   Procedure: PERCUTANEOUS PINNING EXTREMITY;  Surgeon: Tami RibasKevin Bruce Kuzma, MD;  [facility];  Service: Orthopedics;  Laterality: Left;  left ring finger    No Known Allergies  Review of systems negative except as noted in HPI / PMHx or noted below:  Review of Systems  Constitutional: Negative.   HENT: Negative.   Eyes: Negative.   Respiratory: Negative.    Cardiovascular: Negative.   Gastrointestinal: Negative.   Genitourinary: Negative.   Musculoskeletal: Negative.   Skin: Negative.   Neurological: Negative.   Endo/Heme/Allergies: Negative.   Psychiatric/Behavioral: Negative.      Objective:   Vitals:   02/29/16 1034  BP: (!) 144/70  Pulse: 80  Resp: 20          Physical Exam  Skin: Rash (No urticaria, hyperpigmented areas affecting dorsal surface of foot and shin but no erythema and no induration.) noted.    Diagnostics: Results of blood tests obtained on 02/07/2016 identified normal hepatic and renal function, white blood cell count of 5.3 with a normal differential, hemoglobin 14.7 with an MCV of 80 and a platelet count of 207, normal TSH and T4 and no elevated thyroperoxidase antibodies. He did have elevated IgE antibodies directed against various shellfish members but not directed against any fish. He had a negative alpha gal panel.   Assessment and Plan:   1. Urticaria   2. Inflammatory dermatosis   3. Dry skin dermatitis     1. Every day utilize the following medications:   A. OTC cetirizine 10 mg one tablet twice a day  B. OTC ranitidine 150 mg one tablet twice a day  C. montelukast 10 mg one tablet one time per day  2. Can add the following if needed:   A. OTC Benadryl 25-50 mg every 6 hours  B. continue topical triamcinolone cream 1-2 times a day  3. Discontinue eating all shellfish at this point in time  4. Return to clinic in 12 weeks or earlier if problem  Cesare has definitely improved and I'm going to continue to have him use the combination of therapy noted above for the next 12 weeks and I've encouraged him to stop eating all shellfish at this point in time as this may be contributing to some of his immunological hyperreactivity. He will contact me, via his daughter who acts as a Nurse, learning disabilitytranslator, if he has any difficulty during the interval.  Laurette SchimkeEric Kozlow, MD [facility]

## 2016-04-10 ENCOUNTER — Telehealth: Payer: Self-pay | Admitting: Allergy and Immunology

## 2016-05-02 ENCOUNTER — Ambulatory Visit: Payer: Commercial Managed Care - PPO | Admitting: Allergy and Immunology

## 2016-05-18 NOTE — Telephone Encounter (Signed)
Made in error

## 2016-06-07 ENCOUNTER — Ambulatory Visit (INDEPENDENT_AMBULATORY_CARE_PROVIDER_SITE_OTHER): Payer: Self-pay

## 2016-06-07 ENCOUNTER — Ambulatory Visit (INDEPENDENT_AMBULATORY_CARE_PROVIDER_SITE_OTHER): Payer: Self-pay | Admitting: Physician Assistant

## 2016-06-07 ENCOUNTER — Emergency Department (HOSPITAL_COMMUNITY)
Admission: EM | Admit: 2016-06-07 | Discharge: 2016-06-07 | Discharge status: Absent without leave | Payer: Commercial Managed Care - PPO

## 2016-06-07 VITALS — BP 154/62 | HR 71 | Temp 98.7°F | Resp 18 | Ht 63.0 in | Wt 152.6 lb

## 2016-06-07 DIAGNOSIS — M25511 Pain in right shoulder: Secondary | ICD-10-CM

## 2016-06-07 DIAGNOSIS — M19019 Primary osteoarthritis, unspecified shoulder: Secondary | ICD-10-CM

## 2016-06-07 DIAGNOSIS — M19011 Primary osteoarthritis, right shoulder: Secondary | ICD-10-CM

## 2016-06-07 DIAGNOSIS — Z23 Encounter for immunization: Secondary | ICD-10-CM

## 2016-06-07 DIAGNOSIS — R0789 Other chest pain: Secondary | ICD-10-CM

## 2016-06-07 MED ORDER — CYCLOBENZAPRINE HCL 5 MG PO TABS: 5.0000 mg | ORAL_TABLET | Freq: Three times a day (TID) | ORAL | 1 refills | Status: DC | PRN

## 2016-06-07 NOTE — Progress Notes (Signed)
[facility] [address], [place] [id number] [phone]  Date:  06/07/2016   Name:  Nathan Bruce   DOB:  July 04, 1945   MRN:  [id number]  PCP:  Shade FloodGREENE,[name], MD    History of Present Illness:  Nathan Bruce is a 71 y.o. male patient who [facility].    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  There is some numbing across the left upper arm.  There is some upper back pain.  There is pain when fhe takes a deep inhalation as well.  He would like an xray and under the armpit.  Pulling large laundry baskets.    --left upper back pain that comes around the chest.   There are no active problems to display for this patient.   Past Medical History:  Diagnosis Date  . Food allergy    Shellfish allergy  . Hypertension     Past Surgical History:  Procedure Laterality Date  . APPENDECTOMY    . I&D EXTREMITY Left 08/22/2013   Procedure: IRRIGATION AND DEBRIDEMENT EXTREMITY;  Surgeon: Tami RibasKevin R Kuzma, MD;  [facility];  Service: Orthopedics;  Laterality: Left;  left ring finger  . LUMBAR DISC SURGERY  2011   [place]   . PERCUTANEOUS PINNING Left 08/22/2013   Procedure: PERCUTANEOUS PINNING EXTREMITY;  Surgeon: Tami RibasKevin R Kuzma, MD;  [facility];  Service: Orthopedics;  Laterality: Left;  left ring finger  . TOOTH EXTRACTION      Social History  Substance Use Topics  . Smoking status: Former Smoker    Types: Cigarettes    Quit date: 04/23/2005  . Smokeless tobacco: Never Used  . Alcohol use No    History reviewed. No pertinent family history.  No Known Allergies  Medication list has been reviewed and updated.  No current outpatient prescriptions on file prior to visit.   No current facility-administered medications on file prior to visit.     ROS ROS otherwise unremarkable unless listed above  Physical Examination: BP (!) 154/62   Pulse 71   Temp 98.7 F (37.1 C)  (Oral)   Resp 18   Ht 5\' 3"  (1.6 m)   Wt 152 lb 9.6 oz (69.2 kg)   SpO2 97%   BMI 27.03 kg/m  Ideal Body Weight: Weight in (lb) to have BMI = 25: 140.8  Physical Exam  Constitutional: He is oriented to person, place, and time. He appears well-developed and well-nourished. No distress.  HENT:  Head: Normocephalic and atraumatic.  Eyes: Conjunctivae and EOM are normal. Pupils are equal, round, and reactive to light.  Cardiovascular: Normal rate.   Pulmonary/Chest: Effort normal. No apnea. No respiratory distress. He has no decreased breath sounds. He has no wheezes. He has no rhonchi. He exhibits tenderness (right sided chest wall tenderness and around to the axilla.  no crepitus or swelling.). He exhibits no mass.  Musculoskeletal:       Right shoulder: He exhibits tenderness and spasm (small spasm appreciated just inferiorly adjacent to the scapula. ). He exhibits normal range of motion and normal strength.       Cervical back: He exhibits normal range of motion and no tenderness.  Positive neer's and hawkins.  Pain incited with external shoulder rotation of the right side.  Tender along the shoulder generalized as well as the bicipital groove.  Neurological: He is alert and oriented to person, place, and time.  Skin: Skin is warm and dry. He is not diaphoretic.  Psychiatric: He has a normal mood and affect. His behavior is normal.   Dg Ribs Unilateral W/chest Right  Result Date: 06/07/2016 CLINICAL DATA:  Anterior right chest wall pain with deep inspiration. No reported injury. EXAM: RIGHT RIBS AND CHEST - 3+ VIEW COMPARISON:  None. FINDINGS: Normal heart size. Aortic atherosclerosis. Otherwise normal mediastinal contour. No pneumothorax. No pleural effusion. Lungs appear clear, with no acute consolidative airspace disease and no pulmonary edema. No fracture or suspicious focal osseous lesion detected in the right ribs. Mild thoracic spondylosis. IMPRESSION: 1. No active cardiopulmonary  disease. 2. No fracture or suspicious focal osseous lesion detected in the right ribs. 3. Aortic atherosclerosis. Electronically Signed   By: Delbert Phenix M.D.   On: 06/07/2016 11:08   Dg Shoulder Right  Result Date: 06/07/2016 CLINICAL DATA:  Acute right shoulder pain and tenderness with external rotation. No reported injury. EXAM: RIGHT SHOULDER - 2+ VIEW COMPARISON:  None. FINDINGS: No fracture. No right glenohumeral joint dislocation. 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 Mild osteoarthritis at the inferior right glenohumeral joint. No suspicious focal osseous lesions. No pathologic soft tissue calcifications. IMPRESSION: No fracture or malalignment. 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 Electronically Signed   By: Delbert Phenix M.D.   On: 06/07/2016 11:09      Assessment and Plan: Nathan Bruce is a 71 y.o. male who is here today  There is possibility of nerve impingement in combination of arthritis of the GH and AC joint.  Advised icing three times per day for 15 minutes.  Advised tylenol and muscle relaxant.  Note restriction from work given for the next 4 days.  Advised that he may need to cut hours.  6 days of full time strenuous/laborous work is difficult to continue for 71 year old male.  Patient acknowledged this with daughter. Acute pain of right shoulder - Plan: DG Shoulder Right, DG Ribs Unilateral W/Chest Right  Need for prophylactic vaccination and inoculation against influenza - Plan: Flu Vaccine QUAD 36+ mos IM  Right-sided chest wall pain - Plan: DG Ribs Unilateral W/Chest Right  Trena Platt, PA-C [facility] 2/15/20185:44 PM

## 2016-06-07 NOTE — Patient Instructions (Addendum)
I would like you to take tylenol for your pain.  This is over the counter.  Do not take more than 2000mg  in a day.  Take with food.  I am also giving you a muscle relaxant.  Be careful with walking.  You can not operate heavy machinery with this medication.    Please ice the shoulder three times per day for 15 minutes.       IF you received an x-ray today, you will receive an invoice from [facility]. Please contact [facility] at [phone] with questions or concerns regarding your invoice.   IF you received labwork today, you will receive an invoice from [facility]. Please contact [facility] at [phone] with questions or concerns regarding your invoice.   Our billing staff will not be able to assist you with questions regarding bills from these companies.  You will be contacted with the lab results as soon as they are available. The fastest way to get your results is to activate your My Chart account. Instructions are located on the last page of this paperwork. If you have not heard from us regarding the results in 2 weeks, please contact this office.

## 2017-06-03 ENCOUNTER — Ambulatory Visit (INDEPENDENT_AMBULATORY_CARE_PROVIDER_SITE_OTHER): Payer: Medicare Other | Admitting: Family Medicine

## 2017-06-03 ENCOUNTER — Other Ambulatory Visit: Payer: Self-pay

## 2017-06-03 ENCOUNTER — Encounter: Payer: Self-pay | Admitting: Family Medicine

## 2017-06-03 VITALS — BP 140/72 | HR 66 | Temp 98.2°F | Resp 18 | Ht 63.0 in | Wt 159.0 lb

## 2017-06-03 DIAGNOSIS — R1031 Right lower quadrant pain: Secondary | ICD-10-CM | POA: Diagnosis not present

## 2017-06-03 DIAGNOSIS — R300 Dysuria: Secondary | ICD-10-CM | POA: Diagnosis not present

## 2017-06-03 DIAGNOSIS — Z789 Other specified health status: Secondary | ICD-10-CM

## 2017-06-03 LAB — POCT CBC
Granulocyte percent: 59.7 %G (ref 37–80)
HCT, POC: 47 % (ref 43.5–53.7)
Hemoglobin: 15.2 g/dL (ref 14.1–18.1)
Lymph, poc: 2 (ref 0.6–3.4)
MCH, POC: 26.9 pg — AB (ref 27–31.2)
MCHC: 32.4 g/dL (ref 31.8–35.4)
MCV: 83.2 fL (ref 80–97)
MID (CBC): 0.5 (ref 0–0.9)
MPV: 6.9 fL (ref 0–99.8)
PLATELET COUNT, POC: 220 10*3/uL (ref 142–424)
POC Granulocyte: 3.7 (ref 2–6.9)
POC LYMPH %: 32.4 % (ref 10–50)
POC MID %: 7.9 %M (ref 0–12)
RBC: 5.65 M/uL (ref 4.69–6.13)
RDW, POC: 14.4 %
WBC: 6.2 10*3/uL (ref 4.6–10.2)

## 2017-06-03 LAB — POCT URINALYSIS DIP (MANUAL ENTRY)
Bilirubin, UA: NEGATIVE
Blood, UA: NEGATIVE
GLUCOSE UA: NEGATIVE mg/dL
Ketones, POC UA: NEGATIVE mg/dL
Leukocytes, UA: NEGATIVE
NITRITE UA: NEGATIVE
Protein Ur, POC: NEGATIVE mg/dL
Spec Grav, UA: 1.015 (ref 1.010–1.025)
UROBILINOGEN UA: 0.2 U/dL
pH, UA: 5.5 (ref 5.0–8.0)

## 2017-06-03 LAB — POC MICROSCOPIC URINALYSIS (UMFC): MUCUS RE: ABSENT

## 2017-06-03 NOTE — Patient Instructions (Addendum)
Urine test and blood counts looked normal. If pain is not improving in next 2 days, return for recheck and to decide if CT scan of abdomen needed. Return to the clinic or go to the nearest emergency room if any of your symptoms worsen or new symptoms occur.   ?au b?ng, Ng??i l?n Abdominal Pain, Adult Nhi?u nguyn nhn c th? gy ra ?au b?ng. ?au b?ng th??ng khng nghim tr?ng v ?? m khng c?n ph?i ?i?u tr? ho?c khi ???c ?i?u tr? t?i nh. Tuy nhin, ?i khi ?au b?ng l nghim tr?ng. Chuyn gia ch?m [place] s?c kh?e c?a qu v? s? khai thc b?nh s? v khm th?c th? ?? xc ??nh nguyn nhn gy ra ?au b?ng. Tun th? nh?ng h??ng d?n ny ? nh:  Ch? s? d?ng thu?c khng k ??n v thu?c k ??n theo ch? d?n c?a chuyn gia ch?m [place] s?c kh?e. Khng dng thu?c nhu?n trng tr? khi ???c chuyn gia ch?m [place] s?c kh?e c?a qu v? ch? d?n.  U?ng ?? n??c ?? gi? cho n??c ti?u trong ho?c c mu vng nh?t.  Theo di tnh tr?ng c?a qu v? ?? pht hi?n b?t k? thay ??i no.  Tun th? t?t c? cc cu?c h?n khm l?i theo ch? d?n c?a chuyn gia ch?m [place] s?c kh?e. ?i?u ny c vai tr quan tr?ng. Hy lin l?c v?i chuyn gia ch?m [place] s?c kh?e n?u:  Tnh tr?ng ?au b?ng c?a qu v? thay ??i ho?c tr?m tr?ng h?n.  Qu v? khng ?i ho?c qu v? b? s?t cn m khng c? g?ng gi?m cn.  Qu v? b? to bn ho?c b? tiu ch?y qu 2-3 ngy.  Qu v? b? ?au khi ?i ti?u ho?c ?i ngoi.  ?au b?ng khi?n qu v? t?nh gi?c vo ban ?m.  ?au tr? nn tr?m tr?ng h?n khi ?n, sau khi ?n, ho?c khi ?n m?t s? th?c ph?m nh?t ??nh.  Qu v? nn v khng th? km l?i ???c.  Qu v? b? s?t. Yu c?u tr? gip ngay l?p t?c n?u:  C?n ?au c?a qu v? khng h?t s?m theo nh? d? ki?n c?a chuyn gia ch?m [place] s?c kh?e c?a qu v?.  Qu v? v?n ti?p t?c b? nn.  Ch? c?m th?y ?au ? cc vng c?a b?ng, ch?ng h?n nh? ? ph?n b?ng d??i bn ph?i ho?c bn tri.  Quy? vi? ?i ngoi phn c ma?u ho??c phn ma?u ?en, ho??c phn trng nh? h??c i?n.  Qu v? b? ?au r?t nhi?u, co  th?t ho??c ch???ng b?ng.  Qu v? c cc d?u hi?u m?t n??c, ch?ng h?n nh?: ? N??c ti?u s?m mu, r?t t n??c ti?u, ho?c khng c n??c ti?u. ? Mi n?t n?. ? Mi?ng kh. ? M?t tr?ng. ? Bu?n ng?. ? Y?u. Thng tin ny khng nh?m m?c ?ch thay th? cho l?i khuyn m chuyn gia ch?m [place] s?c kh?e ni v?i qu v?. Hy b?o ??m qu v? ph?i th?o lu?n b?t k? v?n ?? g m qu v? c v?i chuyn gia ch?m [place] s?c kh?e c?a qu v?. Document Released: 04/09/2005 Document Revised: 03/29/2016 Document Reviewed: 09/21/2015 Elsevier Interactive Patient Education  2018 [employer]   IF you received an x-ray today, you will receive an invoice from [facility]. Please contact [facility] at [phone] with questions or concerns regarding your invoice.   IF you received labwork today, you will receive an invoice from [facility]. Please contact [facility] at [phone] with questions or concerns regarding your invoice.  Our billing staff will not be able to assist you with questions regarding bills from these companies.  You will be contacted with the lab results as soon as they are available. The fastest way to get your results is to activate your My Chart account. Instructions are located on the last page of this paperwork. If you have not heard from us regarding the results in 2 weeks, please contact this office.     

## 2017-06-03 NOTE — Progress Notes (Signed)
Subjective:  By signing my name below, I, Essence Howell, attest that this documentation has been prepared under the direction and in the presence of Shade Flood, MD Electronically Signed: Charline Bills, ED Scribe 06/03/2017 at 2:46 PM.   Patient ID: Nathan Bruce, male    DOB: 09-05-1945, 72 y.o.   MRN: [id number]  Chief Complaint  Patient presents with  . Abdominal Pain    right lower pain at scar where he had surgery 25 years ago appendicitis    HPI Nathan Bruce is a 72 y.o. male who presents to Primary Care at [facility] complaining of RLQ abdominal pain first noticed 2-3 days ago. Pt also presents dysuria onset 2-3 days ago. Denies difficulty urinating, vomiting, diarrhea, blood in stools, melena. Pt has never had a colonoscopy.  Pt's daughter is present to translate. Pt's native language is [place] but he also speaks [place]  There are no active problems to display for this patient.  Past Medical History:  Diagnosis Date  . Food allergy    Shellfish allergy  . Hypertension    Past Surgical History:  Procedure Laterality Date  . APPENDECTOMY    . I&D EXTREMITY Left 08/22/2013   Procedure: IRRIGATION AND DEBRIDEMENT EXTREMITY;  Surgeon: Tami Ribas, MD;  Location: [facility] OR;  Service: Orthopedics;  Laterality: Left;  left ring finger  . LUMBAR DISC SURGERY  2011   in [place]   . PERCUTANEOUS PINNING Left 08/22/2013   Procedure: PERCUTANEOUS PINNING EXTREMITY;  Surgeon: Tami Ribas, MD;  Location: [facility] OR;  Service: Orthopedics;  Laterality: Left;  left ring finger  . TOOTH EXTRACTION     No Known Allergies Prior to Admission medications   Medication Sig Start Date End Date Taking? Authorizing Provider  cyclobenzaprine (FLEXERIL) 5 MG tablet Take 1 tablet (5 mg total) by mouth 3 (three) times daily as needed for muscle spasms. Patient not taking: Reported on 06/03/2017 06/07/16   Garnetta Buddy, PA   Social History   Socioeconomic History  . Marital status:  Married    Spouse name: Not on file  . Number of children: Not on file  . Years of education: Not on file  . Highest education level: Not on file  Social Needs  . Financial resource strain: Not on file  . Food insecurity - worry: Not on file  . Food insecurity - inability: Not on file  . Transportation needs - medical: Not on file  . Transportation needs - non-medical: Not on file  Occupational History  . Not on file  Tobacco Use  . Smoking status: Former Smoker    Types: Cigarettes    Last attempt to quit: 04/23/2005    Years since quitting: 12.1  . Smokeless tobacco: Never Used  Substance and Sexual Activity  . Alcohol use: No    Alcohol/week: 0.0 oz  . Drug use: No  . Sexual activity: Not on file  Other Topics Concern  . Not on file  Social History Narrative  . Not on file   Review of Systems  Gastrointestinal: Positive for abdominal pain. Negative for blood in stool, diarrhea and vomiting.  Genitourinary: Positive for dysuria. Negative for difficulty urinating.      Objective:   Physical Exam  Constitutional: He is oriented to person, place, and time. He appears well-developed and well-nourished. No distress.  HENT:  Head: Normocephalic and atraumatic.  Eyes: Conjunctivae and EOM are normal.  Neck: Neck supple. No tracheal deviation present.  Cardiovascular: Normal rate and regular rhythm.  Pulmonary/Chest: Effort normal and breath sounds normal. No respiratory distress.  Abdominal: There is tenderness in the right lower quadrant. There is no CVA tenderness. Hernia confirmed negative in the right inguinal area and confirmed negative in the left inguinal area.  Not having any skin sensitivity. No rash. Tender over RLQ, directly over area of well-healed scar, including with sitting up. I do not see any obvious hernia.  Musculoskeletal: Normal range of motion.  Neurological: He is alert and oriented to person, place, and time.  Skin: Skin is warm and dry. No rash noted.    Psychiatric: He has a normal mood and affect. His behavior is normal.  Nursing note and vitals reviewed.  Vitals:   06/03/17 1432  BP: 140/72  Pulse: 66  Resp: 18  Temp: 98.2 F (36.8 C)  TempSrc: Oral  SpO2: 98%  Weight: 159 lb (72.1 kg)  Height: 5\' 3"  (1.6 m)   Results for orders placed or performed in visit on 06/03/17  POCT urinalysis dipstick  Result Value Ref Range   Color, UA yellow yellow   Clarity, UA clear clear   Glucose, UA negative negative mg/dL   Bilirubin, UA negative negative   Ketones, POC UA negative negative mg/dL   Spec Grav, UA 2.1301.015 [id number] - 1.025   Blood, UA negative negative   pH, UA 5.5 5.0 - 8.0   Protein Ur, POC negative negative mg/dL   Urobilinogen, UA 0.2 0.2 or 1.0 E.U./dL   Nitrite, UA Negative Negative   Leukocytes, UA Negative Negative  POCT Microscopic Urinalysis ([facility])  Result Value Ref Range   WBC,UR,HPF,POC None None WBC/hpf   RBC,UR,HPF,POC None None RBC/hpf   Bacteria None None, Too numerous to count   Mucus Absent Absent   Epithelial Cells, UR Per Microscopy None None, Too numerous to count cells/hpf  POCT CBC  Result Value Ref Range   WBC 6.2 4.6 - 10.2 K/uL   Lymph, poc 2.0 0.6 - 3.4   POC LYMPH PERCENT 32.4 10 - 50 %L   MID (cbc) 0.5 0 - 0.9   POC MID % 7.9 0 - 12 %M   POC Granulocyte 3.7 2 - 6.9   Granulocyte percent 59.7 37 - 80 %G   RBC 5.65 4.69 - 6.13 M/uL   Hemoglobin 15.2 14.1 - 18.1 g/dL   HCT, POC 84.647.0 96.243.5 - 53.7 %   MCV 83.2 80 - 97 fL   MCH, POC 26.9 (A) 27 - 31.2 pg   MCHC 32.4 31.8 - 35.4 g/dL   RDW, POC 95.214.4 %   Platelet Count, POC 220 142 - 424 K/uL   MPV 6.9 0 - 99.8 fL      Assessment & Plan:   Nathan Bruce is a 72 y.o. male RLQ abdominal pain - Plan: POCT CBC  Dysuria - Plan: POCT urinalysis dipstick, POCT Microscopic Urinalysis ([facility])  Language barrier  Complaining of pain in the area of prior incision from reported appendectomy. Reassuring urinalysis and CBC. Abdominal wall pain  possible, but advised if not improving in the next 48 hours, return for recheck, possible repeat check CBC, and possible CT imaging if needed at that time. Follow-up sooner or ER if worse. Understanding expressed   No orders of the defined types were placed in this encounter.  Patient Instructions   Urine test and blood counts looked normal. If pain is not improving in next 2 days, return for recheck and to decide if  CT scan of abdomen needed. Return to the clinic or go to the nearest emergency room if any of your symptoms worsen or new symptoms occur.   ?au b?ng, Ng??i l?n Abdominal Pain, Adult Nhi?u nguyn nhn c th? gy ra ?au b?ng. ?au b?ng th??ng khng nghim tr?ng v ?? m khng c?n ph?i ?i?u tr? ho?c khi ???c ?i?u tr? t?i nh. Tuy nhin, ?i khi ?au b?ng l nghim tr?ng. Chuyn gia ch?m [place] s?c kh?e c?a qu v? s? khai thc b?nh s? v khm th?c th? ?? xc ??nh nguyn nhn gy ra ?au b?ng. Tun th? nh?ng h??ng d?n ny ? nh:  Ch? s? d?ng thu?c khng k ??n v thu?c k ??n theo ch? d?n c?a chuyn gia ch?m [place] s?c kh?e. Khng dng thu?c nhu?n trng tr? khi ???c chuyn gia ch?m [place] s?c kh?e c?a qu v? ch? d?n.  U?ng ?? n??c ?? gi? cho n??c ti?u trong ho?c c mu vng nh?t.  Theo di tnh tr?ng c?a qu v? ?? pht hi?n b?t k? thay ??i no.  Tun th? t?t c? cc cu?c h?n khm l?i theo ch? d?n c?a chuyn gia ch?m [place] s?c kh?e. ?i?u ny c vai tr quan tr?ng. Hy lin l?c v?i chuyn gia ch?m [place] s?c kh?e n?u:  Tnh tr?ng ?au b?ng c?a qu v? thay ??i ho?c tr?m tr?ng h?n.  Qu v? khng ?i ho?c qu v? b? s?t cn m khng c? g?ng gi?m cn.  Qu v? b? to bn ho?c b? tiu ch?y qu 2-3 ngy.  Qu v? b? ?au khi ?i ti?u ho?c ?i ngoi.  ?au b?ng khi?n qu v? t?nh gi?c vo ban ?m.  ?au tr? nn tr?m tr?ng h?n khi ?n, sau khi ?n, ho?c khi ?n m?t s? th?c ph?m nh?t ??nh.  Qu v? nn v khng th? km l?i ???c.  Qu v? b? s?t. Yu c?u tr? gip ngay l?p t?c n?u:  C?n ?au c?a qu v? khng h?t  s?m theo nh? d? ki?n c?a chuyn gia ch?m [place] s?c kh?e c?a qu v?.  Qu v? v?n ti?p t?c b? nn.  Ch? c?m th?y ?au ? cc vng c?a b?ng, ch?ng h?n nh? ? ph?n b?ng d??i bn ph?i ho?c bn tri.  Quy? vi? ?i ngoi phn c ma?u ho??c phn ma?u ?en, ho??c phn trng nh? h??c i?n.  Qu v? b? ?au r?t nhi?u, co th?t ho??c ch???ng b?ng.  Qu v? c cc d?u hi?u m?t n??c, ch?ng h?n nh?: ? N??c ti?u s?m mu, r?t t n??c ti?u, ho?c khng c n??c ti?u. ? Mi n?t n?. ? Mi?ng kh. ? M?t tr?ng. ? Bu?n ng?. ? Y?u. Thng tin ny khng nh?m m?c ?ch thay th? cho l?i khuyn m chuyn gia ch?m [place] s?c kh?e ni v?i qu v?. Hy b?o ??m qu v? ph?i th?o lu?n b?t k? v?n ?? g m qu v? c v?i chuyn gia ch?m [place] s?c kh?e c?a qu v?. Document Released: 04/09/2005 Document Revised: 03/29/2016 Document Reviewed: 09/21/2015 Elsevier Interactive Patient Education  2018 [employer].   IF you received an x-ray today, you will receive an invoice from [facility]. Please contact [facility] at [phone] with questions or concerns regarding your invoice.   IF you received labwork today, you will receive an invoice from [place]. Please contact [facility] at [phone] with questions or concerns regarding your invoice.   Our billing staff will not be able to assist you with questions regarding bills from these companies.  You will be contacted  with the lab results as soon as they are available. The fastest way to get your results is to activate your My Chart account. Instructions are located on the last page of this paperwork. If you have not heard from [place] regarding the results in 2 weeks, please contact this office.      I personally performed the services described in this documentation, which was scribed in my presence. The recorded information has been reviewed and considered for accuracy and completeness, addended by me as needed, and agree with information above.  Signed,   Meredith Staggers,  MD Primary Care at [facility].  06/05/17 10:44 PM

## 2017-10-30 ENCOUNTER — Encounter: Payer: Self-pay | Admitting: Physician Assistant

## 2017-10-30 ENCOUNTER — Ambulatory Visit (INDEPENDENT_AMBULATORY_CARE_PROVIDER_SITE_OTHER): Payer: Medicare Other | Admitting: Physician Assistant

## 2017-10-30 ENCOUNTER — Other Ambulatory Visit: Payer: Self-pay

## 2017-10-30 VITALS — BP 164/74 | HR 64 | Temp 98.2°F | Resp 16 | Ht 63.0 in | Wt 157.6 lb

## 2017-10-30 DIAGNOSIS — L309 Dermatitis, unspecified: Secondary | ICD-10-CM | POA: Diagnosis not present

## 2017-10-30 MED ORDER — HYDROXYZINE HCL 10 MG PO TABS: 10.0000 mg | ORAL_TABLET | Freq: Three times a day (TID) | ORAL | 0 refills | Status: DC | PRN

## 2017-10-30 MED ORDER — TRIAMCINOLONE ACETONIDE 0.5 % EX OINT: 1.0000 "application " | TOPICAL_OINTMENT | Freq: Two times a day (BID) | CUTANEOUS | 0 refills | Status: DC

## 2017-10-30 NOTE — Progress Notes (Signed)
10/31/2017 1:48 PM   DOB: Jan 16, 1946 / MRN: [id number]  SUBJECTIVE:  Nathan Bruce is a 72 y.o. male presenting for itching about the knuckles, elbows, knees. Symptoms present for about a week.  The problem is worsening. He has tried Benadryl which did not really help.  He has No Known Allergies.   He  has a past medical history of Food allergy and Hypertension.    He  reports that he quit smoking about 12 years ago. His smoking use included cigarettes. He has never used smokeless tobacco. He reports that he does not drink alcohol or use drugs. He  has no sexual activity history on file. The patient  has a past surgical history that includes Lumbar disc surgery (2011); I&D extremity (Left, 08/22/2013); Percutaneous pinning (Left, 08/22/2013); Appendectomy; and Tooth extraction.  His family history is not on file.  Review of Systems  Constitutional: Negative for chills, diaphoresis and fever.  Respiratory: Negative for cough, hemoptysis, sputum production, shortness of breath and wheezing.   Cardiovascular: Negative for chest pain, orthopnea and leg swelling.  Gastrointestinal: Negative for abdominal pain, blood in stool, constipation, diarrhea, heartburn, melena, nausea and vomiting.  Genitourinary: Negative for flank pain.  Skin: Negative for rash.  Neurological: Negative for dizziness.    The problem list and medications were reviewed and updated by myself where necessary and exist elsewhere in the encounter.   OBJECTIVE:  BP (!) 164/74   Pulse 64   Temp 98.2 F (36.8 C)   Resp 16   Ht 5\' 3"  (1.6 m)   Wt 157 lb 9.6 oz (71.5 kg)   SpO2 97%   BMI 27.92 kg/m   Wt Readings from Last 3 Encounters:  10/30/17 157 lb 9.6 oz (71.5 kg)  06/03/17 159 lb (72.1 kg)  06/07/16 152 lb 9.6 oz (69.2 kg)   Temp Readings from Last 3 Encounters:  10/30/17 98.2 F (36.8 C)  06/03/17 98.2 F (36.8 C) (Oral)  06/07/16 98.7 F (37.1 C) (Oral)   BP Readings from Last 3 Encounters:  10/30/17  (!) 164/74  06/03/17 140/72  06/07/16 (!) 154/62   Pulse Readings from Last 3 Encounters:  10/30/17 64  06/03/17 66  06/07/16 71    Physical Exam  Constitutional: He is oriented to person, place, and time. He appears well-developed. He does not appear ill.  Eyes: Pupils are equal, round, and reactive to light. Conjunctivae and EOM are normal.  Cardiovascular: Normal rate.  Pulmonary/Chest: Effort normal.  Abdominal: He exhibits no distension.  Musculoskeletal: Normal range of motion.  Neurological: He is alert and oriented to person, place, and time. No cranial nerve deficit. Coordination normal.  Skin: Skin is warm and dry. He is not diaphoretic.     Psychiatric: He has a normal mood and affect.  Nursing note and vitals reviewed.   No results found for: HGBA1C  Lab Results  Component Value Date   WBC 6.2 06/03/2017   HGB 15.2 06/03/2017   HCT 47.0 06/03/2017   MCV 83.2 06/03/2017   PLT 207 02/07/2016    Lab Results  Component Value Date   CREATININE 1.13 02/07/2016   BUN 15 02/07/2016   NA 141 02/07/2016   K 4.0 02/07/2016   CL 100 02/07/2016   CO2 25 02/07/2016    Lab Results  Component Value Date   ALT 23 02/07/2016   AST 28 02/07/2016   ALKPHOS 73 02/07/2016   BILITOT 0.8 02/07/2016    Lab Results  Component Value Date   TSH 1.130 02/07/2016    No results found for: CHOL, HDL, LDLCALC, LDLDIRECT, TRIG, CHOLHDL   ASSESSMENT AND PLAN:  Nathan Bruce was seen today for rash.  Diagnoses and all orders for this visit:  Acute eczema of hand: Patient clearly having an eczema flare.  Previous labs reviewed and show he has been good health from a metabolic standpoint.  Blood pressure elevated today however this is not his baseline.  I have asked that he monitor this on outpatient basis and follow-up with Dr. Chilton Si. -     triamcinolone ointment (KENALOG) 0.5 %; Apply 1 application topically 2 (two) times daily. -     hydrOXYzine (ATARAX/VISTARIL) 10 MG tablet;  Take 1-3 tablets (10-30 mg total) by mouth 3 (three) times daily as needed.    The patient is advised to call or return to clinic if he does not see an improvement in symptoms, or to seek the care of the closest emergency department if he worsens with the above plan.   Deliah Boston, MHS, PA-C Primary Care at [facility] 10/31/2017 1:48 PM

## 2017-10-30 NOTE — Patient Instructions (Addendum)
  Try the ointment and itch pills and come back in two weeks if your not getting better.  Come back in sooner if you are worsenig.    IF you received an x-ray today, you will receive an invoice from [facility]. Please contact [facility] at [phone] with questions or concerns regarding your invoice.   IF you received labwork today, you will receive an invoice from [facility]. Please contact [facility] at [phone] with questions or concerns regarding your invoice.   Our billing staff will not be able to assist you with questions regarding bills from these companies.  You will be contacted with the lab results as soon as they are available. The fastest way to get your results is to activate your My Chart account. Instructions are located on the last page of this paperwork. If you have not heard from us regarding the results in 2 weeks, please contact this office.

## 2017-11-02 ENCOUNTER — Telehealth: Payer: Self-pay | Admitting: Physician Assistant

## 2017-11-02 NOTE — Telephone Encounter (Signed)
Pt.'s daughter called to report patient was experiencing a shaking side effect associated with the medication for hands itching prescribed during his last visit with PA Clark. (Caller could not provide name of medication) Daughter also wished to inform practice that her father was not taking the medication as directed, applying only once a day at minimal amounts.

## 2017-11-03 NOTE — Telephone Encounter (Signed)
Message from dtr sent to Nathan BostonMichael Bruce, re: side effects of ? medication

## 2017-11-05 NOTE — Telephone Encounter (Signed)
Lets see him back. Deliah BostonMichael Amoreena Neubert, MS, PA-C 8:54 AM, 11/05/2017

## 2017-11-05 NOTE — Telephone Encounter (Signed)
Purcell NailsMichael Clark's message sent to scheduling pool to call pt & make appt

## 2017-11-06 ENCOUNTER — Telehealth: Payer: Self-pay | Admitting: Physician Assistant

## 2017-11-06 NOTE — Telephone Encounter (Signed)
Called pt. To schedule OV per PA's instructions. Ptwill call back after looking over schedule.

## 2017-11-18 ENCOUNTER — Encounter: Payer: Self-pay | Admitting: Physician Assistant

## 2017-11-18 ENCOUNTER — Other Ambulatory Visit: Payer: Self-pay

## 2017-11-18 ENCOUNTER — Ambulatory Visit (INDEPENDENT_AMBULATORY_CARE_PROVIDER_SITE_OTHER): Payer: Medicare Other | Admitting: Physician Assistant

## 2017-11-18 VITALS — BP 122/64 | HR 99 | Temp 98.7°F | Resp 16 | Ht 63.0 in | Wt 156.8 lb

## 2017-11-18 DIAGNOSIS — L282 Other prurigo: Secondary | ICD-10-CM

## 2017-11-18 MED ORDER — PERMETHRIN 5 % EX CREA: 1.0000 "application " | TOPICAL_CREAM | Freq: Once | CUTANEOUS | 1 refills | Status: AC

## 2017-11-18 NOTE — Patient Instructions (Signed)
     IF you received an x-ray today, you will receive an invoice from [place] [facility]. Please contact [facility] [facility] at [phone] with questions or concerns regarding your invoice.   IF you received labwork today, you will receive an invoice from [facility]. Please contact [facility] at [phone] with questions or concerns regarding your invoice.   Our billing staff will not be able to assist you with questions regarding bills from these companies.  You will be contacted with the lab results as soon as they are available. The fastest way to get your results is to activate your My Chart account. Instructions are located on the last page of this paperwork. If you have not heard from us regarding the results in 2 weeks, please contact this office.     

## 2017-11-18 NOTE — Progress Notes (Signed)
11/18/2017 12:22 PM   DOB: 1945-11-03 / MRN: [id number]  SUBJECTIVE:  Nathan Bruce is a 72 y.o. male presenting for recheck of rash. Symptoms present for about 1 month and he is worsening.   He has tried triamcinolone and this is not helping .  He has No Known Allergies.   He  has a past medical history of Food allergy and Hypertension.    He  reports that he quit smoking about 12 years ago. His smoking use included cigarettes. He has never used smokeless tobacco. He reports that he does not drink alcohol or use drugs. He  has no sexual activity history on file. The patient  has a past surgical history that includes Lumbar disc surgery (2011); I&D extremity (Left, 08/22/2013); Percutaneous pinning (Left, 08/22/2013); Appendectomy; and Tooth extraction.  His family history is not on file.  Review of Systems  Skin: Positive for itching and rash.    The problem list and medications were reviewed and updated by myself where necessary and exist elsewhere in the encounter.   OBJECTIVE:  BP 122/64   Pulse 99   Temp 98.7 F (37.1 C)   Resp 16   Ht 5\' 3"  (1.6 m)   Wt 156 lb 12.8 oz (71.1 kg)   SpO2 98%   BMI 27.78 kg/m   Wt Readings from Last 3 Encounters:  11/18/17 156 lb 12.8 oz (71.1 kg)  10/30/17 157 lb 9.6 oz (71.5 kg)  06/03/17 159 lb (72.1 kg)   Temp Readings from Last 3 Encounters:  11/18/17 98.7 F (37.1 C)  10/30/17 98.2 F (36.8 C)  06/03/17 98.2 F (36.8 C) (Oral)   BP Readings from Last 3 Encounters:  11/18/17 122/64  10/30/17 (!) 164/74  06/03/17 140/72   Pulse Readings from Last 3 Encounters:  11/18/17 99  10/30/17 64  06/03/17 66    Physical Exam  Constitutional: He is oriented to person, place, and time. He appears well-developed. He does not appear ill.  Eyes: Pupils are equal, round, and reactive to light. Conjunctivae and EOM are normal.  Cardiovascular: Normal rate.  Pulmonary/Chest: Effort normal.  Abdominal: He exhibits no distension.     Musculoskeletal: Normal range of motion.  Neurological: He is alert and oriented to person, place, and time. No cranial nerve deficit. Coordination normal.  Skin: Skin is warm and dry. Rash (xerotic rash present about the bilateral hands) noted. He is not diaphoretic.  Psychiatric: He has a normal mood and affect.  Nursing note and vitals reviewed.   No results found for: HGBA1C  Lab Results  Component Value Date   WBC 6.2 06/03/2017   HGB 15.2 06/03/2017   HCT 47.0 06/03/2017   MCV 83.2 06/03/2017   PLT 207 02/07/2016    Lab Results  Component Value Date   CREATININE 1.13 02/07/2016   BUN 15 02/07/2016   NA 141 02/07/2016   K 4.0 02/07/2016   CL 100 02/07/2016   CO2 25 02/07/2016    Lab Results  Component Value Date   ALT 23 02/07/2016   AST 28 02/07/2016   ALKPHOS 73 02/07/2016   BILITOT 0.8 02/07/2016    Lab Results  Component Value Date   TSH 1.130 02/07/2016    No results found for: CHOL, HDL, LDLCALC, LDLDIRECT, TRIG, CHOLHDL   ASSESSMENT AND PLAN:  [name] was seen today for 2 week follow-up.  Diagnoses and all orders for this visit:  Pruritic rash -     Sedimentation Rate -  CBC with Differential -     Ambulatory referral to Dermatology -     Dermatology pathology -     permethrin (ELIMITE) 5 % cream; Apply 1 application topically once for 1 dose. Apply from the neck down and leave on for 12 hours then shower.  Repeat in 7 days.    The patient is advised to call or return to clinic if he does not see an improvement in symptoms, or to seek the care of the closest emergency department if he worsens with the above plan.   Deliah Boston, MHS, PA-C Primary Care at [facility] 11/18/2017 12:22 PM

## 2017-11-19 LAB — SEDIMENTATION RATE: Sed Rate: 12 mm/hr (ref 0–30)

## 2017-11-19 LAB — CBC WITH DIFFERENTIAL/PLATELET
BASOS: 0 %
Basophils Absolute: 0 10*3/uL (ref 0.0–0.2)
EOS (ABSOLUTE): 0.4 10*3/uL (ref 0.0–0.4)
EOS: 7 %
HEMATOCRIT: 44.3 % (ref 37.5–51.0)
HEMOGLOBIN: 14.1 g/dL (ref 13.0–17.7)
IMMATURE GRANULOCYTES: 0 %
Immature Grans (Abs): 0 10*3/uL (ref 0.0–0.1)
LYMPHS ABS: 1.1 10*3/uL (ref 0.7–3.1)
Lymphs: 20 %
MCH: 26.7 pg (ref 26.6–33.0)
MCHC: 31.8 g/dL (ref 31.5–35.7)
MCV: 84 fL (ref 79–97)
MONOCYTES: 7 %
MONOS ABS: 0.4 10*3/uL (ref 0.1–0.9)
Neutrophils Absolute: 3.6 10*3/uL (ref 1.4–7.0)
Neutrophils: 66 %
Platelets: 199 10*3/uL (ref 150–450)
RBC: 5.29 x10E6/uL (ref 4.14–5.80)
RDW: 14.5 % (ref 12.3–15.4)
WBC: 5.4 10*3/uL (ref 3.4–10.8)

## 2017-11-26 NOTE — Progress Notes (Signed)
Please call.  His path report shows eczema vs mite infestation.  Neither are dangerous so overall reassuring.  I gave him permethrin at his last visit.  Has he tried this? If so is he better?  If not I can send some prednisone to the pharmacy.  Please discuss with his daughter and write me back so I can prescribe as needed.  Thank you. Nathan BostonMichael Sachiko Methot, MS, PA-C 9:56 AM, 11/26/2017

## 2017-12-05 ENCOUNTER — Telehealth: Payer: Self-pay

## 2017-12-05 NOTE — Telephone Encounter (Signed)
Copied from CRM [id number]. Topic: General - Other >> Dec 05, 2017 12:17 PM Marylen PontoMcneil, Ja-Kwan wrote: Reason for CRM: Pt requests lab results be mailed to home address.

## 2017-12-05 NOTE — Telephone Encounter (Signed)
Message pt wanted lab results mailed to home.  Sent in mail today.

## 2018-06-17 ENCOUNTER — Ambulatory Visit (INDEPENDENT_AMBULATORY_CARE_PROVIDER_SITE_OTHER): Payer: Medicare Other

## 2018-06-17 ENCOUNTER — Encounter: Payer: Self-pay | Admitting: Family Medicine

## 2018-06-17 ENCOUNTER — Ambulatory Visit (INDEPENDENT_AMBULATORY_CARE_PROVIDER_SITE_OTHER): Payer: Medicare Other | Admitting: Family Medicine

## 2018-06-17 VITALS — BP 103/54 | HR 75 | Temp 98.8°F | Resp 17 | Wt 125.0 lb

## 2018-06-17 DIAGNOSIS — R600 Localized edema: Secondary | ICD-10-CM | POA: Diagnosis not present

## 2018-06-17 DIAGNOSIS — L308 Other specified dermatitis: Secondary | ICD-10-CM

## 2018-06-17 DIAGNOSIS — R05 Cough: Secondary | ICD-10-CM

## 2018-06-17 DIAGNOSIS — R5383 Other fatigue: Secondary | ICD-10-CM | POA: Diagnosis not present

## 2018-06-17 DIAGNOSIS — R059 Cough, unspecified: Secondary | ICD-10-CM

## 2018-06-17 DIAGNOSIS — R945 Abnormal results of liver function studies: Secondary | ICD-10-CM

## 2018-06-17 MED ORDER — CETIRIZINE HCL 10 MG PO TABS: 10.0000 mg | ORAL_TABLET | Freq: Every day | ORAL | 2 refills | Status: DC

## 2018-06-17 MED ORDER — POTASSIUM CHLORIDE ER 10 MEQ PO TBCR: 10.0000 meq | EXTENDED_RELEASE_TABLET | Freq: Two times a day (BID) | ORAL | 0 refills | Status: DC

## 2018-06-17 MED ORDER — TRIAMCINOLONE 0.1 % CREAM:EUCERIN CREAM 1:1: 1.0000 "application " | TOPICAL_CREAM | Freq: Two times a day (BID) | CUTANEOUS | 0 refills | Status: DC | PRN

## 2018-06-17 MED ORDER — METHYLPREDNISOLONE 4 MG PO TBPK: ORAL_TABLET | ORAL | 0 refills | Status: DC

## 2018-06-17 MED ORDER — FUROSEMIDE 20 MG PO TABS: 20.0000 mg | ORAL_TABLET | Freq: Every day | ORAL | 0 refills | Status: DC

## 2018-06-17 NOTE — Progress Notes (Signed)
2/25/20203:24 PM  Nathan Bruce 01-30-1946, 73 y.o. male [id number]  Chief Complaint  Patient presents with  . Foot Swelling    having foot swelling started today  . Rash    reoccuring, went to [place] for tx and a "worm" was found, when he [place] tx was [place] and nothing was found. Brought records from those encounters  . Fall    had a fall, has been having some confusion. Went home to get tx and lost a lot of weight. Daughter is very concerned    HPI:   Patient is a 73 y.o. male with past medical history significant for HLP who presents today with several concerns  Here with daughter and wife Daughter serves as interpreter Had rash in July 2019 - bx eczema vs mite infestation End of 2019 eczema got really bad, unable to see dermatologist so he decided to go to [place] for care He was hospitalized for 1-2 months Patient states that he was told he had pneumonia and a parasitic infection, treated, skin got better Skin cleared completely while he was in [place] Arrived to [place] 3 weeks ago and rash and swelling of legs is starting to return - same as prior Very itchy and painful Has rash on axilla, arms, waistline, legs Nobody else in the household with skin problems He does not like to use lotion  Eating better, denies abd pain, no nausea or vomiting but has some sore throat and post nasal drainage Needs dental work No fever or chills, but he is always cold Mild occ dry cough, no SOB Normal bowels, no chest pain, orthopnea, PND Still feeling a bit tired  Had recent episode of feeling weak in the restroom and almost feel but denies any hit of head or LOC He was confused while in [place] but now thinking better more clear  Records [place] reviewed Medical records in [place] reviewed with aide of interpreter  Medications reconciled and reviewed by me: meds taking currently atrovastatin 10mg  ginko multivtamin Ace inhibitor (perindopril arginine) 5mg  once a  day vinpocetne 10mg  (supplement for memory, energy and weight loss) Asa 81mg  hctz 25mg    Skin medications (stopped taking once skin got better) Metronidazole 250mg  Arginin hydroclorid 300mg   desloratidine 5mg  chlopheniramine maleate 4mg  methylprednisone 4mg   Paracetamol 500mg  as needed for fever He was also treated with moxifloxacin  Per google translate he was told he had hepatitis and skin allergy Labs from [place] - + fasciolae spp serum 0.79, borderline negative/inderterminate AST 125/ALT 100, otherwise normal cmp Normal cbc incl eos and plts Normal UA   Fall Risk  06/17/2018 11/18/2017 10/30/2017 06/03/2017 06/07/2016  Falls in the past year? 1 No No No No  Injury with Fall? 0 - - - -     Depression screen [facility] 2/9 06/17/2018 11/18/2017 10/30/2017  Decreased Interest 0 0 0  Down, Depressed, Hopeless 0 0 0  PHQ - 2 Score 0 0 0  Altered sleeping 0 - -  Tired, decreased energy 0 - -  Change in appetite 0 - -  Feeling bad or failure about yourself  0 - -  Trouble concentrating 0 - -  Moving slowly or fidgety/restless 0 - -  Suicidal thoughts 0 - -  PHQ-9 Score 0 - -  Difficult doing work/chores Not difficult at all - -    No Known Allergies  Prior to Admission medications   Medication Sig Start Date End Date Taking? Authorizing Provider  atorvastatin (LIPITOR) 10 MG  tablet Take 10 mg by mouth daily.   Yes [provider]  Ginkgo Biloba 120 MG CAPS Take by mouth.   Yes [provider]  metroNIDAZOLE (FLAGYL) 250 MG tablet Take 250 mg by mouth 3 (three) times daily.   Yes [provider]  Multiple Vitamins-Minerals (MULTIVITAMIN WITH MINERALS) tablet Take 1 tablet by mouth daily.   Yes [provider]  Vinpocetine POWD by Does not apply route.   Yes [provider]    Past Medical History:  Diagnosis Date  . Food allergy    Shellfish allergy  . Hypertension     Past Surgical History:  Procedure Laterality Date  .  APPENDECTOMY    . I&D EXTREMITY Left 08/22/2013   Procedure: IRRIGATION AND DEBRIDEMENT EXTREMITY;  Surgeon: Tami RibasKevin R Kuzma, MD;  [facility];  Service: Orthopedics;  Laterality: Left;  left ring finger  . LUMBAR DISC SURGERY  2011   in [place]   . PERCUTANEOUS PINNING Left 08/22/2013   Procedure: PERCUTANEOUS PINNING EXTREMITY;  Surgeon: Tami RibasKevin R Kuzma, MD;  [facility];  Service: Orthopedics;  Laterality: Left;  left ring finger  . TOOTH EXTRACTION      Social History   Tobacco Use  . Smoking status: Former Smoker    Types: Cigarettes    Last attempt to quit: 04/23/2005    Years since quitting: 13.1  . Smokeless tobacco: Never Used  Substance Use Topics  . Alcohol use: No    Alcohol/week: 0.0 standard drinks    No family history on file.  ROS Per hpi, otherwise negative  OBJECTIVE:  Blood pressure (!) 103/54, pulse 75, temperature 98.8 F (37.1 C), temperature source Oral, resp. rate 17, weight 125 lb (56.7 kg), SpO2 98 %. Body mass index is 22.14 kg/m.   Wt Readings from Last 3 Encounters:  06/17/18 125 lb (56.7 kg)  11/18/17 156 lb 12.8 oz (71.1 kg)  10/30/17 157 lb 9.6 oz (71.5 kg)    BP Readings from Last 3 Encounters:  06/17/18 (!) 103/54  11/18/17 122/64  10/30/17 (!) 164/74   Physical Exam Vitals signs and nursing note reviewed.  Constitutional:      Appearance: He is well-developed.  HENT:     Head: Normocephalic and atraumatic.     Right Ear: Hearing, tympanic membrane, ear canal and external ear normal.     Left Ear: Hearing, tympanic membrane, ear canal and external ear normal.     Nose: Congestion present.     Mouth/Throat:     Pharynx: Posterior oropharyngeal erythema present. No oropharyngeal exudate.  Eyes:     General: No scleral icterus.    Extraocular Movements: Extraocular movements intact.     Conjunctiva/sclera: Conjunctivae normal.     Pupils: Pupils are equal, round, and reactive to light.  Neck:     Musculoskeletal: Neck supple.      Thyroid: No thyromegaly or thyroid tenderness.  Cardiovascular:     Rate and Rhythm: Normal rate and regular rhythm.     Pulses: Normal pulses.     Heart sounds: No murmur. No friction rub. No gallop.   Pulmonary:     Effort: Pulmonary effort is normal.     Breath sounds: Normal breath sounds. No wheezing, rhonchi or rales.  Abdominal:     General: Bowel sounds are normal. There is no distension.     Palpations: There is no fluid wave, hepatomegaly or splenomegaly.     Tenderness: There is no abdominal tenderness.  Musculoskeletal:     Right lower leg: Edema (pitting, +2, to knees, both legs) present.     Left lower leg: Edema present.  Lymphadenopathy:     Cervical: No cervical adenopathy.  Skin:    General: Skin is warm and dry.     Findings: Rash (large erytematous patches over extensor surface of arms, over calves, across belt line,. axilla more macular) present.  Neurological:     Mental Status: He is alert and oriented to person, place, and time.     Cranial Nerves: No cranial nerve deficit.     Motor: No weakness.     Gait: Gait abnormal.     Deep Tendon Reflexes: Reflexes normal.    Dg Chest 2 View  Result Date: 06/17/2018 CLINICAL DATA:  Initial evaluation for acute cough, recent pneumonia. EXAM: CHEST - 2 VIEW COMPARISON:  Prior radiograph from 06/07/2016. FINDINGS: The cardiac and mediastinal silhouettes are stable in size and contour, and remain within normal limits. The lungs are normally inflated. Mild prominence of the bibasilar pulmonary markings without focal infiltrate. No edema or effusion. No pneumothorax. No acute osseous abnormality identified. IMPRESSION: No radiographic evidence for active cardiopulmonary disease. Electronically Signed   By: Rise Mu M.D.   On: 06/17/2018 16:51    ASSESSMENT and PLAN  1. Abnormal liver function More than 50%of this 45 min visit was spent in counseling and coordination of care. Unclear cause of recent  prolonged hospitalization in [place]. He however is overall feeling better, other than his rash worsening. There is ? About parasitic infection. Lab serology available does not support, but I am sure that not all records are available for review. Will start doing basic workup for abnormal LFTs. Discussed avoidance of liver toxins.  Consider ID and GI referral, pending results - CBC with Differential/Platelet - Comprehensive metabolic panel - Acute Hep Panel & Hep B Surface Ab - Hepatitis C antibody - US Abdomen Limited RUQ; Future - TSH  2. Bilateral leg edema Concerned that edema is related to worsening liver function.  Hold current BP meds and start lasix 20mg  once a day with KCL. Reviewed new med r/se/b  3. Cough Per patient much improved and recently treated for PNA. Repeat cxr normal - DG Chest 2 View; Future  4. Fatigue, unspecified type - TSH  5. Other eczema Extensive distribution. Restarted oral steroids and antihistamine. Discussed skin care, provided patient handouts from national eczema association. Discussed use of topical steroids. Consider derm referral.   Other orders - Ginkgo Biloba 120 MG CAPS; Take by mouth. - Multiple Vitamins-Minerals (MULTIVITAMIN WITH MINERALS) tablet; Take 1 tablet by mouth daily. - Vinpocetine POWD; by Does not apply route. - atorvastatin (LIPITOR) 10 MG tablet; Take 10 mg by mouth daily. - furosemide (LASIX) 20 MG tablet; Take 1 tablet (20 mg total) by mouth daily. - potassium chloride (K-DUR) 10 MEQ tablet; Take 1 tablet (10 mEq total) by mouth 2 (two) times daily. - methylPREDNISolone (MEDROL DOSEPAK) 4 MG TBPK tablet; Take per pack instructions - cetirizine (ZYRTEC) 10 MG tablet; Take 1 tablet (10 mg total) by mouth daily. - Triamcinolone Acetonide (TRIAMCINOLONE 0.1 % CREAM : EUCERIN) CREA; Apply 1 application topically 2 (two) times daily as needed.  Return in about 2 weeks (around 07/01/2018).    Myles Lipps, MD Primary Care at  [facility] [address] [place], [place] [address] Ph.  [phone] Fax [phone]

## 2018-06-17 NOTE — Patient Instructions (Signed)
° ° ° °  If you have lab work done today you will be contacted with your lab results within the next 2 weeks.  If you have not heard from us then please contact us. The fastest way to get your results is to register for My Chart. ° ° °IF you received an x-ray today, you will receive an invoice from [facility]. Please contact [facility] at [phone] with questions or concerns regarding your invoice.  ° °IF you received labwork today, you will receive an invoice from [facility]. Please contact [facility] at [phone] with questions or concerns regarding your invoice.  ° °Our billing staff will not be able to assist you with questions regarding bills from these companies. ° °You will be contacted with the lab results as soon as they are available. The fastest way to get your results is to activate your My Chart account. Instructions are located on the last page of this paperwork. If you have not heard from us regarding the results in 2 weeks, please contact this office. °  ° ° ° °

## 2018-06-18 LAB — COMPREHENSIVE METABOLIC PANEL
ALT: 57 IU/L — ABNORMAL HIGH (ref 0–44)
AST: 95 IU/L — ABNORMAL HIGH (ref 0–40)
Albumin/Globulin Ratio: 1 — ABNORMAL LOW (ref 1.2–2.2)
Albumin: 3.4 g/dL — ABNORMAL LOW (ref 3.7–4.7)
Alkaline Phosphatase: 50 IU/L (ref 39–117)
BUN/Creatinine Ratio: 13 (ref 10–24)
BUN: 10 mg/dL (ref 8–27)
Bilirubin Total: 0.4 mg/dL (ref 0.0–1.2)
CO2: 26 mmol/L (ref 20–29)
Calcium: 9 mg/dL (ref 8.6–10.2)
Chloride: 103 mmol/L (ref 96–106)
Creatinine, Ser: 0.8 mg/dL (ref 0.76–1.27)
GFR calc Af Amer: 102 mL/min/{1.73_m2} (ref >59)
GFR calc non Af Amer: 88 mL/min/{1.73_m2} (ref >59)
Globulin, Total: 3.4 g/dL (ref 1.5–4.5)
Glucose: 108 mg/dL — ABNORMAL HIGH (ref 65–99)
Potassium: 4.3 mmol/L (ref 3.5–5.2)
Sodium: 141 mmol/L (ref 134–144)
Total Protein: 6.8 g/dL (ref 6.0–8.5)

## 2018-06-18 LAB — CBC WITH DIFFERENTIAL/PLATELET
Basophils Absolute: 0 10*3/uL (ref 0.0–0.2)
Basos: 1 %
EOS (ABSOLUTE): 0.7 10*3/uL — ABNORMAL HIGH (ref 0.0–0.4)
Eos: 10 %
Hematocrit: 34.8 % — ABNORMAL LOW (ref 37.5–51.0)
Hemoglobin: 10.8 g/dL — ABNORMAL LOW (ref 13.0–17.7)
Immature Grans (Abs): 0 10*3/uL (ref 0.0–0.1)
Immature Granulocytes: 0 %
Lymphocytes Absolute: 0.8 10*3/uL (ref 0.7–3.1)
Lymphs: 11 %
MCH: 24.4 pg — ABNORMAL LOW (ref 26.6–33.0)
MCHC: 31 g/dL — ABNORMAL LOW (ref 31.5–35.7)
MCV: 79 fL (ref 79–97)
Monocytes Absolute: 0.6 10*3/uL (ref 0.1–0.9)
Monocytes: 9 %
Neutrophils Absolute: 4.6 10*3/uL (ref 1.4–7.0)
Neutrophils: 69 %
Platelets: 302 10*3/uL (ref 150–450)
RBC: 4.43 x10E6/uL (ref 4.14–5.80)
RDW: 15.3 % (ref 11.6–15.4)
WBC: 6.7 10*3/uL (ref 3.4–10.8)

## 2018-06-18 LAB — ACUTE HEP PANEL AND HEP B SURFACE AB
Hep A IgM: NEGATIVE
Hep B C IgM: NEGATIVE
Hep C Virus Ab: <0.1 s/co ratio (ref 0.0–0.9)
Hepatitis B Surf Ab Quant: 12.7 m[IU]/mL (ref >9.9)
Hepatitis B Surface Ag: NEGATIVE

## 2018-06-18 LAB — TSH: TSH: 1.95 u[IU]/mL (ref 0.450–4.500)

## 2018-06-26 ENCOUNTER — Telehealth: Payer: Self-pay | Admitting: Family Medicine

## 2018-06-26 ENCOUNTER — Other Ambulatory Visit: Payer: Self-pay

## 2018-06-26 MED ORDER — TRIAMCINOLONE 0.1 % CREAM:EUCERIN CREAM 1:1: 1.0000 "application " | TOPICAL_CREAM | Freq: Two times a day (BID) | CUTANEOUS | 0 refills | Status: DC | PRN

## 2018-06-26 NOTE — Telephone Encounter (Signed)
Copied from CRM [id number]. Topic: Quick Communication - Rx Refill/Question >> Jun 26, 2018  8:56 AM Wyonia Hough E wrote: Medication: Triamcinolone Acetonide (TRIAMCINOLONE 0.1 % CREAM : EUCERIN) CREA  Has the patient contacted their pharmacy? Yes - Pharmacy stated the Rx didn't have any directions so they sent it back / Pt did not get this Rx / please advise   Preferred Pharmacy (with phone number or street name): [facility] [phone] [place], [place] - [address] [phone] (Phone) [phone] (Fax)    Agent: Please be advised that RX refills may take up to 3 business days. We ask that you follow-up with your pharmacy.

## 2018-06-26 NOTE — Telephone Encounter (Signed)
Resent over with instructions.

## 2018-07-01 ENCOUNTER — Ambulatory Visit: Payer: Medicare Other | Admitting: Family Medicine

## 2018-07-11 ENCOUNTER — Other Ambulatory Visit: Payer: Self-pay | Admitting: Family Medicine

## 2018-07-11 NOTE — Telephone Encounter (Signed)
Requested medication (s) are due for refill today: yes  Requested medication (s) are on the active medication list: yes  Last refill:  All were filled 06/17/18  Future visit scheduled: yes in 1 month  Notes to clinic:  Pt missed f/u appt on 07/01/18   Requested Prescriptions  Pending Prescriptions Disp Refills   furosemide (LASIX) 20 MG tablet [Pharmacy Med Name: FUROSEMIDE 20 MG TABLET] 14 tablet 0    Sig: TAKE 1 TABLET BY MOUTH EVERY DAY     Cardiovascular:  Diuretics - Loop Passed - 07/11/2018  9:28 AM      Passed - K in normal range and within 360 days    Potassium  Date Value Ref Range Status  06/17/2018 4.3 3.5 - 5.2 mmol/L Final         Passed - Ca in normal range and within 360 days    Calcium  Date Value Ref Range Status  06/17/2018 9.0 8.6 - 10.2 mg/dL Final         Passed - Na in normal range and within 360 days    Sodium  Date Value Ref Range Status  06/17/2018 141 134 - 144 mmol/L Final         Passed - Cr in normal range and within 360 days    Creatinine, Ser  Date Value Ref Range Status  06/17/2018 0.80 0.76 - 1.27 mg/dL Final         Passed - Last BP in normal range    BP Readings from Last 1 Encounters:  06/17/18 (!) 103/54         Passed - Valid encounter within last 6 months    Recent Outpatient Visits          3 weeks ago Abnormal liver function   [facility], Meda Coffee, MD   7 months ago Pruritic rash   [facility], Marolyn Hammock, PA-C   8 months ago Acute eczema of hand   [facility], Marolyn Hammock, PA-C   1 year ago RLQ abdominal pain   [facility], Asencion Partridge, MD   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     Endocrinology:  Minerals - Potassium Supplementation Passed - 07/11/2018  9:28 AM      Passed - K in normal range and within 360 days    Potassium  Date Value Ref Range Status  06/17/2018 4.3 3.5 - 5.2 mmol/L Final         Passed - Cr in normal range and within 360 days    Creatinine, Ser  Date Value Ref Range Status  06/17/2018 0.80 0.76 - 1.27 mg/dL Final         Passed - Valid encounter within last 12 months    Recent Outpatient Visits          3 weeks ago Abnormal liver function   [facility], Meda Coffee, MD   7 months ago  Pruritic rash   [facility], Marolyn Hammock, PA-C   8 months ago Acute eczema of hand   [facility], Marolyn Hammock, PA-C   1 year ago RLQ abdominal pain   [facility], Asencion Partridge, MD   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, right   [facility] at [place], [place], [place]      Future Appointments            In 1 month Leretha Pol, Meda Coffee, MD [facility] at [place], [facility]          methylPREDNISolone (MEDROL DOSEPAK) 4 MG TBPK tablet [Pharmacy Med Name: METHYLPREDNISOLONE 4 MG DOSEPK]  0    Sig: TAKE 6 TABLETS ON DAY 1 AS DIRECTED ON PACKAGE AND DECREASE BY 1 TAB EACH DAY FOR A TOTAL OF 6 DAYS     Not Delegated - Endocrinology:  Oral Corticosteroids Failed - 07/11/2018  9:28 AM      Failed - This refill cannot be delegated      Passed - Last BP in normal range    BP Readings from Last 1 Encounters:  06/17/18 (!) 103/54         Passed - Valid encounter within last 6 months    Recent Outpatient Visits          3 weeks ago Abnormal liver function   [facility], Meda Coffee, MD   7 months ago Pruritic rash   [facility], Marolyn Hammock, PA-C   8 months ago Acute eczema of hand   [facility], Marolyn Hammock, PA-C   1 year ago RLQ abdominal pain   [facility], Asencion Partridge,  MD   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

## 2018-07-12 NOTE — Telephone Encounter (Signed)
It appears these meds were prescribed by Dr. Leretha Pol for leg swelling.  Check status as he may not need to have a refill, as it appears that these may have been for temporary use.  Check status and please advise me or Dr. Leretha Pol of symptoms to decide on further refills.  Thanks

## 2018-07-16 ENCOUNTER — Telehealth (INDEPENDENT_AMBULATORY_CARE_PROVIDER_SITE_OTHER): Payer: Medicare Other | Admitting: Family Medicine

## 2018-07-16 ENCOUNTER — Other Ambulatory Visit: Payer: Self-pay

## 2018-07-16 DIAGNOSIS — D721 Eosinophilia, unspecified: Secondary | ICD-10-CM

## 2018-07-16 DIAGNOSIS — Z9189 Other specified personal risk factors, not elsewhere classified: Secondary | ICD-10-CM

## 2018-07-16 DIAGNOSIS — D649 Anemia, unspecified: Secondary | ICD-10-CM

## 2018-07-16 DIAGNOSIS — R945 Abnormal results of liver function studies: Secondary | ICD-10-CM | POA: Diagnosis not present

## 2018-07-16 DIAGNOSIS — R4182 Altered mental status, unspecified: Secondary | ICD-10-CM

## 2018-07-16 DIAGNOSIS — Z789 Other specified health status: Secondary | ICD-10-CM

## 2018-07-16 DIAGNOSIS — R21 Rash and other nonspecific skin eruption: Secondary | ICD-10-CM

## 2018-07-16 NOTE — Progress Notes (Signed)
Virtual Visit via telephone Note  I connected with patient on 07/16/18 at 341pm by telephone and verified that I am speaking with the correct person using two identifiers. Nathan Bruce is currently located at home and family, daughter who also serves as interpreter, is currently with her during visit. The provider, Myles Lipps, MD is located in their home at time of visit.  I discussed the limitations, risks, security and privacy concerns of performing an evaluation and management service by telephone and the availability of in person appointments. I also discussed with the patient that there may be a patient responsible charge related to this service. The patient expressed understanding and agreed to proceed.  CC: pain in his knees  Telephone visit today for followup from last OV 2 weeks ago  HPI Last OV - dc BP meds and started lasix given edema Also started oral pred given extensive ezcema  Patient reports that he is doing better since last OV Swelling is much better but is now having pain in both his knees, making it difficult to walk. He states that knees are swollen and warm. No redness. He normally does not have knee pain. Pain started while in [place].  His skin is peeling/flaking, turning reddish/brown, no blisters or ulcers, worse pain and peeling in inner thighs Completed oral prednisone - helped a little bit  He is requesting foods but then does not want to eat because his teeth and throat are hurting Has been doing vitamins and ensures He has also been struggling with constipation  Has been checking BP at home Yesterday 128/60  Daughter worried as he seems to be getting more confused  Lab Results  Component Value Date   WBC 6.7 06/17/2018   HGB 10.8 (L) 06/17/2018   HCT 34.8 (L) 06/17/2018   MCV 79 06/17/2018   PLT 302 06/17/2018   Lab Results  Component Value Date   ALT 57 (H) 06/17/2018   AST 95 (H) 06/17/2018   ALKPHOS 50 06/17/2018   BILITOT 0.4  06/17/2018   Lab Results  Component Value Date   TSH 1.950 06/17/2018   Lab Results  Component Value Date   CREATININE 0.80 06/17/2018   BUN 10 06/17/2018   NA 141 06/17/2018   K 4.3 06/17/2018   CL 103 06/17/2018   CO2 26 06/17/2018    Hepatitis Latest Ref Rng & Units 06/17/2018  Hep B Surface Ag Negative Negative  Hep B IgM Negative Negative  Hep A IgM Negative Negative  hep C Ab negative  Fall Risk  07/16/2018 06/17/2018 11/18/2017 10/30/2017 06/03/2017  Falls in the past year? 0 1 No No No  Injury with Fall? - 0 - - -  Follow up Falls evaluation completed - - - -     Depression screen [facility] 2/9 07/16/2018 06/17/2018 11/18/2017  Decreased Interest 0 0 0  Down, Depressed, Hopeless 0 0 0  PHQ - 2 Score 0 0 0  Altered sleeping - 0 -  Tired, decreased energy - 0 -  Change in appetite - 0 -  Feeling bad or failure about yourself  - 0 -  Trouble concentrating - 0 -  Moving slowly or fidgety/restless - 0 -  Suicidal thoughts - 0 -  PHQ-9 Score - 0 -  Difficult doing work/chores - Not difficult at all -    No Known Allergies  Prior to Admission medications   Medication Sig Start Date End Date Taking? Authorizing Provider  Multiple Vitamins-Minerals (MULTIVITAMIN  WITH MINERALS) tablet Take 1 tablet by mouth daily.   Yes [provider]  potassium chloride (K-DUR) 10 MEQ tablet Take 1 tablet (10 mEq total) by mouth 2 (two) times daily. 06/17/18  Yes Myles Lipps, MD  Triamcinolone Acetonide (TRIAMCINOLONE 0.1 % CREAM : EUCERIN) CREA Apply 1 application topically 2 (two) times daily as needed. 06/26/18  Yes Myles Lipps, MD  atorvastatin (LIPITOR) 10 MG tablet Take 10 mg by mouth daily.    [provider]  cetirizine (ZYRTEC) 10 MG tablet Take 1 tablet (10 mg total) by mouth daily. Patient not taking: Reported on 07/16/2018 06/17/18   Myles Lipps, MD  furosemide (LASIX) 20 MG tablet Take 1 tablet (20 mg total) by mouth daily. Patient not taking: Reported  on 07/16/2018 06/17/18   Myles Lipps, MD  Ginkgo Biloba 120 MG CAPS Take by mouth.    [provider]  methylPREDNISolone (MEDROL DOSEPAK) 4 MG TBPK tablet Take per pack instructions Patient not taking: Reported on 07/16/2018 06/17/18   Myles Lipps, MD  Vinpocetine POWD by Does not apply route.    [provider]    Past Medical History:  Diagnosis Date  . Food allergy    Shellfish allergy  . Hypertension     Past Surgical History:  Procedure Laterality Date  . APPENDECTOMY    . I&D EXTREMITY Left 08/22/2013   Procedure: IRRIGATION AND DEBRIDEMENT EXTREMITY;  Surgeon: Tami Ribas, MD;  Location[facility];  Service: Orthopedics;  Laterality: Left;  left ring finger  . LUMBAR DISC SURGERY  2011   in [place]   . PERCUTANEOUS PINNING Left 08/22/2013   Procedure: PERCUTANEOUS PINNING EXTREMITY;  Surgeon: Tami Ribas, MD;  Location: [facility];  Service: Orthopedics;  Laterality: Left;  left ring finger  . TOOTH EXTRACTION      Social History   Tobacco Use  . Smoking status: Former Smoker    Types: Cigarettes    Last attempt to quit: 04/23/2005    Years since quitting: 13.2  . Smokeless tobacco: Never Used  Substance Use Topics  . Alcohol use: No    Alcohol/week: 0.0 standard drinks    No family history on file.  ROS  Objective  Vitals as reported by the patient: 128/60  There were no vitals filed for this visit.  ASSESSMENT and PLAN  1. Abnormal liver function - Comprehensive metabolic panel; Future  2. Language barrier  3. Anemia, unspecified type - CBC with Differential/Platelet; Future - Iron, TIBC and Ferritin Panel; Future - Reticulocytes; Future - Pathologist smear review; Future  4. Rash and nonspecific skin eruption  5. Altered mental status, unspecified altered mental status type - Vitamin B12; Future - Folate; Future  Concern for "peeling skin" needs OV with repeat labs prior.   FOLLOW-UP: 1 week, labs prior   The above  assessment and management plan was discussed with the patient. The patient verbalized understanding of and has agreed to the management plan. Patient is aware to call the clinic if symptoms persist or worsen. Patient is aware when to return to the clinic for a follow-up visit. Patient educated on when it is appropriate to go to the emergency department.    I provided 28 minutes of non-face-to-face time during this encounter.  Myles Lipps, MD Primary Care at [facility] [address] [place], [place] [address] Ph.  [phone] Fax [phone]

## 2018-07-16 NOTE — Addendum Note (Signed)
Addended by: Myles Lipps on: 07/16/2018 10:59 PM   Modules accepted: Orders

## 2018-07-16 NOTE — Addendum Note (Signed)
Addended by: Myles Lipps on: 07/16/2018 04:12 PM   Modules accepted: Orders

## 2018-07-17 ENCOUNTER — Other Ambulatory Visit: Payer: Self-pay

## 2018-07-17 ENCOUNTER — Ambulatory Visit (INDEPENDENT_AMBULATORY_CARE_PROVIDER_SITE_OTHER): Payer: Medicare Other | Admitting: Family Medicine

## 2018-07-17 DIAGNOSIS — D721 Eosinophilia, unspecified: Secondary | ICD-10-CM

## 2018-07-17 DIAGNOSIS — Z9189 Other specified personal risk factors, not elsewhere classified: Secondary | ICD-10-CM

## 2018-07-17 DIAGNOSIS — R4182 Altered mental status, unspecified: Secondary | ICD-10-CM

## 2018-07-17 DIAGNOSIS — R21 Rash and other nonspecific skin eruption: Secondary | ICD-10-CM

## 2018-07-17 DIAGNOSIS — D649 Anemia, unspecified: Secondary | ICD-10-CM

## 2018-07-17 DIAGNOSIS — R945 Abnormal results of liver function studies: Secondary | ICD-10-CM

## 2018-07-17 LAB — URINALYSIS, ROUTINE W REFLEX MICROSCOPIC
Bilirubin, UA: NEGATIVE
Glucose, UA: NEGATIVE
Leukocytes, UA: NEGATIVE
Nitrite, UA: NEGATIVE
RBC, UA: NEGATIVE
Specific Gravity, UA: 1.022 (ref 1.005–1.030)
Urobilinogen, Ur: 1 mg/dL (ref 0.2–1.0)
pH, UA: 5.5 (ref 5.0–7.5)

## 2018-07-18 LAB — IRON,TIBC AND FERRITIN PANEL
Ferritin: 828 ng/mL — ABNORMAL HIGH (ref 30–400)
Iron Saturation: 13 % — ABNORMAL LOW (ref 15–55)
Iron: 17 ug/dL — ABNORMAL LOW (ref 38–169)
Total Iron Binding Capacity: 126 ug/dL — ABNORMAL LOW (ref 250–450)
UIBC: 109 ug/dL — ABNORMAL LOW (ref 111–343)

## 2018-07-18 LAB — CBC WITH DIFFERENTIAL/PLATELET
Basophils Absolute: 0 10*3/uL (ref 0.0–0.2)
Basos: 1 %
EOS (ABSOLUTE): 0.3 10*3/uL (ref 0.0–0.4)
Eos: 5 %
Hematocrit: 32.9 % — ABNORMAL LOW (ref 37.5–51.0)
Hemoglobin: 10.2 g/dL — ABNORMAL LOW (ref 13.0–17.7)
Immature Grans (Abs): 0 10*3/uL (ref 0.0–0.1)
Immature Granulocytes: 1 %
Lymphocytes Absolute: 0.9 10*3/uL (ref 0.7–3.1)
Lymphs: 13 %
MCH: 24.6 pg — ABNORMAL LOW (ref 26.6–33.0)
MCHC: 31 g/dL — ABNORMAL LOW (ref 31.5–35.7)
MCV: 79 fL (ref 79–97)
Monocytes Absolute: 0.5 10*3/uL (ref 0.1–0.9)
Monocytes: 7 %
Neutrophils Absolute: 4.9 10*3/uL (ref 1.4–7.0)
Neutrophils: 73 %
Platelets: 433 10*3/uL (ref 150–450)
RBC: 4.15 x10E6/uL (ref 4.14–5.80)
RDW: 15.7 % — ABNORMAL HIGH (ref 11.6–15.4)
WBC: 6.6 10*3/uL (ref 3.4–10.8)

## 2018-07-18 LAB — FOLATE: Folate: 14.4 ng/mL (ref >3.0)

## 2018-07-18 LAB — STRONGYLOIDES, AB, IGG: Strongyloides, Ab, IgG: NEGATIVE

## 2018-07-18 LAB — COMPREHENSIVE METABOLIC PANEL
ALT: 73 IU/L — ABNORMAL HIGH (ref 0–44)
AST: 124 IU/L — ABNORMAL HIGH (ref 0–40)
Albumin/Globulin Ratio: 1.1 — ABNORMAL LOW (ref 1.2–2.2)
Albumin: 3.1 g/dL — ABNORMAL LOW (ref 3.7–4.7)
Alkaline Phosphatase: 36 IU/L — ABNORMAL LOW (ref 39–117)
BUN/Creatinine Ratio: 15 (ref 10–24)
BUN: 12 mg/dL (ref 8–27)
Bilirubin Total: 0.7 mg/dL (ref 0.0–1.2)
CO2: 22 mmol/L (ref 20–29)
Calcium: 9 mg/dL (ref 8.6–10.2)
Chloride: 100 mmol/L (ref 96–106)
Creatinine, Ser: 0.8 mg/dL (ref 0.76–1.27)
GFR calc Af Amer: 102 mL/min/{1.73_m2} (ref >59)
GFR calc non Af Amer: 88 mL/min/{1.73_m2} (ref >59)
Globulin, Total: 2.9 g/dL (ref 1.5–4.5)
Glucose: 87 mg/dL (ref 65–99)
Potassium: 4.3 mmol/L (ref 3.5–5.2)
Sodium: 139 mmol/L (ref 134–144)
Total Protein: 6 g/dL (ref 6.0–8.5)

## 2018-07-18 LAB — ANA,IFA RA DIAG PNL W/RFLX TIT/PATN
ANA Titer 1: NEGATIVE
Cyclic Citrullin Peptide Ab: 22 units — ABNORMAL HIGH (ref 0–19)
Rheumatoid fact SerPl-aCnc: 10.4 IU/mL (ref 0.0–13.9)

## 2018-07-18 LAB — VITAMIN B12: Vitamin B-12: 220 pg/mL — ABNORMAL LOW (ref 232–1245)

## 2018-07-18 LAB — RETICULOCYTES: Retic Ct Pct: 0.9 % (ref 0.6–2.6)

## 2018-07-18 NOTE — Progress Notes (Signed)
Lab only visit, pt not seen.  

## 2018-07-21 LAB — PATHOLOGIST SMEAR REVIEW
Basophils Absolute: 0 10*3/uL (ref 0.0–0.2)
Basos: 1 %
EOS (ABSOLUTE): 0.3 10*3/uL (ref 0.0–0.4)
Eos: 5 %
Hematocrit: 31.7 % — ABNORMAL LOW (ref 37.5–51.0)
Hemoglobin: 10 g/dL — ABNORMAL LOW (ref 13.0–17.7)
Immature Grans (Abs): 0 10*3/uL (ref 0.0–0.1)
Immature Granulocytes: 0 %
Lymphocytes Absolute: 0.9 10*3/uL (ref 0.7–3.1)
Lymphs: 14 %
MCH: 23.8 pg — ABNORMAL LOW (ref 26.6–33.0)
MCHC: 31.5 g/dL (ref 31.5–35.7)
MCV: 76 fL — ABNORMAL LOW (ref 79–97)
Monocytes Absolute: 0.5 10*3/uL (ref 0.1–0.9)
Monocytes: 7 %
Neutrophils Absolute: 4.9 10*3/uL (ref 1.4–7.0)
Neutrophils: 73 %
Path Rev PLTs: NORMAL
Path Rev WBC: NORMAL
Platelets: 419 10*3/uL (ref 150–450)
RBC: 4.2 x10E6/uL (ref 4.14–5.80)
RDW: 15.8 % — ABNORMAL HIGH (ref 11.6–15.4)
WBC: 6.6 10*3/uL (ref 3.4–10.8)

## 2018-07-22 LAB — OVA AND PARASITE EXAMINATION

## 2018-07-24 ENCOUNTER — Ambulatory Visit (INDEPENDENT_AMBULATORY_CARE_PROVIDER_SITE_OTHER): Payer: Medicare Other

## 2018-07-24 ENCOUNTER — Other Ambulatory Visit: Payer: Self-pay

## 2018-07-24 ENCOUNTER — Ambulatory Visit: Payer: Medicare Other | Admitting: Family Medicine

## 2018-07-24 ENCOUNTER — Telehealth: Payer: Self-pay

## 2018-07-24 ENCOUNTER — Telehealth (INDEPENDENT_AMBULATORY_CARE_PROVIDER_SITE_OTHER): Payer: Medicare Other | Admitting: Family Medicine

## 2018-07-24 DIAGNOSIS — R768 Other specified abnormal immunological findings in serum: Secondary | ICD-10-CM

## 2018-07-24 DIAGNOSIS — R945 Abnormal results of liver function studies: Secondary | ICD-10-CM

## 2018-07-24 DIAGNOSIS — R7989 Other specified abnormal findings of blood chemistry: Secondary | ICD-10-CM

## 2018-07-24 DIAGNOSIS — M2550 Pain in unspecified joint: Secondary | ICD-10-CM | POA: Diagnosis not present

## 2018-07-24 DIAGNOSIS — R634 Abnormal weight loss: Secondary | ICD-10-CM

## 2018-07-24 DIAGNOSIS — M25532 Pain in left wrist: Secondary | ICD-10-CM

## 2018-07-24 DIAGNOSIS — M25561 Pain in right knee: Secondary | ICD-10-CM | POA: Diagnosis not present

## 2018-07-24 DIAGNOSIS — Z1211 Encounter for screening for malignant neoplasm of colon: Secondary | ICD-10-CM

## 2018-07-24 DIAGNOSIS — E538 Deficiency of other specified B group vitamins: Secondary | ICD-10-CM

## 2018-07-24 DIAGNOSIS — L308 Other specified dermatitis: Secondary | ICD-10-CM

## 2018-07-24 DIAGNOSIS — M25562 Pain in left knee: Secondary | ICD-10-CM | POA: Diagnosis not present

## 2018-07-24 MED ORDER — DOXEPIN HCL 25 MG PO CAPS: 25.0000 mg | ORAL_CAPSULE | Freq: Every day | ORAL | 2 refills | Status: DC

## 2018-07-24 MED ORDER — FLUTICASONE PROPIONATE 50 MCG/ACT NA SUSP: 1.0000 | Freq: Two times a day (BID) | NASAL | 6 refills | Status: AC

## 2018-07-24 MED ORDER — IBUPROFEN 600 MG PO TABS: 600.0000 mg | ORAL_TABLET | Freq: Three times a day (TID) | ORAL | 0 refills | Status: DC | PRN

## 2018-07-24 NOTE — Telephone Encounter (Signed)
Due to the virtual visit not being able to change to office visit for charting, Pt shot for b12 being documented as such: [id number] [id number] EX 5/21

## 2018-07-24 NOTE — Progress Notes (Signed)
4/2/20206:20 PM  Nathan Bruce [birth year], 73 y.o., male [id number]  CC: pain, fatigue, weight loss, irritated and itchy skin  HPI:   Patient is a 73 y.o. male with past medical history significant for HLP, eczema, abnormal LFTs who presents today for routine followup  He is in clinic with his daughter that serves as interpreter Last OV a month ago Patient with significant decline and unclear picture since he traveled to [place] this winter History is difficult to elicit  1. Skin - bx in July 2019 - eczema, last OV rx oral prednisone, zyrtec and triamcinolone. Skin was better while he was on prednisone, but rash and itching has returned. He continues to bath at least daily if not more, he continues to scrub and scratch. He has started to have flaking of skin, worse on inner thigh, lower back and groin. Saw derm once.   2. Pain: having pain in areas of rash but mostly swelling and pain of left wrist and knees. He is also having some numbness and tingling of his legs and feet.   3. Having nasal congestion, runny nose, sinus pressure, sore throat, headaches. Not taking anything other than zyrtec. Denies any hearing or vision changes. Denies any nausea.   4. Weight loss. Continues to lose weight. Appetite ok. Teeth in very poor condition, makes it difficult for him to eat. Taking MVI, soft foods, ensure. He however also reports that he feels that he is not digesting well, feels he stays full for long periods of time, has occ abd pain, denies bloating, constipation or diarrhea. Sometimes does strain. Denies any black tarry stools or blood in stool.   Labs ordered at last visit Lab Results  Component Value Date   WBC 6.6 07/17/2018   HGB 10.2 (L) 07/17/2018   HCT 32.9 (L) 07/17/2018   MCV 79 07/17/2018   PLT 433 07/17/2018   Lab Results  Component Value Date   IRON 17 (L) 07/17/2018   TIBC 126 (L) 07/17/2018   FERRITIN 828 (H) 07/17/2018   Lab Results  Component Value Date   RETICCTPCT 0.9 07/17/2018   Lab Results  Component Value Date   VITAMINB12 220 (L) 07/17/2018   Lab Results  Component Value Date   FOLATE 14.4 07/17/2018    Peripheral smear - suggestive of iron deficiency  Lab Results  Component Value Date   CREATININE 0.80 07/17/2018   BUN 12 07/17/2018   NA 139 07/17/2018   K 4.3 07/17/2018   CL 100 07/17/2018   CO2 22 07/17/2018    Hepatic Function Latest Ref Rng & Units 07/17/2018 06/17/2018 02/07/2016  Total Protein 6.0 - 8.5 g/dL 6.0 6.8 8.4  Albumin 3.7 - 4.7 g/dL 3.1(L) 3.4(L) 5.2(H)  AST 0 - 40 IU/L 124(H) 95(H) 28  ALT 0 - 44 IU/L 73(H) 57(H) 23  Alk Phosphatase 39 - 117 IU/L 36(L) 50 73  Total Bilirubin 0.0 - 1.2 mg/dL 0.7 0.4 0.8   Lab Results  Component Value Date   ANA Negative 02/07/2016   RF 10.4 07/17/2018  CCP 22  RUQ [place] pending  O+P and strongyloides ab - negative  Fall Risk  07/24/2018 07/16/2018 06/17/2018 11/18/2017 10/30/2017  Falls in the past year? 0 0 1 No No  Number falls in past yr: 0 - - - -  Injury with Fall? 0 - 0 - -  Follow up - Falls evaluation completed - - -     Depression screen [facility] 2/9 07/24/2018 07/16/2018 06/17/2018  Decreased Interest 0 0 0  Down, Depressed, Hopeless 0 0 0  PHQ - 2 Score 0 0 0  Altered sleeping - - 0  Tired, decreased energy - - 0  Change in appetite - - 0  Feeling bad or failure about yourself  - - 0  Trouble concentrating - - 0  Moving slowly or fidgety/restless - - 0  Suicidal thoughts - - 0  PHQ-9 Score - - 0  Difficult doing work/chores - - Not difficult at all    No Known Allergies  Prior to Admission medications   Medication Sig Start Date End Date Taking? Authorizing Provider  atorvastatin (LIPITOR) 10 MG tablet Take 10 mg by mouth daily.    [provider]  cetirizine (ZYRTEC) 10 MG tablet Take 1 tablet (10 mg total) by mouth daily. Patient not taking: Reported on 07/16/2018 06/17/18   Rutherford Guys, MD  doxepin [facility]) 25 MG capsule Take 1  capsule (25 mg total) by mouth at bedtime. 07/24/18   Rutherford Guys, MD  fluticasone (FLONASE) 50 MCG/ACT nasal spray Place 1 spray into both nostrils 2 (two) times daily. 07/24/18   Rutherford Guys, MD  furosemide (LASIX) 20 MG tablet Take 1 tablet (20 mg total) by mouth daily. Patient not taking: Reported on 07/16/2018 06/17/18   Rutherford Guys, MD  Ginkgo Biloba 120 MG CAPS Take by mouth.    [provider]  ibuprofen (ADVIL,MOTRIN) 600 MG tablet Take 1 tablet (600 mg total) by mouth every 8 (eight) hours as needed. Take with food or milk 07/24/18   Rutherford Guys, MD  methylPREDNISolone (MEDROL DOSEPAK) 4 MG TBPK tablet Take per pack instructions Patient not taking: Reported on 07/16/2018 06/17/18   Rutherford Guys, MD  Multiple Vitamins-Minerals (MULTIVITAMIN WITH MINERALS) tablet Take 1 tablet by mouth daily.    [provider]  potassium chloride (K-DUR) 10 MEQ tablet Take 1 tablet (10 mEq total) by mouth 2 (two) times daily. 06/17/18   Rutherford Guys, MD  Triamcinolone Acetonide (TRIAMCINOLONE 0.1 % CREAM : EUCERIN) CREA Apply 1 application topically 2 (two) times daily as needed. 06/26/18   Rutherford Guys, MD  Vinpocetine POWD by Does not apply route.    [provider]    Past Medical History:  Diagnosis Date   Food allergy    Shellfish allergy   Hypertension     Past Surgical History:  Procedure Laterality Date   APPENDECTOMY     I&D EXTREMITY Left 08/22/2013   Procedure: IRRIGATION AND DEBRIDEMENT EXTREMITY;  Surgeon: Tennis Must, MD;  Location: [place];  Service: Orthopedics;  Laterality: Left;  left ring finger   LUMBAR [place] SURGERY  2011   in [place]    PERCUTANEOUS PINNING Left 08/22/2013   Procedure: PERCUTANEOUS PINNING EXTREMITY;  Surgeon: Tennis Must, MD;  Location: [place];  Service: Orthopedics;  Laterality: Left;  left ring finger   TOOTH EXTRACTION      Social History   Tobacco Use   Smoking status: Former Smoker    Types:  Cigarettes    Last attempt to quit: 04/23/2005    Years since quitting: 13.2   Smokeless tobacco: Never Used  Substance Use Topics   Alcohol use: No    Alcohol/week: 0.0 standard drinks    No family history on file.  ROS Per hpi Neg fever or chills, cough, SOB, chest pain, leg edema  OBJECTIVE:  Vitals:  T 96.5 HR 96 BP 113/71  Wt 119 lbs  Wt Readings from Last 3 Encounters:  06/17/18 125 lb (56.7 kg)  11/18/17 156 lb 12.8 oz (71.1 kg)  10/30/17 157 lb 9.6 oz (71.5 kg)    Physical Exam AAOx3, NAD, fraile Driscoll/AT, PEERL, EOMI, sclera non-icteric CTAB RRR no m/r/g +BS, not distended, generalized TTP, no rebound or guarding, no mass, no hernia, no HSM MSK: left wrist FROM, + swelling along radial side, knees FROM with mild swelling Skin: large erythematous and hyperpigmented lichenified patches along inner thighs, low back, elbows. Evidence of excoriation.  Dg Wrist Complete Left  Result Date: 07/24/2018 CLINICAL DATA:  Pain and swelling.  Remote injury.  No new injury. EXAM: LEFT WRIST - COMPLETE 3+ VIEW COMPARISON:  None. FINDINGS: There is no evidence of fracture or dislocation. There is no evidence of arthropathy or other focal bone abnormality. Soft tissues are unremarkable. IMPRESSION: Negative. Electronically Signed   By: Rolm Baptise M.D.   On: 07/24/2018 12:55   Dg Knee Complete 4 Views Left  Result Date: 07/24/2018 CLINICAL DATA:  Pain and swelling. EXAM: LEFT KNEE - COMPLETE 4+ VIEW COMPARISON:  None. FINDINGS: Small knee joint effusion is suspected. Mild medial compartment joint space narrowing with very small marginal osteophytes. Chronic calcification of the patellar tendon. Osteoarthritis of the patellofemoral joint with joint space narrowing and marginal osteophytes. 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 Probable small joint effusion. Electronically Signed   By: Nelson Chimes M.D.   On: 07/24/2018 12:50   Dg Knee  Complete 4 Views Right  Result Date: 07/24/2018 CLINICAL DATA:  Pain and swelling.  Old injury. EXAM: RIGHT KNEE - COMPLETE 4+ VIEW COMPARISON:  None. FINDINGS: Mild medial compartment and patellofemoral compartment joint space narrowing with medial compartment marginal osteophytes. Chronic calcification of the patellar tendon. Probable small joint effusion. IMPRESSION: Medial compartment more than patellofemoral compartment osteoarthritis. Probable small joint effusion. Electronically Signed   By: Nelson Chimes M.D.   On: 07/24/2018 12:51   Dg Abd 2 Views  Result Date: 07/24/2018 CLINICAL DATA:  Pain and swelling, weight loss EXAM: ABDOMEN - 2 VIEW COMPARISON:  None. FINDINGS: The bowel gas pattern is normal. There is no evidence of free air. No radio-opaque calculi or other significant radiographic abnormality is seen. IMPRESSION: Negative. Electronically Signed   By: Kathreen Devoid   On: 07/24/2018 12:51    ASSESSMENT and PLAN  1. Loss of weight Patient continues with weight loss. Might be related to poor nutritional intake given teeth pain, however having early satiety. Needs further workup. Referring to GI.  - DG Abd 2 Views; Future - Ambulatory referral to Gastroenterology  2. Abnormal liver function Might be related to elevated ferritin. There was concern for acute hepatitis and parasitic infection in [place]. Viral hepatitis serologies are normal. Eosinophiles are normal. Neg O+P x 1. Neg strongyloides. Discussed avoidance of liver toxins such as APAP, etc. Pending [place]. Referring to GI.  - DG Abd 2 Views; Future - Hemochromatosis DNA-PCR(c282y,h63d) - Comprehensive metabolic panel; Future - Ambulatory referral to Gastroenterology  3. Positive anti-CCP test Mildly weak positive. xrays do not show inflammatory arthritis, pending sed rate and crp. Continue to monitor.  - DG Wrist Complete Left; Future - DG Knee Complete 4 Views Left; Future - DG Knee Complete 4 Views Right; Future -  C-reactive protein - Sedimentation Rate  4. Multiple joint pain Favoring MSK not rheum etiologies. Ibuprofen prn. Reviewed r/se/b precautions.  - DG Wrist Complete Left; Future - DG Knee Complete  4 Views Left; Future - DG Knee Complete 4 Views Right; Future - C-reactive protein - Sedimentation Rate  5. Vitamin B12 deficiency Probable cause for neuropathy sx. Injection given today by Rock Nephew, CMA. Advised OTC daily supplement. Cont to monitor.  - CBC; Future - Vitamin B12; Future  Due to the virtual visit not being able to change to office visit for charting, Pt shot for b12 being documented as such: [id number] [facility] 0517-0031-01 EX 5/21  6. Elevated ferritin Consider heme referral - Hemochromatosis DNA-PCR(c282y,h63d) - CBC; Future - Ambulatory referral to Gastroenterology  7. Other eczema Discussed importance of supportive measures, good skin care, itch-scratch cycle. Starting doxepin at bedtime to help with itch. Reviewed r/se/b. Referring to derm.  - Ambulatory referral to Dermatology  8. Screen for colon cancer - Ambulatory referral to Gastroenterology  Other orders - doxepin (SINEQUAN) 25 MG capsule; Take 1 capsule (25 mg total) by mouth at bedtime. - ibuprofen (ADVIL,MOTRIN) 600 MG tablet; Take 1 tablet (600 mg total) by mouth every 8 (eight) hours as needed. Take with food or milk - fluticasone (FLONASE) 50 MCG/ACT nasal spray; Place 1 spray into both nostrils 2 (two) times daily.  50% of this 40 minute visit was spent on counseling and coordination of care.   followup in 4 weeks    [place], MD Primary Care at [facility] [place], [place] [address] Ph.  [phone] Fax [phone]

## 2018-07-29 LAB — HEMOCHROMATOSIS DNA-PCR(C282Y,H63D)

## 2018-07-29 LAB — C-REACTIVE PROTEIN: CRP: 27 mg/L — ABNORMAL HIGH (ref 0–10)

## 2018-07-29 LAB — SEDIMENTATION RATE: Sed Rate: 114 mm/hr — ABNORMAL HIGH (ref 0–30)

## 2018-07-30 ENCOUNTER — Ambulatory Visit (INDEPENDENT_AMBULATORY_CARE_PROVIDER_SITE_OTHER): Payer: Medicare Other | Admitting: Gastroenterology

## 2018-07-30 ENCOUNTER — Encounter: Payer: Self-pay | Admitting: Gastroenterology

## 2018-07-30 ENCOUNTER — Other Ambulatory Visit: Payer: Self-pay

## 2018-07-30 VITALS — Ht 63.0 in | Wt 120.0 lb

## 2018-07-30 DIAGNOSIS — R634 Abnormal weight loss: Secondary | ICD-10-CM | POA: Diagnosis not present

## 2018-07-30 DIAGNOSIS — R6881 Early satiety: Secondary | ICD-10-CM | POA: Diagnosis not present

## 2018-07-30 DIAGNOSIS — M2550 Pain in unspecified joint: Secondary | ICD-10-CM

## 2018-07-30 DIAGNOSIS — D649 Anemia, unspecified: Secondary | ICD-10-CM

## 2018-07-30 DIAGNOSIS — R740 Nonspecific elevation of levels of transaminase and lactic acid dehydrogenase [LDH]: Secondary | ICD-10-CM

## 2018-07-30 DIAGNOSIS — R7401 Elevation of levels of liver transaminase levels: Secondary | ICD-10-CM

## 2018-07-30 DIAGNOSIS — M791 Myalgia, unspecified site: Secondary | ICD-10-CM

## 2018-07-30 MED ORDER — OMEPRAZOLE 40 MG PO CPDR: 40.0000 mg | DELAYED_RELEASE_CAPSULE | Freq: Every day | ORAL | 1 refills | Status: AC

## 2018-07-30 NOTE — Progress Notes (Signed)
THIS ENCOUNTER IS A VIRTUAL VISIT DUE TO COVID-19 - PATIENT WAS NOT SEEN IN THE OFFICE. PATIENT HAS CONSENTED TO VIRTUAL VISIT / TELEMEDICINE VISIT. PATIENT DID NOT HAVE VIDEO CAPABILITY SO ONLY AUDIO WAS USED   Location of patient: home Location of provider: office Name of referring provider:  Persons participating: myself, patient, patient's daughter   HPI :  73 y/o [place] male who does not speak English, h/o HTN and arthritis, referred by Dr. Grant Fontana for weight loss, early satiety, anemia, elevated AST / ALT.   He has lost 30 lbs or so. He has had a poor appetite, decreased PO intake. He is trying to eat 3 meals per day with family assistance however usually only tolerating about 2 per day. He feels full easily, limits how much he can eat. Symptoms ongoing for 3 months or so. He does not have any reflux or heartburn type symptoms. No dysphagia. No abdominal pains at all. No pains after eating. He has some constipation, mild. No blood in the stools.   No known liver disease. No FH of liver disease or colon cancer. No alcohol use. No tobacco, former smoker, quit 25 years ago. He is from [place].   He has severe joint pains and difficulty moving / ambulation, since January. He reports severe eczema for which he went to [place] this past fall and got treated. However during this time developed severe joint pains which led to him being bedbound at that time and hospitalized at a [facility]. Daughter is unclear what he was diagnosed with but he got bette for a period of time. He was taking "[place] medications" per the daughter however when he lose appetite and weight upon his return to the [place], which he stopped. He has severe muscle aches / myalgias, lies in bed most of the day. He has pain in his elbows, knees, ankles. Muscles are sore and can feel numb at times. He is able to walk to the bathroom but not much mobility. He is scheduled to see a Neurologist, has not seen  Rheumatologist. Workup as outlined below. CCP (+). He is taking ibuprofen 675m BID, he has been on this regimen for a few days. No NSAID use prior to that.    Not sure if he has ever had a  prior colonoscopy or endoscopy, if he has had a prior colonoscopy he thinks it was a very long time ago in [place]   Daughter is [name] Enuol who provided translation during this encounter.  07/17/18 - Alb 3.1, AP 36, AST 124, ALT 74, T bil 0.7 06/17/18 - Alb 3.4,  AP50, AST 95, ALT 57, T bil 0.4 2017 LAEs normal  Workup thus far: Hemochromatosis genetic testing 07/24/18 - negative ESR 114 CRP 27 Ova / parasite negative Strongyloides IgG negative Hgb 10.2, MCV 79, WBC 6.6, plt 433 Hgb in 10s since February, previously normal 8-12 months ago Iron 17, iron sat 13%, ferritin 828, TIBC 126 B12 low at 220 folate 14.4 normal ANA negative CCP AB (+) 22 TSH normal Acute hep panel negative RUQ [place] AAS 07/24/18 - normal Knee xrays - OA probable joint effusions   Past Medical History:  Diagnosis Date   Arthritis    knees   Food allergy    Shellfish allergy   Hypertension      Past Surgical History:  Procedure Laterality Date   APPENDECTOMY     CATARACT EXTRACTION N/A    I&D EXTREMITY Left 08/22/2013   Procedure:  IRRIGATION AND DEBRIDEMENT EXTREMITY;  Surgeon: Tennis Must, MD;  Location: [place];  Service: Orthopedics;  Laterality: Left;  left ring finger   LUMBAR [place] SURGERY  2011   in [place]    PERCUTANEOUS PINNING Left 08/22/2013   Procedure: PERCUTANEOUS PINNING EXTREMITY;  Surgeon: Tennis Must, MD;  Location: [place];  Service: Orthopedics;  Laterality: Left;  left ring finger   TOOTH EXTRACTION     No family history on file. Social History   Tobacco Use   Smoking status: Former Smoker    Types: Cigarettes    Last attempt to quit: 04/23/2005    Years since quitting: 13.2   Smokeless tobacco: Never Used  Substance Use Topics   Alcohol use: No    Alcohol/week: 0.0  standard drinks   Drug use: No   Current Outpatient Medications  Medication Sig Dispense Refill   cetirizine (ZYRTEC) 10 MG tablet Take 1 tablet (10 mg total) by mouth daily. 30 tablet 2   doxepin (SINEQUAN) 25 MG capsule Take 1 capsule (25 mg total) by mouth at bedtime. 30 capsule 2   fluticasone (FLONASE) 50 MCG/ACT nasal spray Place 1 spray into both nostrils 2 (two) times daily. 16 g 6   ibuprofen (ADVIL,MOTRIN) 600 MG tablet Take 1 tablet (600 mg total) by mouth every 8 (eight) hours as needed. Take with food or milk 30 tablet 0   Multiple Vitamins-Minerals (MULTIVITAMIN WITH MINERALS) tablet Take 1 tablet by mouth daily.     Triamcinolone Acetonide (TRIAMCINOLONE 0.1 % CREAM : EUCERIN) CREA Apply 1 application topically 2 (two) times daily as needed. 1 each 0   vitamin B-12 (CYANOCOBALAMIN) 500 MCG tablet Take 500 mcg by mouth daily.     furosemide (LASIX) 20 MG tablet Take 1 tablet (20 mg total) by mouth daily. (Patient not taking: Reported on 07/30/2018) 14 tablet 0   No current facility-administered medications for this visit.    No Known Allergies   Review of Systems: All systems reviewed and negative except where noted in HPI.    Dg Wrist Complete Left  Result Date: 07/24/2018 CLINICAL DATA:  Pain and swelling.  Remote injury.  No new injury. EXAM: LEFT WRIST - COMPLETE 3+ VIEW COMPARISON:  None. FINDINGS: There is no evidence of fracture or dislocation. There is no evidence of arthropathy or other focal bone abnormality. Soft tissues are unremarkable. IMPRESSION: Negative. Electronically Signed   By: Rolm Baptise M.D.   On: 07/24/2018 12:55   Dg Knee Complete 4 Views Left  Result Date: 07/24/2018 CLINICAL DATA:  Pain and swelling. EXAM: LEFT KNEE - COMPLETE 4+ VIEW COMPARISON:  None. FINDINGS: Small knee joint effusion is suspected. Mild medial compartment joint space narrowing with very small marginal osteophytes. Chronic calcification of the patellar tendon.  Osteoarthritis of the patellofemoral joint with joint space narrowing and marginal osteophytes. 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 Probable small joint effusion. Electronically Signed   By: Nelson Chimes M.D.   On: 07/24/2018 12:50   Dg Knee Complete 4 Views Right  Result Date: 07/24/2018 CLINICAL DATA:  Pain and swelling.  Old injury. EXAM: RIGHT KNEE - COMPLETE 4+ VIEW COMPARISON:  None. FINDINGS: Mild medial compartment and patellofemoral compartment joint space narrowing with medial compartment marginal osteophytes. Chronic calcification of the patellar tendon. Probable small joint effusion. IMPRESSION: Medial compartment more than patellofemoral compartment osteoarthritis. Probable small joint effusion. Electronically Signed   By: Nelson Chimes M.D.   On: 07/24/2018 12:51  Dg Abd 2 Views  Result Date: 07/24/2018 CLINICAL DATA:  Pain and swelling, weight loss EXAM: ABDOMEN - 2 VIEW COMPARISON:  None. FINDINGS: The bowel gas pattern is normal. There is no evidence of free air. No radio-opaque calculi or other significant radiographic abnormality is seen. IMPRESSION: Negative. Electronically Signed   By: Kathreen Devoid   On: 07/24/2018 12:51    Physical Exam: Ht '5\' 3"'  (1.6 m)    Wt 120 lb (54.4 kg) Comment: patient's daughter, [name] Enoul, provided over the phone   BMI 21.26 kg/m    ASSESSMENT AND PLAN:  72 y/o male referred for a new patient visit regarding the following:  Anemia / weight loss / early satiety - he has microcytic anemia with low iron levels but markedly elevated ferritin. I suspect he has iron deficiency but ferritin is elevated as an acute phase reactant from his inflammatory process. He has lost considerable weight and with early satiety. EGD and colonoscopy would normally be recommended first line to evaluate this anemia, however due to his poor PO intake and his limited mobility, his daughter does not think he can tolerate a bowel  prep right now for a colonoscopy. Will proceed with EGD initially. I have discussed risks / benefits of this with them, they wanted to proceed, they deny any cardiopulmonary symptoms. If EGD is negative will bring back for colonoscopy when he can tolerate a prep, and consider imaging in the interim. We discussed the timing of the EGD. While we are not doing elective procedures given the COVID-19 outbreak, given his progressive weight loss and anemia, they wanted to proceed to have this done in the near future. Will call back to schedule this. In the interim will try some omeprazole 37m daily in case he has gastritis or PUD causing his symptoms. I recommend he avoid NSAIDs right now given his upper tract symptoms, and use low dose Tylenol. He agreed with the plan.   Transaminitis / joint pains / myalgias - isolated AST > ALT elevation in the setting of elevated inflammatory markers, myalgias / arthralgias. Hep B/C negative. He does not drink any alcohol. Given his myalgias and inflammatory markers, I'm concerned the AST / ALT elevation could be related to muscle breakdown secondary underlying myositis or myopathy. I would check creatine kinase level now to confirm that. If that is elevated, no further liver workup would be needed. If the CK is normal however, will pursue additional workup with serologies and [place]. I will otherwise refer him to Rheumatology given his debilitated state and progressive joint pains to get their opinion. They agreed.   SCarolina Cellar MD [facility]  CC: SRutherford Guys MD

## 2018-07-30 NOTE — Progress Notes (Signed)
Prescreened Through his daughter, Ileene Musa

## 2018-07-30 NOTE — Patient Instructions (Addendum)
If you are age 75 or older, your body mass index should be between 23-30. Your Body mass index is 21.26 kg/m. If this is out of the aforementioned range listed, please consider follow up with your Primary Care Provider.  If you are age 24 or younger, your body mass index should be between 19-25. Your Body mass index is 21.26 kg/m. If this is out of the aformentioned range listed, please consider follow up with your Primary Care Provider.   We are Referring you to [facility] (Dr. Shawnee Knapp office).  They will contact you to schedule an appointment sometime in the next week or two. In case you don't hear from them, their number is [phone].  We have sent the following medications to your pharmacy for you to pick up at your convenience: Omeprazole 40mg :  Take once daily  Discontinue using Ibuprofen.  Use Tylenol as needed.  Please go to the lab in the basement of our building located at [address] for lab work as soon as possible.  Come to the 3rd floor to sign the consent for your endoscopy on 08-07-2018.  You have been scheduled for an endoscopy. Please follow written instructions given to you at your visit today. If you use inhalers (even only as needed), please bring them with you on the day of your procedure. Your physician has requested that you go to www.startemmi.com and enter the access code given to you at your visit today. This web site gives a general overview about your procedure. However, you should still follow specific instructions given to you by our office regarding your preparation for the procedure.  Thank you for entrusting me with your care and for choosing [facility], Dr. Ileene Patrick

## 2018-07-31 ENCOUNTER — Telehealth: Payer: Self-pay

## 2018-07-31 ENCOUNTER — Other Ambulatory Visit (INDEPENDENT_AMBULATORY_CARE_PROVIDER_SITE_OTHER): Payer: Medicare Other

## 2018-07-31 DIAGNOSIS — R740 Nonspecific elevation of levels of transaminase and lactic acid dehydrogenase [LDH]: Secondary | ICD-10-CM | POA: Diagnosis not present

## 2018-07-31 DIAGNOSIS — R7401 Elevation of levels of liver transaminase levels: Secondary | ICD-10-CM

## 2018-07-31 LAB — CK: Total CK: 1957 U/L — ABNORMAL HIGH (ref 7–232)

## 2018-07-31 NOTE — Telephone Encounter (Signed)
Pt has an appt with Dr. Dierdre Forth at [facility] on 08-21-18 at 1:30pm.  They were offered an earlier appt day but were unable to make that appt

## 2018-07-31 NOTE — Telephone Encounter (Signed)
Great - thanks

## 2018-07-31 NOTE — Progress Notes (Signed)
Dr Adela Lank, Thank you for seeing him so quickly and making referral to rheumatology.  Cordially, [employer]

## 2018-08-06 ENCOUNTER — Telehealth: Payer: Self-pay | Admitting: *Deleted

## 2018-08-06 ENCOUNTER — Ambulatory Visit: Payer: Self-pay | Admitting: *Deleted

## 2018-08-06 NOTE — Telephone Encounter (Signed)
Attempted to reach pt with Covid screening questions.  LMOM and will try again later

## 2018-08-06 NOTE — Telephone Encounter (Signed)
Spoke with daughter- she states they believe her father had a fever within the last few days; they do not have a thermometer, but he felt "warm to the touch and had chills as well."  He "took some medicine" and the fever went away.  She also states that he has had sneezing and coughing with drainage for quite a while, but it has increased in the past few days.  No known exposure to anyone with the COVID 19 per daughter.  Dr. Adela Lank- please advise  Thanks, Baxter Hire

## 2018-08-06 NOTE — Telephone Encounter (Signed)
See previous note.  Spoke with Dr. Adela Lank who wishes patient to be rescheduled out past two weeks and pt is to get in touch with PCP regarding symptoms.   I spoke with daughter and she is agreeable to above.  Rescheduled for May 6th at 830.

## 2018-08-06 NOTE — Telephone Encounter (Signed)
Patient is reporting new symptoms: feverish, chills, body aches- lasted 1 day, cough and sneezing- 3 days Patient reports his sinus symptoms have not improved with current medications. Call to office to review current symptoms and see if patient's appointment can be moved up. (Patient also reports he is still having knee pain)  Reason for Disposition . [1] Nasal discharge AND [2] present > 10 days    Patient is still having allergy symptoms- he complains of sinus drainage, headache, sore throat and now has slight cough and sneezing. Medications given at last appointment are not helping.  Answer Assessment - Initial Assessment Questions 1. ONSET: "When did the cough begin?"      Soft, weak cough 2. SEVERITY: "How bad is the cough today?"      Random- more at night than during day 3. RESPIRATORY DISTRESS: "Describe your breathing."      Normal breathing pattern- no discomfort 4. FEVER: "Do you have a fever?" If so, ask: "What is your temperature, how was it measured, and when did it start?"     feverish 1 day- ( 2 days ago- after bath) 5. HEMOPTYSIS: "Are you coughing up any blood?" If so ask: "How much?" (flecks, streaks, tablespoons, etc.)     Patient is coughing up white and yellow mucus- occasional- no blood 6. TREATMENT: "What have you done so far to treat the cough?" (e.g., meds, fluids, humidifier)     Patient is not using any remedy now- clearing throat constantly- some sore throat 7. CARDIAC HISTORY: "Do you have any history of heart disease?" (e.g., heart attack, congestive heart failure)      no 8. LUNG HISTORY: "Do you have any history of lung disease?"  (e.g., pulmonary embolus, asthma, emphysema)     no 9. PE RISK FACTORS: "Do you have a history of blood clots?" (or: recent major surgery, recent prolonged travel, bedridden)     no 10. OTHER SYMPTOMS: "Do you have any other symptoms? (e.g., runny nose, wheezing, chest pain)       Runny nose-sinus drainage, headache 11.  PREGNANCY: "Is there any chance you are pregnant?" "When was your last menstrual period?"       n/a 12. TRAVEL: "Have you traveled out of the country in the last month?" (e.g., travel history, exposures)       No travel- no known exposure  Protocols used: COUGH - ACUTE NON-PRODUCTIVE-A-AH

## 2018-08-07 ENCOUNTER — Other Ambulatory Visit: Payer: Self-pay

## 2018-08-07 ENCOUNTER — Telehealth (INDEPENDENT_AMBULATORY_CARE_PROVIDER_SITE_OTHER): Payer: Medicare Other | Admitting: Family Medicine

## 2018-08-07 ENCOUNTER — Encounter: Payer: Self-pay | Admitting: Gastroenterology

## 2018-08-07 DIAGNOSIS — J0110 Acute frontal sinusitis, unspecified: Secondary | ICD-10-CM | POA: Diagnosis not present

## 2018-08-07 MED ORDER — AMOXICILLIN 500 MG PO CAPS: 500.0000 mg | ORAL_CAPSULE | Freq: Three times a day (TID) | ORAL | 0 refills | Status: DC

## 2018-08-07 NOTE — Progress Notes (Signed)
Virtual Visit via telephone Note  I connected with patient on 08/07/18 at 446pm by telephone and verified that I am speaking with the correct person using two identifiers. Nathan Bruce is currently located at home and family is currently with her during visit. The provider, Myles LippsIrma M Santiago, MD is located in their office at time of visit.  I discussed the limitations, risks, security and privacy concerns of performing an evaluation and management service by telephone and the availability of in person appointments. I also discussed with the patient that there may be a patient responsible charge related to this service. The patient expressed understanding and agreed to proceed.  Telephone visit today for fevers, chills  HPI  Daughter serves has interpreter, main historian He was scheduled for endoscopy today but rescheduled due to sx that started 3 days ago 3 days ago had subjective fever, chills Having sneezing, cough, headaches, nasal congestion x 2 weeks Fever and chills was only one day  Headache in between his eyes, not worse when he bends over, better with nasal spray, no sinus pain Today better, no coughing or sneezing No chest congestion, no SOB Has been using flonase Changed ibu to apap after GI recommendation Has been doing better Has not heard from rheum yet appetite unchanged  ?  Fall Risk  08/07/2018 07/24/2018 07/16/2018 06/17/2018 11/18/2017  Falls in the past year? 0 0 0 1 No  Number falls in past yr: 0 0 - - -  Injury with Fall? 0 0 - 0 -  Follow up - - Falls evaluation completed - -     [facility] 08/07/2018 07/24/2018 07/16/2018  Decreased Interest 3 0 0  Down, Depressed, Hopeless 3 0 0  PHQ - 2 Score 6 0 0  Altered sleeping 3 - -  Tired, decreased energy 2 - -  Change in appetite 3 - -  Feeling bad or failure about yourself  0 - -  Trouble concentrating 1 - -  Moving slowly or fidgety/restless 0 - -  Suicidal thoughts 0 - -  PHQ-9 Score 15 - -   Difficult doing work/chores Extremely dIfficult - -    No Known Allergies  Prior to Admission medications   Medication Sig Start Date End Date Taking? Authorizing Provider  acetaminophen (TYLENOL) 500 MG tablet Take 500 mg by mouth every 6 (six) hours as needed.   Yes [provider]  doxepin (SINEQUAN) 25 MG capsule Take 1 capsule (25 mg total) by mouth at bedtime. 07/24/18  Yes Myles LippsSantiago, Tyshawn Keel M, MD  fluticasone (FLONASE) 50 MCG/ACT nasal spray Place 1 spray into both nostrils 2 (two) times daily. 07/24/18  Yes Myles LippsSantiago, Verl Whitmore M, MD  furosemide (LASIX) 20 MG tablet Take 1 tablet (20 mg total) by mouth daily. 06/17/18  Yes Myles LippsSantiago, Taleyah Hillman M, MD  Multiple Vitamins-Minerals (MULTIVITAMIN WITH MINERALS) tablet Take 1 tablet by mouth daily.   Yes [provider]  omeprazole (PRILOSEC) 40 MG capsule Take 1 capsule (40 mg total) by mouth daily. 07/30/18  Yes Armbruster, Willaim RayasSteven P, MD  vitamin B-12 (CYANOCOBALAMIN) 500 MCG tablet Take 500 mcg by mouth daily.   Yes [provider]  cetirizine (ZYRTEC) 10 MG tablet Take 1 tablet (10 mg total) by mouth daily. Patient not taking: Reported on 08/07/2018 06/17/18   Myles LippsSantiago, Mandel Seiden M, MD  Triamcinolone Acetonide (TRIAMCINOLONE 0.1 % CREAM : EUCERIN) CREA Apply 1 application topically 2 (two) times daily as needed. Patient not taking: Reported on 08/07/2018 06/26/18  Myles Lipps, MD    Past Medical History:  Diagnosis Date  . Arthritis    knees  . Food allergy    Shellfish allergy  . Hypertension     Past Surgical History:  Procedure Laterality Date  . APPENDECTOMY    . CATARACT EXTRACTION N/A   . I&D EXTREMITY Left 08/22/2013   Procedure: IRRIGATION AND DEBRIDEMENT EXTREMITY;  Surgeon: Tami Ribas, MD;  Location: [facility] OR;  Service: Orthopedics;  Laterality: Left;  left ring finger  . LUMBAR DISC SURGERY  2011   in [place]   . PERCUTANEOUS PINNING Left 08/22/2013   Procedure: PERCUTANEOUS PINNING EXTREMITY;  Surgeon: Tami Ribas, MD;  Location: [facility] OR;  Service: Orthopedics;  Laterality: Left;  left ring finger  . TOOTH EXTRACTION      Social History   Tobacco Use  . Smoking status: Former Smoker    Types: Cigarettes    Last attempt to quit: 04/23/2005    Years since quitting: 13.2  . Smokeless tobacco: Never Used  Substance Use Topics  . Alcohol use: No    Alcohol/week: 0.0 standard drinks    No family history on file.  ROS Per hpi  Objective  Vitals as reported by the patient: none  There were no vitals filed for this visit.  ASSESSMENT and PLAN  1. Acute non-recurrent frontal sinusitis Discussed supportive measures, new meds r/se/b and RTC precautions.  Other orders - amoxicillin (AMOXIL) 500 MG capsule; Take 1 capsule (500 mg total) by mouth 3 (three) times daily.  FOLLOW-UP: as scheduled   The above assessment and management plan was discussed with the patient. The patient verbalized understanding of and has agreed to the management plan. Patient is aware to call the clinic if symptoms persist or worsen. Patient is aware when to return to the clinic for a follow-up visit. Patient educated on when it is appropriate to go to the emergency department.    I provided 18 minutes of non-face-to-face time during this encounter.  Myles Lipps, MD Primary Care at [facility] [address] [place], [place] [address] Ph.  [phone] Fax [phone]

## 2018-08-07 NOTE — Progress Notes (Signed)
Chief Complaint :  Pt daughter state that pt had a fever 3 days ago and shaking. She also states that she was not able to take temperature  due to not having a thermometer

## 2018-08-14 ENCOUNTER — Ambulatory Visit: Payer: Medicare Other | Admitting: Family Medicine

## 2018-08-17 ENCOUNTER — Inpatient Hospital Stay (HOSPITAL_COMMUNITY)
Admission: EM | Admit: 2018-08-17 | Discharge: 2018-08-22 | DRG: 064 | Disposition: A | Discharge status: Home Health | Payer: Medicare Other | Attending: Internal Medicine | Admitting: Internal Medicine

## 2018-08-17 ENCOUNTER — Encounter (HOSPITAL_COMMUNITY): Payer: Self-pay | Admitting: Emergency Medicine

## 2018-08-17 ENCOUNTER — Telehealth: Payer: Self-pay | Admitting: Family Medicine

## 2018-08-17 ENCOUNTER — Emergency Department (HOSPITAL_COMMUNITY): Payer: Medicare Other

## 2018-08-17 ENCOUNTER — Other Ambulatory Visit: Payer: Self-pay

## 2018-08-17 DIAGNOSIS — R29713 NIHSS score 13: Secondary | ICD-10-CM | POA: Diagnosis present

## 2018-08-17 DIAGNOSIS — I63441 Cerebral infarction due to embolism of right cerebellar artery: Principal | ICD-10-CM | POA: Diagnosis present

## 2018-08-17 DIAGNOSIS — Z7951 Long term (current) use of inhaled steroids: Secondary | ICD-10-CM

## 2018-08-17 DIAGNOSIS — Z6821 Body mass index (BMI) 21.0-21.9, adult: Secondary | ICD-10-CM

## 2018-08-17 DIAGNOSIS — E871 Hypo-osmolality and hyponatremia: Secondary | ICD-10-CM | POA: Diagnosis not present

## 2018-08-17 DIAGNOSIS — J069 Acute upper respiratory infection, unspecified: Secondary | ICD-10-CM

## 2018-08-17 DIAGNOSIS — E43 Unspecified severe protein-calorie malnutrition: Secondary | ICD-10-CM | POA: Diagnosis present

## 2018-08-17 DIAGNOSIS — Z8673 Personal history of transient ischemic attack (TIA), and cerebral infarction without residual deficits: Secondary | ICD-10-CM

## 2018-08-17 DIAGNOSIS — G3184 Mild cognitive impairment, so stated: Secondary | ICD-10-CM | POA: Diagnosis present

## 2018-08-17 DIAGNOSIS — E785 Hyperlipidemia, unspecified: Secondary | ICD-10-CM | POA: Diagnosis present

## 2018-08-17 DIAGNOSIS — I480 Paroxysmal atrial fibrillation: Secondary | ICD-10-CM | POA: Diagnosis present

## 2018-08-17 DIAGNOSIS — Z8249 Family history of ischemic heart disease and other diseases of the circulatory system: Secondary | ICD-10-CM

## 2018-08-17 DIAGNOSIS — Z91013 Allergy to seafood: Secondary | ICD-10-CM

## 2018-08-17 DIAGNOSIS — Z20828 Contact with and (suspected) exposure to other viral communicable diseases: Secondary | ICD-10-CM | POA: Diagnosis present

## 2018-08-17 DIAGNOSIS — E440 Moderate protein-calorie malnutrition: Secondary | ICD-10-CM

## 2018-08-17 DIAGNOSIS — D509 Iron deficiency anemia, unspecified: Secondary | ICD-10-CM | POA: Diagnosis present

## 2018-08-17 DIAGNOSIS — L309 Dermatitis, unspecified: Secondary | ICD-10-CM | POA: Diagnosis present

## 2018-08-17 DIAGNOSIS — G8194 Hemiplegia, unspecified affecting left nondominant side: Secondary | ICD-10-CM | POA: Diagnosis present

## 2018-08-17 DIAGNOSIS — K08409 Partial loss of teeth, unspecified cause, unspecified class: Secondary | ICD-10-CM | POA: Diagnosis present

## 2018-08-17 DIAGNOSIS — D649 Anemia, unspecified: Secondary | ICD-10-CM | POA: Diagnosis present

## 2018-08-17 DIAGNOSIS — I6340 Cerebral infarction due to embolism of unspecified cerebral artery: Secondary | ICD-10-CM | POA: Diagnosis present

## 2018-08-17 DIAGNOSIS — Z9849 Cataract extraction status, unspecified eye: Secondary | ICD-10-CM

## 2018-08-17 DIAGNOSIS — Z87891 Personal history of nicotine dependence: Secondary | ICD-10-CM

## 2018-08-17 DIAGNOSIS — M7989 Other specified soft tissue disorders: Secondary | ICD-10-CM | POA: Diagnosis present

## 2018-08-17 DIAGNOSIS — I10 Essential (primary) hypertension: Secondary | ICD-10-CM | POA: Diagnosis present

## 2018-08-17 DIAGNOSIS — I639 Cerebral infarction, unspecified: Secondary | ICD-10-CM | POA: Diagnosis present

## 2018-08-17 DIAGNOSIS — Z79899 Other long term (current) drug therapy: Secondary | ICD-10-CM

## 2018-08-17 DIAGNOSIS — M170 Bilateral primary osteoarthritis of knee: Secondary | ICD-10-CM | POA: Diagnosis present

## 2018-08-17 DIAGNOSIS — J189 Pneumonia, unspecified organism: Secondary | ICD-10-CM | POA: Diagnosis present

## 2018-08-17 HISTORY — DX: Pure hypercholesterolemia, unspecified: E78.00

## 2018-08-17 LAB — COMPREHENSIVE METABOLIC PANEL
ALT: 88 U/L — ABNORMAL HIGH (ref 0–44)
AST: 132 U/L — ABNORMAL HIGH (ref 15–41)
Albumin: 2 g/dL — ABNORMAL LOW (ref 3.5–5.0)
Alkaline Phosphatase: 47 U/L (ref 38–126)
Anion gap: 5 (ref 5–15)
BUN: 11 mg/dL (ref 8–23)
CO2: 24 mmol/L (ref 22–32)
Calcium: 7.9 mg/dL — ABNORMAL LOW (ref 8.9–10.3)
Chloride: 99 mmol/L (ref 98–111)
Creatinine, Ser: 0.7 mg/dL (ref 0.61–1.24)
GFR calc Af Amer: >60 mL/min (ref >60)
GFR calc non Af Amer: >60 mL/min (ref >60)
Glucose, Bld: 201 mg/dL — ABNORMAL HIGH (ref 70–99)
Potassium: 3.9 mmol/L (ref 3.5–5.1)
Sodium: 128 mmol/L — ABNORMAL LOW (ref 135–145)
Total Bilirubin: 0.6 mg/dL (ref 0.3–1.2)
Total Protein: 5.9 g/dL — ABNORMAL LOW (ref 6.5–8.1)

## 2018-08-17 LAB — CBC
HCT: 27.9 % — ABNORMAL LOW (ref 39.0–52.0)
Hemoglobin: 9 g/dL — ABNORMAL LOW (ref 13.0–17.0)
MCH: 23.3 pg — ABNORMAL LOW (ref 26.0–34.0)
MCHC: 32.3 g/dL (ref 30.0–36.0)
MCV: 72.3 fL — ABNORMAL LOW (ref 80.0–100.0)
Platelets: 430 10*3/uL — ABNORMAL HIGH (ref 150–400)
RBC: 3.86 MIL/uL — ABNORMAL LOW (ref 4.22–5.81)
RDW: 17.7 % — ABNORMAL HIGH (ref 11.5–15.5)
WBC: 9.8 10*3/uL (ref 4.0–10.5)
nRBC: 0 % (ref 0.0–0.2)

## 2018-08-17 MED ORDER — SODIUM CHLORIDE 0.9 % IV SOLN
500.0000 mg | INTRAVENOUS | Status: DC
  Administered 2018-08-17: 500 mg via INTRAVENOUS
  Filled 2018-08-17: qty 500

## 2018-08-17 MED ORDER — SODIUM CHLORIDE 0.9 % IV SOLN
2.0000 g | INTRAVENOUS | Status: DC
  Administered 2018-08-17: 2 g via INTRAVENOUS
  Filled 2018-08-17: qty 20

## 2018-08-17 NOTE — Telephone Encounter (Signed)
See other note

## 2018-08-17 NOTE — Telephone Encounter (Signed)
Call from answering service at 2056. Patients dtr called with concern of him having trouble swallowing, but also having trouble moving his left arm and left leg. Nurse advised pt/family to call 911 call for possible code stroke. Agree with plan.  After I spoke to nurse, she planned on calling back patient to make sure they contacted 911.

## 2018-08-17 NOTE — ED Provider Notes (Signed)
[facility] EMERGENCY DEPARTMENT Provider Note   CSN: [id number] Arrival date & time: 08/17/18  2208    History   Chief Complaint Chief Complaint  Patient presents with   Altered Mental Status   Left Sided Weakness    HPI Nathan Bruce is a 73 y.o. male.     Patient presents via ems, with progressive general weakness, and especially left sided weakness in past 3 days, along with confusion. In past several weeks, pt with decreased appetite, poor po intake, and c/o myalgias and arthralgias. Limited historian, pt speaks [place] and is confused - level 5 caveat. Above history is from patient daughter. No report of fever. +unspecified wt loss. No report of trauma or fall. Went to [place] for treatment eczema in January - unclear what meds were given.   The history is provided by the patient, the EMS personnel and a relative. The history is limited by the condition of the patient.  Altered Mental Status  Presenting symptoms: confusion   Associated symptoms: weakness   Associated symptoms: no fever     Past Medical History:  Diagnosis Date   Arthritis    knees   Food allergy    Shellfish allergy   Hypercholesterolemia    Hypertension     There are no active problems to display for this patient.   Past Surgical History:  Procedure Laterality Date   APPENDECTOMY     CATARACT EXTRACTION N/A    I&D EXTREMITY Left 08/22/2013   Procedure: IRRIGATION AND DEBRIDEMENT EXTREMITY;  Surgeon: Tami Ribas, MD;  Location: [facility] OR;  Service: Orthopedics;  Laterality: Left;  left ring finger   LUMBAR DISC SURGERY  2011   in [place]    PERCUTANEOUS PINNING Left 08/22/2013   Procedure: PERCUTANEOUS PINNING EXTREMITY;  Surgeon: Tami Ribas, MD;  Location[facility] OR;  Service: Orthopedics;  Laterality: Left;  left ring finger   TOOTH EXTRACTION          Home Medications    Prior to Admission medications   Medication Sig Start Date End Date Taking? Authorizing  Provider  acetaminophen (TYLENOL) 500 MG tablet Take 500 mg by mouth every 6 (six) hours as needed.    [provider]  amoxicillin (AMOXIL) 500 MG capsule Take 1 capsule (500 mg total) by mouth 3 (three) times daily. 08/07/18   Myles Lipps, MD  cetirizine (ZYRTEC) 10 MG tablet Take 1 tablet (10 mg total) by mouth daily. Patient not taking: Reported on 08/07/2018 06/17/18   Myles Lipps, MD  doxepin [facility]) 25 MG capsule Take 1 capsule (25 mg total) by mouth at bedtime. 07/24/18   Myles Lipps, MD  fluticasone (FLONASE) 50 MCG/ACT nasal spray Place 1 spray into both nostrils 2 (two) times daily. 07/24/18   Myles Lipps, MD  furosemide (LASIX) 20 MG tablet Take 1 tablet (20 mg total) by mouth daily. 06/17/18   Myles Lipps, MD  Multiple Vitamins-Minerals (MULTIVITAMIN WITH MINERALS) tablet Take 1 tablet by mouth daily.    [provider]  omeprazole (PRILOSEC) 40 MG capsule Take 1 capsule (40 mg total) by mouth daily. 07/30/18   Armbruster, Willaim Rayas, MD  Triamcinolone Acetonide (TRIAMCINOLONE 0.1 % CREAM : EUCERIN) CREA Apply 1 application topically 2 (two) times daily as needed. Patient not taking: Reported on 08/07/2018 06/26/18   Myles Lipps, MD  vitamin B-12 (CYANOCOBALAMIN) 500 MCG tablet Take 500 mcg by mouth daily.    [provider]  Family History History reviewed. No pertinent family history.  Social History Social History   Tobacco Use   Smoking status: Former Smoker    Types: Cigarettes    Last attempt to quit: 04/23/2005    Years since quitting: 13.3   Smokeless tobacco: Never Used  Substance Use Topics   Alcohol use: No    Alcohol/week: 0.0 standard drinks   Drug use: No     Allergies   Patient has no known allergies.   Review of Systems Review of Systems  Unable to perform ROS: Mental status change  Constitutional: Positive for appetite change. Negative for fever.  Neurological: Positive for weakness.    Psychiatric/Behavioral: Positive for confusion.  level 5 caveat, confusion, language barrier.    Physical Exam Updated Vital Signs Temp 97.8 F (36.6 C) (Oral)    Ht 1.6 m ( )    Wt 54.4 kg    BMI 21.26 kg/m   Physical Exam Vitals signs and nursing note reviewed.  Constitutional:      Appearance: He is well-developed.     Comments: Thin, frail appearing.  HENT:     Head: Atraumatic.     Nose: Nose normal.     Mouth/Throat:     Mouth: Mucous membranes are moist.     Pharynx: Oropharynx is clear.  Eyes:     General: No scleral icterus.    Conjunctiva/sclera: Conjunctivae normal.     Pupils: Pupils are equal, round, and reactive to light.  Neck:     Musculoskeletal: Normal range of motion and neck supple. No neck rigidity.     Vascular: No carotid bruit.     Trachea: No tracheal deviation.  Cardiovascular:     Rate and Rhythm: Normal rate and regular rhythm.     Pulses: Normal pulses.     Heart sounds: Normal heart sounds. No murmur. No friction rub. No gallop.   Pulmonary:     Effort: Pulmonary effort is normal. No accessory muscle usage or respiratory distress.     Breath sounds: Normal breath sounds.  Abdominal:     General: Bowel sounds are normal. There is no distension.     Palpations: Abdomen is soft.     Tenderness: There is no abdominal tenderness. There is no guarding.  Genitourinary:    Comments: No cva tenderness. Musculoskeletal:     Comments: Left arm and leg swelling as compared to right. CTLS spine, non tender, aligned, no step off.   Skin:    General: Skin is warm and dry.     Findings: No rash.  Neurological:     Comments: Alert appearing. Purposeful movement right arm/leg with good strength. No spontaneous movement left arm/leg ?left weakness.   Psychiatric:        Mood and Affect: Mood normal.      ED Treatments / Results  Labs (all labs ordered are listed, but only abnormal results are displayed) Results for orders placed or performed  during the hospital encounter of 08/17/18  CBC  Result Value Ref Range   WBC 9.8 4.0 - 10.5 K/uL   RBC 3.86 (L) 4.22 - 5.81 MIL/uL   Hemoglobin 9.0 (L) 13.0 - 17.0 g/dL   HCT 16.1 (L) 09.6 - 04.5 %   MCV 72.3 (L) 80.0 - 100.0 fL   MCH 23.3 (L) 26.0 - 34.0 pg   MCHC 32.3 30.0 - 36.0 g/dL   RDW 40.9 (H) 81.1 - 91.4 %   Platelets 430 (H) 150 - 400 K/uL  nRBC 0.0 0.0 - 0.2 %  Comprehensive metabolic panel  Result Value Ref Range   Sodium 128 (L) 135 - 145 mmol/L   Potassium 3.9 3.5 - 5.1 mmol/L   Chloride 99 98 - 111 mmol/L   CO2 24 22 - 32 mmol/L   Glucose, Bld 201 (H) 70 - 99 mg/dL   BUN 11 8 - 23 mg/dL   Creatinine, Ser 2.950.70 0.61 - 1.24 mg/dL   Calcium 7.9 (L) 8.9 - 10.3 mg/dL   Total Protein 5.9 (L) 6.5 - 8.1 g/dL   Albumin 2.0 (L) 3.5 - 5.0 g/dL   AST 621132 (H) 15 - 41 U/L   ALT 88 (H) 0 - 44 U/L   Alkaline Phosphatase 47 38 - 126 U/L   Total Bilirubin 0.6 0.3 - 1.2 mg/dL   GFR calc non Af Amer >60 >60 mL/min   GFR calc Af Amer >60 >60 mL/min   Anion gap 5 5 - 15   Dg Wrist Complete Left  Result Date: 07/24/2018 CLINICAL DATA:  Pain and swelling.  Remote injury.  No new injury. EXAM: LEFT WRIST - COMPLETE 3+ VIEW COMPARISON:  None. FINDINGS: There is no evidence of fracture or dislocation. There is no evidence of arthropathy or other focal bone abnormality. Soft tissues are unremarkable. IMPRESSION: Negative. Electronically Signed   By: Charlett NoseKevin  Dover M.D.   On: 07/24/2018 12:55   Ct Head Wo Contrast  Result Date: 08/17/2018 CLINICAL DATA:  Left-sided weakness with confusion EXAM: CT HEAD WITHOUT CONTRAST TECHNIQUE: Contiguous axial images were obtained from the base of the skull through the vertex without intravenous contrast. COMPARISON:  None. FINDINGS: Brain: No hemorrhage or intracranial mass. Encephalomalacia involving the left greater than right frontal lobe. Hypodensity within the right parasagittal frontal parietal vertex. Age indeterminate hypodensity within the left  posterior parietal lobe. Mild atrophy. Small vessel ischemic changes of the white matter. Mildly prominent ventricles felt secondary to atrophy. Vascular: No hyperdense vessels.  Carotid vascular calcification Skull: No fracture Sinuses/Orbits: No acute finding. Other: None IMPRESSION: 1. 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 No hemorrhage. Age indeterminate cortical infarct within the left posterior parietal lobe. 2. Atrophy and small vessel ischemic changes of the white matter. Encephalomalacia within the left greater than right frontal lobes. Electronically Signed   By: Jasmine PangKim  Fujinaga M.D.   On: 08/17/2018 22:54   Dg Chest Port 1 View  Result Date: 08/17/2018 CLINICAL DATA:  Weakness EXAM: PORTABLE CHEST 1 VIEW COMPARISON:  06/17/2018 FINDINGS: Consolidation in the left lower lobe. No large effusion. Borderline cardiomegaly with aortic atherosclerosis. No pneumothorax. IMPRESSION: Left lower lobe consolidation concerning for a pneumonia. Radiographic follow-up to resolution is advised. Electronically Signed   By: Jasmine PangKim  Fujinaga M.D.   On: 08/17/2018 23:02   Dg Knee Complete 4 Views Left  Result Date: 07/24/2018 CLINICAL DATA:  Pain and swelling. EXAM: LEFT KNEE - COMPLETE 4+ VIEW COMPARISON:  None. FINDINGS: Small knee joint effusion is suspected. Mild medial compartment joint space narrowing with very small marginal osteophytes. Chronic calcification of the patellar tendon. Osteoarthritis of the patellofemoral joint with joint space narrowing and marginal osteophytes. 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 Probable small joint effusion. Electronically Signed   By: Paulina FusiMark  Shogry M.D.   On: 07/24/2018 12:50   Dg Knee Complete 4 Views Right  Result Date: 07/24/2018 CLINICAL DATA:  Pain and swelling.  Old injury. EXAM: RIGHT KNEE -  COMPLETE 4+ VIEW COMPARISON:  None.  FINDINGS: Mild medial compartment and patellofemoral compartment joint space narrowing with medial compartment marginal osteophytes. Chronic calcification of the patellar tendon. Probable small joint effusion. IMPRESSION: Medial compartment more than patellofemoral compartment osteoarthritis. Probable small joint effusion. Electronically Signed   By: Paulina Fusi M.D.   On: 07/24/2018 12:51   Dg Abd 2 Views  Result Date: 07/24/2018 CLINICAL DATA:  Pain and swelling, weight loss EXAM: ABDOMEN - 2 VIEW COMPARISON:  None. FINDINGS: The bowel gas pattern is normal. There is no evidence of free air. No radio-opaque calculi or other significant radiographic abnormality is seen. IMPRESSION: Negative. Electronically Signed   By: Elige Ko   On: 07/24/2018 12:51    EKG EKG Interpretation  Date/Time:  Sunday August 17 2018 22:28:09 EDT Ventricular Rate:  76 PR Interval:    QRS Duration: 88 QT Interval:  378 QTC Calculation: 425 R Axis:   76 Text Interpretation:  Sinus rhythm Nonspecific T wave abnormality No previous tracing Confirmed by Cathren Laine ([id number]) on 08/17/2018 10:32:51 PM   Radiology Ct Head Wo Contrast  Result Date: 08/17/2018 CLINICAL DATA:  Left-sided weakness with confusion EXAM: CT HEAD WITHOUT CONTRAST TECHNIQUE: Contiguous axial images were obtained from the base of the skull through the vertex without intravenous contrast. COMPARISON:  None. FINDINGS: Brain: No hemorrhage or intracranial mass. Encephalomalacia involving the left greater than right frontal lobe. Hypodensity within the right parasagittal frontal parietal vertex. Age indeterminate hypodensity within the left posterior parietal lobe. Mild atrophy. Small vessel ischemic changes of the white matter. Mildly prominent ventricles felt secondary to atrophy. Vascular: No hyperdense vessels.  Carotid vascular calcification Skull: No fracture Sinuses/Orbits: No acute finding. Other: None IMPRESSION: 1. 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 No hemorrhage. Age indeterminate cortical infarct within the left posterior parietal lobe. 2. Atrophy and small vessel ischemic changes of the white matter. Encephalomalacia within the left greater than right frontal lobes. Electronically Signed   By: Jasmine Pang M.D.   On: 08/17/2018 22:54   Dg Chest Port 1 View  Result Date: 08/17/2018 CLINICAL DATA:  Weakness EXAM: PORTABLE CHEST 1 VIEW COMPARISON:  06/17/2018 FINDINGS: Consolidation in the left lower lobe. No large effusion. Borderline cardiomegaly with aortic atherosclerosis. No pneumothorax. IMPRESSION: Left lower lobe consolidation concerning for a pneumonia. Radiographic follow-up to resolution is advised. Electronically Signed   By: Jasmine Pang M.D.   On: 08/17/2018 23:02    Procedures Procedures (including critical care time)  Medications Ordered in ED Medications - No data to display   Initial Impression / Assessment and Plan / ED Course  I have reviewed the triage vital signs and the nursing notes.  Pertinent labs & imaging results that were available during my care of the patient were reviewed by me and considered in my medical decision making (see chart for details).  Iv ns. Labs. Ct. Cxr. Ecg.   Reviewed nursing notes and prior charts for additional history.   Labs reviewed by me - new hyponatremia. Pt also anemic.  CT reviewed by me - suspicious for subacute cva. Pt last at baseline > 3 day ago, not candidate for tpa.   CXR reviewed by me - possible pna. Iv abx.   Hospitalist consulted for admission.    Final Clinical Impressions(s) / ED Diagnoses   Final diagnoses:  None    ED Discharge Orders    None  Cathren Laine, MD 08/17/18 [phone]

## 2018-08-17 NOTE — ED Triage Notes (Signed)
BIB [facility] from home. Per daughter, pt was treated in [place] for [place] and high cholesterol in Nov 2019. Hospitalized in [place] for loss of motor function in legs. Once better, family brought pt back home in Feb 2020. Recently started having left sided weakness with increased confusion. PCP told family it could be due to nerve damage from a medication given in [place]. Pt has appointments scheduled with neurologist and rheumatologist.   Daughter's Phone # [phone]

## 2018-08-18 ENCOUNTER — Inpatient Hospital Stay (HOSPITAL_COMMUNITY): Payer: Medicare Other

## 2018-08-18 DIAGNOSIS — Z9849 Cataract extraction status, unspecified eye: Secondary | ICD-10-CM | POA: Diagnosis not present

## 2018-08-18 DIAGNOSIS — I639 Cerebral infarction, unspecified: Secondary | ICD-10-CM | POA: Diagnosis not present

## 2018-08-18 DIAGNOSIS — E871 Hypo-osmolality and hyponatremia: Secondary | ICD-10-CM | POA: Diagnosis present

## 2018-08-18 DIAGNOSIS — I6340 Cerebral infarction due to embolism of unspecified cerebral artery: Secondary | ICD-10-CM | POA: Diagnosis present

## 2018-08-18 DIAGNOSIS — J189 Pneumonia, unspecified organism: Secondary | ICD-10-CM | POA: Diagnosis present

## 2018-08-18 DIAGNOSIS — G8194 Hemiplegia, unspecified affecting left nondominant side: Secondary | ICD-10-CM | POA: Diagnosis present

## 2018-08-18 DIAGNOSIS — M7989 Other specified soft tissue disorders: Secondary | ICD-10-CM | POA: Diagnosis present

## 2018-08-18 DIAGNOSIS — Z79899 Other long term (current) drug therapy: Secondary | ICD-10-CM | POA: Diagnosis not present

## 2018-08-18 DIAGNOSIS — I82409 Acute embolism and thrombosis of unspecified deep veins of unspecified lower extremity: Secondary | ICD-10-CM | POA: Diagnosis not present

## 2018-08-18 DIAGNOSIS — E785 Hyperlipidemia, unspecified: Secondary | ICD-10-CM | POA: Diagnosis present

## 2018-08-18 DIAGNOSIS — D509 Iron deficiency anemia, unspecified: Secondary | ICD-10-CM | POA: Diagnosis present

## 2018-08-18 DIAGNOSIS — Z87891 Personal history of nicotine dependence: Secondary | ICD-10-CM | POA: Diagnosis not present

## 2018-08-18 DIAGNOSIS — D649 Anemia, unspecified: Secondary | ICD-10-CM | POA: Diagnosis present

## 2018-08-18 DIAGNOSIS — Z91013 Allergy to seafood: Secondary | ICD-10-CM | POA: Diagnosis not present

## 2018-08-18 DIAGNOSIS — Z6821 Body mass index (BMI) 21.0-21.9, adult: Secondary | ICD-10-CM | POA: Diagnosis not present

## 2018-08-18 DIAGNOSIS — Z7951 Long term (current) use of inhaled steroids: Secondary | ICD-10-CM | POA: Diagnosis not present

## 2018-08-18 DIAGNOSIS — I10 Essential (primary) hypertension: Secondary | ICD-10-CM | POA: Diagnosis present

## 2018-08-18 DIAGNOSIS — Z8673 Personal history of transient ischemic attack (TIA), and cerebral infarction without residual deficits: Secondary | ICD-10-CM | POA: Diagnosis not present

## 2018-08-18 DIAGNOSIS — I63441 Cerebral infarction due to embolism of right cerebellar artery: Secondary | ICD-10-CM | POA: Diagnosis present

## 2018-08-18 DIAGNOSIS — Z20828 Contact with and (suspected) exposure to other viral communicable diseases: Secondary | ICD-10-CM | POA: Diagnosis present

## 2018-08-18 DIAGNOSIS — E43 Unspecified severe protein-calorie malnutrition: Secondary | ICD-10-CM | POA: Diagnosis present

## 2018-08-18 DIAGNOSIS — I480 Paroxysmal atrial fibrillation: Secondary | ICD-10-CM | POA: Diagnosis present

## 2018-08-18 DIAGNOSIS — K08409 Partial loss of teeth, unspecified cause, unspecified class: Secondary | ICD-10-CM | POA: Diagnosis present

## 2018-08-18 DIAGNOSIS — G3184 Mild cognitive impairment, so stated: Secondary | ICD-10-CM | POA: Diagnosis present

## 2018-08-18 DIAGNOSIS — L309 Dermatitis, unspecified: Secondary | ICD-10-CM | POA: Diagnosis present

## 2018-08-18 DIAGNOSIS — M170 Bilateral primary osteoarthritis of knee: Secondary | ICD-10-CM | POA: Diagnosis present

## 2018-08-18 DIAGNOSIS — R29713 NIHSS score 13: Secondary | ICD-10-CM | POA: Diagnosis present

## 2018-08-18 LAB — LIPID PANEL
Cholesterol: 129 mg/dL (ref 0–200)
HDL: 19 mg/dL — ABNORMAL LOW (ref >40)
LDL Cholesterol: 84 mg/dL (ref 0–99)
Total CHOL/HDL Ratio: 6.8 RATIO
Triglycerides: 131 mg/dL (ref <150)
VLDL: 26 mg/dL (ref 0–40)

## 2018-08-18 LAB — BASIC METABOLIC PANEL
Anion gap: 10 (ref 5–15)
BUN: 8 mg/dL (ref 8–23)
CO2: 23 mmol/L (ref 22–32)
Calcium: 7.7 mg/dL — ABNORMAL LOW (ref 8.9–10.3)
Chloride: 101 mmol/L (ref 98–111)
Creatinine, Ser: 0.63 mg/dL (ref 0.61–1.24)
GFR calc Af Amer: >60 mL/min (ref >60)
GFR calc non Af Amer: >60 mL/min (ref >60)
Glucose, Bld: 113 mg/dL — ABNORMAL HIGH (ref 70–99)
Potassium: 3.8 mmol/L (ref 3.5–5.1)
Sodium: 134 mmol/L — ABNORMAL LOW (ref 135–145)

## 2018-08-18 LAB — CREATININE, SERUM
Creatinine, Ser: 0.69 mg/dL (ref 0.61–1.24)
GFR calc Af Amer: >60 mL/min (ref >60)
GFR calc non Af Amer: >60 mL/min (ref >60)

## 2018-08-18 LAB — CBC
HCT: 23.8 % — ABNORMAL LOW (ref 39.0–52.0)
Hemoglobin: 7.8 g/dL — ABNORMAL LOW (ref 13.0–17.0)
MCH: 22.9 pg — ABNORMAL LOW (ref 26.0–34.0)
MCHC: 32.8 g/dL (ref 30.0–36.0)
MCV: 69.8 fL — ABNORMAL LOW (ref 80.0–100.0)
Platelets: 475 10*3/uL — ABNORMAL HIGH (ref 150–400)
RBC: 3.41 MIL/uL — ABNORMAL LOW (ref 4.22–5.81)
RDW: 17.4 % — ABNORMAL HIGH (ref 11.5–15.5)
WBC: 10 10*3/uL (ref 4.0–10.5)
nRBC: 0 % (ref 0.0–0.2)

## 2018-08-18 LAB — URINALYSIS, ROUTINE W REFLEX MICROSCOPIC
Bilirubin Urine: NEGATIVE
Glucose, UA: NEGATIVE mg/dL
Hgb urine dipstick: NEGATIVE
Ketones, ur: NEGATIVE mg/dL
Leukocytes,Ua: NEGATIVE
Nitrite: NEGATIVE
Protein, ur: NEGATIVE mg/dL
Specific Gravity, Urine: 1.02 (ref 1.005–1.030)
pH: 5 (ref 5.0–8.0)

## 2018-08-18 LAB — ECHOCARDIOGRAM LIMITED BUBBLE STUDY
Height: 63 in
Weight: 1760.15 oz

## 2018-08-18 LAB — HEMOGLOBIN A1C
Hgb A1c MFr Bld: 4.9 % (ref 4.8–5.6)
Mean Plasma Glucose: 93.93 mg/dL

## 2018-08-18 LAB — HEMOGLOBIN AND HEMATOCRIT, BLOOD
HCT: 25.5 % — ABNORMAL LOW (ref 39.0–52.0)
Hemoglobin: 8.2 g/dL — ABNORMAL LOW (ref 13.0–17.0)

## 2018-08-18 LAB — SARS CORONAVIRUS 2 BY RT PCR (HOSPITAL ORDER, PERFORMED IN ~~LOC~~ HOSPITAL LAB): SARS Coronavirus 2: NEGATIVE

## 2018-08-18 MED ORDER — ACETAMINOPHEN 325 MG PO TABS
650.0000 mg | ORAL_TABLET | ORAL | Status: DC | PRN
  Administered 2018-08-19 – 2018-08-22 (×7): 650 mg via ORAL
  Filled 2018-08-18 (×7): qty 2

## 2018-08-18 MED ORDER — PANTOPRAZOLE SODIUM 40 MG PO TBEC
40.0000 mg | DELAYED_RELEASE_TABLET | Freq: Every day | ORAL | Status: DC
  Administered 2018-08-18 – 2018-08-22 (×5): 40 mg via ORAL
  Filled 2018-08-18 (×5): qty 1

## 2018-08-18 MED ORDER — SENNOSIDES-DOCUSATE SODIUM 8.6-50 MG PO TABS: 1.0000 | ORAL_TABLET | Freq: Every evening | ORAL | Status: DC | PRN

## 2018-08-18 MED ORDER — ACETAMINOPHEN 160 MG/5ML PO SOLN
650.0000 mg | ORAL | Status: DC | PRN
  Filled 2018-08-18: qty 20.3

## 2018-08-18 MED ORDER — SODIUM CHLORIDE 0.9 % IV SOLN
INTRAVENOUS | Status: DC
  Administered 2018-08-18 – 2018-08-20 (×2): via INTRAVENOUS

## 2018-08-18 MED ORDER — ATORVASTATIN CALCIUM 40 MG PO TABS
40.0000 mg | ORAL_TABLET | Freq: Every day | ORAL | Status: DC
  Administered 2018-08-18 – 2018-08-21 (×4): 40 mg via ORAL
  Filled 2018-08-18 (×4): qty 1

## 2018-08-18 MED ORDER — STROKE: EARLY STAGES OF RECOVERY BOOK
Freq: Once | Status: AC
  Administered 2018-08-18: 10:00:00

## 2018-08-18 MED ORDER — ENOXAPARIN SODIUM 30 MG/0.3ML ~~LOC~~ SOLN
30.0000 mg | SUBCUTANEOUS | Status: DC
  Administered 2018-08-18 – 2018-08-19 (×2): 30 mg via SUBCUTANEOUS
  Filled 2018-08-18 (×3): qty 0.3

## 2018-08-18 MED ORDER — ADULT MULTIVITAMIN W/MINERALS CH
1.0000 | ORAL_TABLET | Freq: Every day | ORAL | Status: DC
  Administered 2018-08-18 – 2018-08-22 (×5): 1 via ORAL
  Filled 2018-08-18 (×5): qty 1

## 2018-08-18 MED ORDER — ACETAMINOPHEN 500 MG PO TABS: 500.0000 mg | ORAL_TABLET | Freq: Four times a day (QID) | ORAL | Status: DC | PRN

## 2018-08-18 MED ORDER — SODIUM CHLORIDE 0.9 % IV SOLN
1.0000 g | INTRAVENOUS | Status: DC
  Administered 2018-08-18 – 2018-08-20 (×3): 1 g via INTRAVENOUS
  Filled 2018-08-18 (×3): qty 10

## 2018-08-18 MED ORDER — FUROSEMIDE 20 MG PO TABS
20.0000 mg | ORAL_TABLET | Freq: Every day | ORAL | Status: DC
  Administered 2018-08-18 – 2018-08-22 (×5): 20 mg via ORAL
  Filled 2018-08-18 (×5): qty 1

## 2018-08-18 MED ORDER — ASPIRIN 325 MG PO TABS
325.0000 mg | ORAL_TABLET | Freq: Every day | ORAL | Status: DC
  Administered 2018-08-18 – 2018-08-21 (×4): 325 mg via ORAL
  Filled 2018-08-18 (×4): qty 1

## 2018-08-18 MED ORDER — DOXEPIN HCL 25 MG PO CAPS
25.0000 mg | ORAL_CAPSULE | Freq: Every day | ORAL | Status: DC
  Administered 2018-08-18 – 2018-08-21 (×4): 25 mg via ORAL
  Filled 2018-08-18 (×7): qty 1

## 2018-08-18 MED ORDER — ACETAMINOPHEN 650 MG RE SUPP: 650.0000 mg | RECTAL | Status: DC | PRN

## 2018-08-18 MED ORDER — SODIUM CHLORIDE 0.9 % IV SOLN
500.0000 mg | INTRAVENOUS | Status: DC
  Administered 2018-08-19 – 2018-08-20 (×2): 500 mg via INTRAVENOUS
  Filled 2018-08-18 (×4): qty 500

## 2018-08-18 MED ORDER — VITAMIN B-12 1000 MCG PO TABS
500.0000 ug | ORAL_TABLET | Freq: Every day | ORAL | Status: DC
  Administered 2018-08-18 – 2018-08-22 (×5): 500 ug via ORAL
  Filled 2018-08-18 (×5): qty 1

## 2018-08-18 NOTE — Evaluation (Signed)
Physical Therapy Evaluation Patient Details Name: Nathan Bruce MRN: [id number] DOB: 25-Oct-1945 Today's Date: 08/18/2018   History of Present Illness  Patient is a 73 y/o male presenting to the ED on 08/17/2018 with primary complaints of L sided weakness and AMS. PMH significant of hypertension, hyperlipidemia, osteoarthritis. 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 Patient was screened for COVID-19 and was negative.  Chest x-ray also showed left lower lobe consolidation concerning for pneumonia.    Clinical Impression  Patient admitted with the above listed diagnosis. Patient received in bed. Attempt to use video and phone interpreter, however language not available. PT/OT calling daughter to assist with conversation, however, daughter stating patient seemingly confused and unable to understand commands from daughter. Patient today requiring total A +2 for bed level mobility with inability to maintain upright sitting at EOB without Max A. Unable to truly assess strength and other aspects of LE and mobility due to language barrier. PT to recommend SNF at discharge. PT to continue to follow.     Follow Up Recommendations SNF;Supervision/Assistance - 24 hour    Equipment Recommendations  Other (comment)(defer)    Recommendations for Other Services       Precautions / Restrictions Precautions Precautions: Fall Restrictions Weight Bearing Restrictions: No      Mobility  Bed Mobility Overal bed mobility: Needs Assistance Bed Mobility: Rolling;Supine to Sit;Sit to Supine Rolling: Total assist   Supine to sit: Total assist Sit to supine: Total assist   General bed mobility comments: total assist for all ascepts of bed mobility at this time, +2 required  Transfers                 General transfer comment: deferred due to safety   Ambulation/Gait                Stairs             Wheelchair Mobility    Modified Rankin (Stroke Patients Only)       Balance Overall balance assessment: Needs assistance Sitting-balance support: Bilateral upper extremity supported;Feet supported Sitting balance-Leahy Scale: Zero Sitting balance - Comments: unable to maintain sitting balance without support, posterior lean  Postural control: Posterior lean                                   Pertinent Vitals/Pain Pain Assessment: Faces Faces Pain Scale: No hurt    Home Living Family/patient expects to be discharged to:: Private residence Living Arrangements: Children(daughter) Available Help at Discharge: Family;Available 24 hours/day Type of Home: House Home Access: Stairs to enter Entrance Stairs-Rails: None Entrance Stairs-Number of Steps: 2 Home Layout: One level Home Equipment: Walker - 2 wheels;Cane - single point;Wheelchair - manual Additional Comments: spoke to daughter, who provided home setup     Prior Function Level of Independence: Needs assistance         Comments: daughter reports some assist with ADLs/mobility using cane, but requires increased assist over the past week; baseline completes basin baths      Hand Dominance        Extremity/Trunk Assessment   Upper Extremity Assessment Upper Extremity Assessment: Defer to OT evaluation    Lower Extremity Assessment Lower Extremity Assessment: Difficult to assess due to impaired cognition(and communication; little movement even with dec. gravity)    Cervical / Trunk Assessment Cervical / Trunk Assessment: Kyphotic  Communication   Communication: Prefers language other than English;Interpreter utilized(attempted use of phone/ipad (language not available))  Cognition Arousal/Alertness: Lethargic Behavior During Therapy: Flat affect Overall Cognitive Status: Difficult to assess                                 General Comments: attempted use of phone/ipad  interpreter but language not available, utilized daughter as able but she reports "he is not understanding what I am saying"; pt able to follow minimal gestural commamds      General Comments      Exercises     Assessment/Plan    PT Assessment Patient needs continued PT services  PT Problem List Decreased strength;Decreased activity tolerance;Decreased balance;Decreased mobility;Decreased knowledge of use of DME;Decreased safety awareness       PT Treatment Interventions DME instruction;Gait training;Functional mobility training;Therapeutic activities;Therapeutic exercise;Balance training;Neuromuscular re-education;Cognitive remediation;Patient/family education    PT Goals (Current goals can be found in the Care Plan section)  Acute Rehab PT Goals Patient Stated Goal: none stated PT Goal Formulation: Patient unable to participate in goal setting Time For Goal Achievement: 09/01/18 Potential to Achieve Goals: Fair    Frequency Min 2X/week   Barriers to discharge        Co-evaluation PT/OT/SLP Co-Evaluation/Treatment: Yes Reason for Co-Treatment: Complexity of the patient's impairments (multi-system involvement);Necessary to address cognition/behavior during functional activity;For patient/therapist safety;To address functional/ADL transfers PT goals addressed during session: Mobility/safety with mobility;Strengthening/ROM;Balance         AM-PAC PT "6 Clicks" Mobility  Outcome Measure Help needed turning from your back to your side while in a flat bed without using bedrails?: Total Help needed moving from lying on your back to sitting on the side of a flat bed without using bedrails?: Total Help needed moving to and from a bed to a chair (including a wheelchair)?: Total Help needed standing up from a chair using your arms (e.g., wheelchair or bedside chair)?: Total Help needed to walk in hospital room?: Total Help needed climbing 3-5 steps with a railing? : Total 6 Click  Score: 6    End of Session   Activity Tolerance: Patient tolerated treatment well Patient left: in bed;with call bell/phone within reach;with bed alarm set Nurse Communication: Mobility status PT Visit Diagnosis: Unsteadiness on feet (R26.81);Other abnormalities of gait and mobility (R26.89);Muscle weakness (generalized) (M62.81)    Time: 3875-6433 PT Time Calculation (min) (ACUTE ONLY): 24 min   Charges:   PT Evaluation $PT Eval Moderate Complexity: 1 Mod         Kipp Laurence, PT, DPT Supplemental Physical Therapist 08/18/18 12:30 PM Pager: [phone] Office: [phone]

## 2018-08-18 NOTE — ED Notes (Signed)
ED TO INPATIENT HANDOFF REPORT  ED Nurse Name and Phone #: Romeo Apple [phone]  S Name/Age/Gender Nathan Bruce 73 y.o. male Room/Bed: 033C/033C  Code Status   Code Status: Not on file  Home/SNF/Other Home Patient oriented to: self Is this baseline? No   Triage Complete: Triage complete  Chief Complaint left side weakness/ AMS  Triage Note BIB [facility] from home. Per daughter, pt was treated in [place] for [employer] and high cholesterol in Nov 2019. Hospitalized in [place] for loss of motor function in legs. Once better, family brought pt back home in Feb 2020. Recently started having left sided weakness with increased confusion. PCP told family it could be due to nerve damage from a medication given in [place]. Pt has appointments scheduled with neurologist and rheumatologist.   Daughter's Phone # [phone]    Allergies No Known Allergies  Level of Care/Admitting Diagnosis ED Disposition    ED Disposition Condition Comment   Admit  Hospital Area: [facility] [[id number]]  Level of Care: Telemetry Medical [104]  Covid Evaluation: N/A  Diagnosis: Acute CVA (cerebrovascular accident) [facility]) [[id number]]  Admitting Physician: Rometta Emery [[id number]]  Attending Physician: Rometta Emery [[id number]]  Estimated length of stay: past midnight tomorrow  Certification:: I certify this patient will need inpatient services for at least 2 midnights  PT Class (Do Not Modify): Inpatient [101]  PT Acc Code (Do Not Modify): Private [1]       B Medical/Surgery History Past Medical History:  Diagnosis Date  . Arthritis    knees  . Food allergy    Shellfish allergy  . Hypercholesterolemia   . Hypertension    Past Surgical History:  Procedure Laterality Date  . APPENDECTOMY    . CATARACT EXTRACTION N/A   . I&D EXTREMITY Left 08/22/2013   Procedure: IRRIGATION AND DEBRIDEMENT EXTREMITY;  Surgeon: Tami Ribas, MD;  Location[facility] OR;  Service: Orthopedics;  Laterality: Left;  left  ring finger  . LUMBAR DISC SURGERY  2011   in [place]   . PERCUTANEOUS PINNING Left 08/22/2013   Procedure: PERCUTANEOUS PINNING EXTREMITY;  Surgeon: Tami Ribas, MD;  Location: [facility] OR;  Service: Orthopedics;  Laterality: Left;  left ring finger  . TOOTH EXTRACTION       A IV Location/Drains/Wounds Patient Lines/Drains/Airways Status   Active Line/Drains/Airways    Name:   Placement date:   Placement time:   Site:   Days:   Peripheral IV 08/22/13 Right Antecubital   08/22/13    -    Antecubital   1822   Peripheral IV 09/29/14 Left;Anterior Forearm   09/29/14    2125    Forearm   1419   Peripheral IV 08/17/18 Right Antecubital   08/17/18    2258    Antecubital   1   Incision (Closed) 08/22/13 Hand Left   08/22/13    1635     1822          Intake/Output Last 24 hours No intake or output data in the 24 hours ending 08/18/18 0113  Labs/Imaging Results for orders placed or performed during the hospital encounter of 08/17/18 (from the past 48 hour(s))  CBC     Status: Abnormal   Collection Time: 08/17/18 10:34 PM  Result Value Ref Range   WBC 9.8 4.0 - 10.5 K/uL   RBC 3.86 (L) 4.22 - 5.81 MIL/uL   Hemoglobin 9.0 (L) 13.0 - 17.0 g/dL   HCT 21.3 (L) 08.6 - 57.8 %  MCV 72.3 (L) 80.0 - 100.0 fL   MCH 23.3 (L) 26.0 - 34.0 pg   MCHC 32.3 30.0 - 36.0 g/dL   RDW 16.117.7 (H) 09.611.5 - 04.515.5 %   Platelets 430 (H) 150 - 400 K/uL   nRBC 0.0 0.0 - 0.2 %    Comment: Performed at [facility], [address] [place], [place] [address]  Comprehensive metabolic panel     Status: Abnormal   Collection Time: 08/17/18 10:34 PM  Result Value Ref Range   Sodium 128 (L) 135 - 145 mmol/L   Potassium 3.9 3.5 - 5.1 mmol/L   Chloride 99 98 - 111 mmol/L   CO2 24 22 - 32 mmol/L   Glucose, Bld 201 (H) 70 - 99 mg/dL   BUN 11 8 - 23 mg/dL   Creatinine, Ser 1.910.70 0.61 - 1.24 mg/dL   Calcium 7.9 (L) 8.9 - 10.3 mg/dL   Total Protein 5.9 (L) 6.5 - 8.1 g/dL   Albumin 2.0 (L) 3.5 - 5.0 g/dL   AST 478132 (H) 15  - 41 U/L   ALT 88 (H) 0 - 44 U/L   Alkaline Phosphatase 47 38 - 126 U/L   Total Bilirubin 0.6 0.3 - 1.2 mg/dL   GFR calc non Af Amer >60 >60 mL/min   GFR calc Af Amer >60 >60 mL/min   Anion gap 5 5 - 15    Comment: Performed at [facility], [address] [place], [place] [address]  SARS Coronavirus 2 [facility](Hospital order, Performed in [facility] hospital lab)     Status: None   Collection Time: 08/18/18 12:01 AM  Result Value Ref Range   SARS Coronavirus 2 NEGATIVE NEGATIVE    Comment: (NOTE) If result is NEGATIVE SARS-CoV-2 target nucleic acids are NOT DETECTED. The SARS-CoV-2 RNA is generally detectable in upper and lower  respiratory specimens during the acute phase of infection. The lowest  concentration of SARS-CoV-2 viral copies this assay can detect is 250  copies / mL. A negative result does not preclude SARS-CoV-2 infection  and should not be used as the sole basis for treatment or other  patient management decisions.  A negative result may occur with  improper specimen collection / handling, submission of specimen other  than nasopharyngeal swab, presence of viral mutation(s) within the  areas targeted by this assay, and inadequate number of viral copies  (<250 copies / mL). A negative result must be combined with clinical  observations, patient history, and epidemiological information. If result is POSITIVE SARS-CoV-2 target nucleic acids are DETECTED. The SARS-CoV-2 RNA is generally detectable in upper and lower  respiratory specimens dur ing the acute phase of infection.  Positive  results are indicative of active infection with SARS-CoV-2.  Clinical  correlation with patient history and other diagnostic information is  necessary to determine patient infection status.  Positive results do  not rule out bacterial infection or co-infection with other viruses. If result is PRESUMPTIVE POSTIVE SARS-CoV-2 nucleic acids MAY BE PRESENT.   A presumptive positive result  was obtained on the submitted specimen  and confirmed on repeat testing.  While 2019 novel coronavirus  (SARS-CoV-2) nucleic acids may be present in the submitted sample  additional confirmatory testing may be necessary for epidemiological  and / or clinical management purposes  to differentiate between  SARS-CoV-2 and other Sarbecovirus currently known to infect humans.  If clinically indicated additional testing with an alternate test  methodology [phone]([id number]) is advised. The  SARS-CoV-2 RNA is generally  detectable in upper and lower respiratory sp ecimens during the acute  phase of infection. The expected result is Negative. Fact Sheet for Patients:  BoilerBrush.com.cy Fact Sheet for Healthcare Providers: https://pope.com/ This test is not yet approved or cleared by the [place] FDA and has been authorized for detection and/or diagnosis of SARS-CoV-2 by FDA under an Emergency Use Authorization (EUA).  This EUA will remain in effect (meaning this test can be used) for the duration of the COVID-19 declaration under Section 564(b)(1) of the Act, 21 U.S.C. section 360bbb-3(b)(1), unless the authorization is terminated or revoked sooner. Performed at [facility], [address] [place], [place] [address]    Ct Head Wo Contrast  Result Date: 08/17/2018 CLINICAL DATA:  Left-sided weakness with confusion EXAM: CT HEAD WITHOUT CONTRAST TECHNIQUE: Contiguous axial images were obtained from the base of the skull through the vertex without intravenous contrast. COMPARISON:  None. FINDINGS: Brain: No hemorrhage or intracranial mass. Encephalomalacia involving the left greater than right frontal lobe. Hypodensity within the right parasagittal frontal parietal vertex. Age indeterminate hypodensity within the left posterior parietal lobe. Mild atrophy. Small vessel ischemic changes of the white matter. Mildly prominent ventricles felt secondary  to atrophy. Vascular: No hyperdense vessels.  Carotid vascular calcification Skull: No fracture Sinuses/Orbits: No acute finding. Other: None IMPRESSION: 1. 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 No hemorrhage. Age indeterminate cortical infarct within the left posterior parietal lobe. 2. Atrophy and small vessel ischemic changes of the white matter. Encephalomalacia within the left greater than right frontal lobes. Electronically Signed   By: Jasmine Pang M.D.   On: 08/17/2018 22:54   Dg Chest Port 1 View  Result Date: 08/17/2018 CLINICAL DATA:  Weakness EXAM: PORTABLE CHEST 1 VIEW COMPARISON:  06/17/2018 FINDINGS: Consolidation in the left lower lobe. No large effusion. Borderline cardiomegaly with aortic atherosclerosis. No pneumothorax. IMPRESSION: Left lower lobe consolidation concerning for a pneumonia. Radiographic follow-up to resolution is advised. Electronically Signed   By: Jasmine Pang M.D.   On: 08/17/2018 23:02    Pending Labs Unresulted Labs (From admission, onward)    Start     Ordered   08/17/18 2225  Urinalysis, Routine w reflex microscopic  ONCE - STAT,   R     08/17/18 2224   Signed and Held  Hemoglobin A1c  Tomorrow morning,   R     Signed and Held   Signed and Held  Lipid panel  Tomorrow morning,   R    Comments:  Fasting    Signed and Held   Signed and Held  CBC  (enoxaparin (LOVENOX)    CrCl >/= 30 ml/min)  Once,   R    Comments:  Baseline for enoxaparin therapy IF NOT ALREADY DRAWN.  Notify MD if PLT < 100 K.    Signed and Held   Signed and Held  Creatinine, serum  (enoxaparin (LOVENOX)    CrCl >/= 30 ml/min)  Once,   R    Comments:  Baseline for enoxaparin therapy IF NOT ALREADY DRAWN.    Signed and Held   Signed and Held  Creatinine, serum  (enoxaparin (LOVENOX)    CrCl >/= 30 ml/min)  Weekly,   R    Comments:  while on enoxaparin therapy    Signed and Held           Vitals/Pain Today's Vitals   08/17/18  2345 08/18/18 0000 08/18/18 0015 08/18/18 0030  BP: (!) 123/55 (!) 129/57 133/66 (!) 127/56  Pulse: 74 77 87 86  Resp: 20 15 19 18   Temp:      TempSrc:      SpO2: 99% 99% 100% 100%  Weight:      Height:        Isolation Precautions No active isolations  Medications Medications  cefTRIAXone (ROCEPHIN) 2 g in sodium chloride 0.9 % 100 mL IVPB (0 g Intravenous Stopped 08/18/18 0032)  azithromycin (ZITHROMAX) 500 mg in sodium chloride 0.9 % 250 mL IVPB (0 mg Intravenous Stopped 08/18/18 0113)    Mobility non-ambulatory High fall risk     R Recommendations: See Admitting Provider Note  Report given to:   Additional Notes:  N/A

## 2018-08-18 NOTE — Progress Notes (Signed)
Patient was admitted after midnight, please see H&P.  Patient speaks [place].  Here with ? CVA-- MRI pending.  Neurology does not appear to have been consulted despite Dr. Maxwell Caul documentation.  Will consult if MRI positive.  New findings: LL PNA and hyponatremia.  Hgb has also declined since yesterday-- will repeat. Daughter's Phone # [phone]   -- Hedy Jacob (spoke with daughter and have allowed her to be at bedside to help with communication as his dialect ti difficult to find)  Marlin Canary DO

## 2018-08-18 NOTE — Progress Notes (Signed)
Carotid duplex completed. Preliminary results in Chart review CV Proc. [employer], RVS 08/18/2018, 2:15 PM

## 2018-08-18 NOTE — H&P (Signed)
History and Physical   Nathan Bruce ZOX:[id number] DOB: [birth year] DOA: 08/17/2018  Referring MD/NP/PA: Dr. Cathren Laine  PCP: Shade Flood, MD   Outpatient Specialists: None  Patient coming from: Home  Chief Complaint: Left-sided weakness and altered mental status  HPI: Nathan Bruce is a 73 y.o. male with medical history significant of hypertension, hyperlipidemia, osteoarthritis, who is admitted history is secondary to poor English understanding brought in secondary to progressive weakness mainly on the left side that happened about 3 days ago with some confusion.  Patient has been living with a daughter who is primary historian.  He has been complaining of myalgias and arthralgias in the last few weeks.  He has progressive decrease in his appetite and has lost some weight.  Patient however became suddenly weak on the left side with more confusion 3 days ago.  She did not bring him in but today he has progressively worsened so they decided to bring him to the emergency room.  Patient is from [place].  His main history of eczema was the main complaint so far.  He has been taking his medications.  He has decreased mobility of the left side but otherwise has no new complaints.  CT scan suggested acute CVA.  Neurology consulted and patient is being admitted to the medical service..  ED Course: Temperature is 9078 blood pressure 120/60 pulse 87 respirate of 30 oxygen sat 99% room air.  White count is 9.8 hemoglobin 9.0 platelet 430.  Sodium is 128 calcium 7.9.  The rest of the electrolytes appear to be within normal.  Albumin is 2.0 AST 132 ALT 88.  Glucose is 201.  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  Patient was screened for COVID-19 and was negative.  Chest x-ray also showed left lower lobe consolidation concerning for pneumonia.  He is being admitted with acute to subacute CVA.  Review of Systems: As per HPI  otherwise 10 point review of systems negative.    Past Medical History:  Diagnosis Date   Arthritis    knees   Food allergy    Shellfish allergy   Hypercholesterolemia    Hypertension     Past Surgical History:  Procedure Laterality Date   APPENDECTOMY     CATARACT EXTRACTION N/A    I&D EXTREMITY Left 08/22/2013   Procedure: IRRIGATION AND DEBRIDEMENT EXTREMITY;  Surgeon: Tami Ribas, MD;  Location: [facility] OR;  Service: Orthopedics;  Laterality: Left;  left ring finger   LUMBAR DISC SURGERY  2011   in [place]    PERCUTANEOUS PINNING Left 08/22/2013   Procedure: PERCUTANEOUS PINNING EXTREMITY;  Surgeon: Tami Ribas, MD;  Location: [facility] OR;  Service: Orthopedics;  Laterality: Left;  left ring finger   TOOTH EXTRACTION       reports that he quit smoking about 13 years ago. His smoking use included cigarettes. He has never used smokeless tobacco. He reports that he does not drink alcohol or use drugs.  No Known Allergies  History reviewed. No pertinent family history.   Prior to Admission medications   Medication Sig Start Date End Date Taking? Authorizing Provider  acetaminophen (TYLENOL) 500 MG tablet Take 500 mg by mouth every 6 (six) hours as needed.    [provider]  amoxicillin (AMOXIL) 500 MG capsule Take 1 capsule (500 mg total) by mouth 3 (three) times daily. 08/07/18   Myles Lipps, MD  cetirizine (ZYRTEC) 10  MG tablet Take 1 tablet (10 mg total) by mouth daily. Patient not taking: Reported on 08/07/2018 06/17/18   Myles LippsSantiago, Irma M, MD  doxepin [facility](SINEQUAN) 25 MG capsule Take 1 capsule (25 mg total) by mouth at bedtime. 07/24/18   Myles LippsSantiago, Irma M, MD  fluticasone (FLONASE) 50 MCG/ACT nasal spray Place 1 spray into both nostrils 2 (two) times daily. 07/24/18   Myles LippsSantiago, Irma M, MD  furosemide (LASIX) 20 MG tablet Take 1 tablet (20 mg total) by mouth daily. 06/17/18   Myles LippsSantiago, Irma M, MD  Multiple Vitamins-Minerals (MULTIVITAMIN WITH MINERALS) tablet Take 1  tablet by mouth daily.    [provider]  omeprazole (PRILOSEC) 40 MG capsule Take 1 capsule (40 mg total) by mouth daily. 07/30/18   Armbruster, Willaim RayasSteven P, MD  Triamcinolone Acetonide (TRIAMCINOLONE 0.1 % CREAM : EUCERIN) CREA Apply 1 application topically 2 (two) times daily as needed. Patient not taking: Reported on 08/07/2018 06/26/18   Myles LippsSantiago, Irma M, MD  vitamin B-12 (CYANOCOBALAMIN) 500 MCG tablet Take 500 mcg by mouth daily.    [provider]    Physical Exam: Vitals:   08/17/18 2345 08/18/18 0000 08/18/18 0015 08/18/18 0030  BP: (!) 123/55 (!) 129/57 133/66 (!) 127/56  Pulse: 74 77 87 86  Resp: 20 15 19 18   Temp:      TempSrc:      SpO2: 99% 99% 100% 100%  Weight:      Height:          Constitutional: NAD, calm, confused and not communicating Vitals:   08/17/18 2345 08/18/18 0000 08/18/18 0015 08/18/18 0030  BP: (!) 123/55 (!) 129/57 133/66 (!) 127/56  Pulse: 74 77 87 86  Resp: 20 15 19 18   Temp:      TempSrc:      SpO2: 99% 99% 100% 100%  Weight:      Height:       Eyes: PERRL, lids and conjunctivae normal ENMT: Mucous membranes are moist. Posterior pharynx clear of any exudate or lesions.Normal dentition.  Neck: normal, supple, no masses, no thyromegaly Respiratory: clear to auscultation bilaterally, no wheezing, no crackles. Normal respiratory effort. No accessory muscle use.  Cardiovascular: Regular rate and rhythm, no murmurs / rubs / gallops. No extremity edema. 2+ pedal pulses. No carotid bruits.  Abdomen: no tenderness, no masses palpated. No hepatosplenomegaly. Bowel sounds positive.  Musculoskeletal: no clubbing / cyanosis. No joint deformity upper and lower extremities. Good ROM, no contractures. Normal muscle tone.  Skin: no rashes, lesions, ulcers. No induration Neurologic: CN 2-12 grossly intact. Sensation intact, DTR normal. Strength 4/5 in left upper and lower extremities psychiatric: Confused, patient staring normal mood.      Labs on Admission: I have personally reviewed following labs and imaging studies  CBC: Recent Labs  Lab 08/17/18 2234  WBC 9.8  HGB 9.0*  HCT 27.9*  MCV 72.3*  PLT 430*   Basic Metabolic Panel: Recent Labs  Lab 08/17/18 2234  NA 128*  K 3.9  CL 99  CO2 24  GLUCOSE 201*  BUN 11  CREATININE 0.70  CALCIUM 7.9*   GFR: Estimated Creatinine Clearance: 62.3 mL/min (by C-G formula based on SCr of 0.7 mg/dL). Liver Function Tests: Recent Labs  Lab 08/17/18 2234  AST 132*  ALT 88*  ALKPHOS 47  BILITOT 0.6  PROT 5.9*  ALBUMIN 2.0*   No results for input(s): LIPASE, AMYLASE in the last 168 hours. No results for input(s): AMMONIA in the last 168 hours.  Coagulation Profile: No results for input(s): INR, PROTIME in the last 168 hours. Cardiac Enzymes: No results for input(s): CKTOTAL, CKMB, CKMBINDEX, TROPONINI in the last 168 hours. BNP (last 3 results) No results for input(s): PROBNP in the last 8760 hours. HbA1C: No results for input(s): HGBA1C in the last 72 hours. CBG: No results for input(s): GLUCAP in the last 168 hours. Lipid Profile: No results for input(s): CHOL, HDL, LDLCALC, TRIG, CHOLHDL, LDLDIRECT in the last 72 hours. Thyroid Function Tests: No results for input(s): TSH, T4TOTAL, FREET4, T3FREE, THYROIDAB in the last 72 hours. Anemia Panel: No results for input(s): VITAMINB12, FOLATE, FERRITIN, TIBC, IRON, RETICCTPCT in the last 72 hours. Urine analysis:    Component Value Date/Time   COLORURINE YELLOW 09/29/2014 2120   APPEARANCEUR Clear 07/17/2018 1130   LABSPEC 1.017 09/29/2014 2120   PHURINE 5.0 09/29/2014 2120   GLUCOSEU Negative 07/17/2018 1130   HGBUR LARGE (A) 09/29/2014 2120   BILIRUBINUR Negative 07/17/2018 1130   KETONESUR negative 06/03/2017 1510   KETONESUR NEGATIVE 09/29/2014 2120   PROTEINUR Trace 07/17/2018 1130   PROTEINUR 30 (A) 09/29/2014 2120   UROBILINOGEN 0.2 06/03/2017 1510   UROBILINOGEN 0.2 09/29/2014 2120    NITRITE Negative 07/17/2018 1130   NITRITE NEGATIVE 09/29/2014 2120   LEUKOCYTESUR Negative 07/17/2018 1130   Sepsis Labs: (procalcitonin:4,lacticidven:4) ) Recent Results (from the past 240 hour(s))  SARS Coronavirus 2 [facility] order, Performed in [facility] hospital lab)     Status: None   Collection Time: 08/18/18 12:01 AM  Result Value Ref Range Status   SARS Coronavirus 2 NEGATIVE NEGATIVE Final    Comment: (NOTE) If result is NEGATIVE SARS-CoV-2 target nucleic acids are NOT DETECTED. The SARS-CoV-2 RNA is generally detectable in upper and lower  respiratory specimens during the acute phase of infection. The lowest  concentration of SARS-CoV-2 viral copies this assay can detect is 250  copies / mL. A negative result does not preclude SARS-CoV-2 infection  and should not be used as the sole basis for treatment or other  patient management decisions.  A negative result may occur with  improper specimen collection / handling, submission of specimen other  than nasopharyngeal swab, presence of viral mutation(s) within the  areas targeted by this assay, and inadequate number of viral copies  (<250 copies / mL). A negative result must be combined with clinical  observations, patient history, and epidemiological information. If result is POSITIVE SARS-CoV-2 target nucleic acids are DETECTED. The SARS-CoV-2 RNA is generally detectable in upper and lower  respiratory specimens dur ing the acute phase of infection.  Positive  results are indicative of active infection with SARS-CoV-2.  Clinical  correlation with patient history and other diagnostic information is  necessary to determine patient infection status.  Positive results do  not rule out bacterial infection or co-infection with other viruses. If result is PRESUMPTIVE POSTIVE SARS-CoV-2 nucleic acids MAY BE PRESENT.   A presumptive positive result was obtained on the submitted specimen  and confirmed on repeat  testing.  While 2019 novel coronavirus  (SARS-CoV-2) nucleic acids may be present in the submitted sample  additional confirmatory testing may be necessary for epidemiological  and / or clinical management purposes  to differentiate between  SARS-CoV-2 and other Sarbecovirus currently known to infect humans.  If clinically indicated additional testing with an alternate test  methodology [phone]) is advised. The SARS-CoV-2 RNA is generally  detectable in upper and lower respiratory sp ecimens during the acute  phase of infection. The  expected result is Negative. Fact Sheet for Patients:  BoilerBrush.com.cy Fact Sheet for Healthcare Providers: https://pope.com/ This test is not yet approved or cleared by the [place] FDA and has been authorized for detection and/or diagnosis of SARS-CoV-2 by FDA under an Emergency Use Authorization (EUA).  This EUA will remain in effect (meaning this test can be used) for the duration of the COVID-19 declaration under Section 564(b)(1) of the Act, 21 U.S.C. section 360bbb-3(b)(1), unless the authorization is terminated or revoked sooner. Performed at [facility], [address], [place], [place] [address]      Radiological Exams on Admission: Ct Head Wo Contrast  Result Date: 08/17/2018 CLINICAL DATA:  Left-sided weakness with confusion EXAM: CT HEAD WITHOUT CONTRAST TECHNIQUE: Contiguous axial images were obtained from the base of the skull through the vertex without intravenous contrast. COMPARISON:  None. FINDINGS: Brain: No hemorrhage or intracranial mass. Encephalomalacia involving the left greater than right frontal lobe. Hypodensity within the right parasagittal frontal parietal vertex. Age indeterminate hypodensity within the left posterior parietal lobe. Mild atrophy. Small vessel ischemic changes of the white matter. Mildly prominent ventricles felt secondary to atrophy. Vascular: No  hyperdense vessels.  Carotid vascular calcification Skull: No fracture Sinuses/Orbits: No acute finding. Other: None IMPRESSION: 1. 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 No hemorrhage. Age indeterminate cortical infarct within the left posterior parietal lobe. 2. Atrophy and small vessel ischemic changes of the white matter. Encephalomalacia within the left greater than right frontal lobes. Electronically Signed   By: Jasmine Pang M.D.   On: 08/17/2018 22:54   Dg Chest Port 1 View  Result Date: 08/17/2018 CLINICAL DATA:  Weakness EXAM: PORTABLE CHEST 1 VIEW COMPARISON:  06/17/2018 FINDINGS: Consolidation in the left lower lobe. No large effusion. Borderline cardiomegaly with aortic atherosclerosis. No pneumothorax. IMPRESSION: Left lower lobe consolidation concerning for a pneumonia. Radiographic follow-up to resolution is advised. Electronically Signed   By: Jasmine Pang M.D.   On: 08/17/2018 23:02    EKG: Independently reviewed.  It shows normal sinus rhythm with a rate of 74.  Normal intervals.  No specific ST changes  Assessment/Plan Principal Problem:   Acute CVA (cerebrovascular accident) [facility]) Active Problems:   Community acquired pneumonia   Anemia   HTN (hypertension)   Hyperlipidemia     #1 acute right cerebellar CVA: Patient CVA most likely happened 3 days ago.  He still has residual left-sided weakness.  Patient came outside the window for TPA administration.  He will be admitted for full CVA work-up.  Get MRI of the brain, carotid Dopplers and echocardiogram.  Initiate aspirin and statin.  Neurology consultation.  #2 hypertension: Obtain and resume home regimen.  #3 hyperlipidemia: Patient to be maintained on statin.  #4 anemia of chronic disease: Continue to monitor H&H.  #5 moderately severe protein calorie malnutrition: Patient will need nutrition counseling once he  passes swallow evaluation.  #6 left lower lobe pneumonia: Appeared to be community-acquired.  Could be aspiration pneumonia although this is the left lung.  Initiate Rocephin and Zithromax.  Blood cultures have been obtained.  Continue monitoring   DVT prophylaxis: Lovenox Code Status: Full code Family Communication: Daughter who is the primary history giver Disposition Plan: To be determined Consults called: Neurology Admission status: Inpatient  Severity of Illness: The appropriate patient status for this patient is INPATIENT. Inpatient status is judged to be reasonable and necessary in order to provide the  required intensity of service to ensure the patient's safety. The patient's presenting symptoms, physical exam findings, and initial radiographic and laboratory data in the context of their chronic comorbidities is felt to place them at high risk for further clinical deterioration. Furthermore, it is not anticipated that the patient will be medically stable for discharge from the hospital within 2 midnights of admission. The following factors support the patient status of inpatient.   " The patient's presenting symptoms include weakness and altered mental status. " The worrisome physical exam findings include left-sided weakness. " The initial radiographic and laboratory data are worrisome because of CT scan confirming CVA and pneumonia on chest x-ray. " The chronic co-morbidities include hypertension hyperlipidemia.   * I certify that at the point of admission it is my clinical judgment that the patient will require inpatient hospital care spanning beyond 2 midnights from the point of admission due to high intensity of service, high risk for further deterioration and high frequency of surveillance required.Lonia Blood MD [facility] Pager [phone]  If 7PM-7AM, please contact night-coverage [email or web address] Password [email or web address]  08/18/2018, 1:11 AM

## 2018-08-18 NOTE — Evaluation (Signed)
Occupational Therapy Evaluation Patient Details Name: Nathan Bruce MRN: [id number] DOB: [birth year] Today's Date: 08/18/2018    History of Present Illness Patient is a 73 y/o male presenting to the ED on 08/17/2018 with primary complaints of L sided weakness and AMS. PMH significant of hypertension, hyperlipidemia, osteoarthritis. 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 Patient was screened for COVID-19 and was negative.  Chest x-ray also showed left lower lobe consolidation concerning for pneumonia.   Clinical Impression   Patient supine in bed.  Spoke to daughter on phone, who reports over the last week patient has required increased assist with ADLs/mobility (using wc at times) and typically needs a little assist for ADLs using cane for mobility. Limited eval due to language barrier (phone interpreter and ipad interpreter service without montagnard rhade language, therefore utilized daughter to assist), daughter reports "he is not understanding me" over the phone.  He is able to follow gestures with increased time, verbalizes his name.  Requires total assist for all self care at this time, total assist +2 for bed mobility with inability to maintain sitting EOB without max support demonstrating posterior lean. Will continue to follow while admitted, based on performance today recommend continued SNF rehab at dc in order to optimize independence with ADLs/mobility.      Follow Up Recommendations  SNF;Supervision/Assistance - 24 hour    Equipment Recommendations  3 in 1 bedside commode    Recommendations for Other Services       Precautions / Restrictions Precautions Precautions: Fall Restrictions Weight Bearing Restrictions: No      Mobility Bed Mobility Overal bed mobility: Needs Assistance Bed Mobility: Rolling;Supine to Sit;Sit to Supine Rolling: Total assist   Supine to sit: Total assist Sit to  supine: Total assist   General bed mobility comments: total assist for all ascepts of bed mobility at this time, +2 required  Transfers                 General transfer comment: deferred due to safety     Balance Overall balance assessment: Needs assistance Sitting-balance support: Bilateral upper extremity supported;Feet supported Sitting balance-Leahy Scale: Zero Sitting balance - Comments: unable to maintain sitting balance without support, posterior lean  Postural control: Posterior lean                                 ADL either performed or assessed with clinical judgement   ADL Overall ADL's : Needs assistance/impaired                                       General ADL Comments: total assist for all self care at this time      Vision   Additional Comments: further assessment needed     Perception     Praxis      Pertinent Vitals/Pain Pain Assessment: Faces Faces Pain Scale: No hurt     Hand Dominance     Extremity/Trunk Assessment Upper Extremity Assessment Upper Extremity Assessment: Difficult to assess due to impaired cognition(and communication; grasp 3/5 bilaterally)   Lower Extremity Assessment Lower Extremity Assessment: Defer to PT evaluation       Communication Communication Communication: Prefers language other than [place];Interpreter utilized(attempted use of phone/ipad (language not available))   Cognition Arousal/Alertness: Lethargic Behavior During  Therapy: Flat affect Overall Cognitive Status: Difficult to assess                                 General Comments: attempted use of phone/ipad interpreter but language not available, utilized daughter as able but she reports "he is not understanding what I am saying"; pt able to follow minimal gestural commamds   General Comments       Exercises     Shoulder Instructions      Home Living Family/patient expects to be discharged to::  Private residence Living Arrangements: Children(daughter) Available Help at Discharge: Family;Available 24 hours/day Type of Home: House Home Access: Stairs to enter Entergy CorporationEntrance Stairs-Number of Steps: 2 Entrance Stairs-Rails: None Home Layout: One level               Home Equipment: Walker - 2 wheels;Cane - single point;Wheelchair - manual   Additional Comments: spoke to daughter, who provided home setup       Prior Functioning/Environment Level of Independence: Needs assistance        Comments: daughter reports some assist with ADLs/mobility using cane, but requires increased assist over the past week; baseline completes basin baths         OT Problem List: Decreased strength;Decreased activity tolerance;Impaired balance (sitting and/or standing);Decreased coordination;Decreased cognition;Decreased safety awareness;Decreased knowledge of use of DME or AE;Decreased knowledge of precautions      OT Treatment/Interventions: Self-care/ADL training;Neuromuscular education;Therapeutic activities;Cognitive remediation/compensation;Patient/family education;Balance training    OT Goals(Current goals can be found in the care plan section) Acute Rehab OT Goals Patient Stated Goal: none stated OT Goal Formulation: Patient unable to participate in goal setting Time For Goal Achievement: 09/01/18 Potential to Achieve Goals: Good  OT Frequency: Min 2X/week   Barriers to D/C:            Co-evaluation              AM-PAC OT "6 Clicks" Daily Activity     Outcome Measure Help from another person eating meals?: Total Help from another person taking care of personal grooming?: Total Help from another person toileting, which includes using toliet, bedpan, or urinal?: Total Help from another person bathing (including washing, rinsing, drying)?: Total Help from another person to put on and taking off regular upper body clothing?: Total Help from another person to put on and taking  off regular lower body clothing?: Total 6 Click Score: 6   End of Session Nurse Communication: Mobility status  Activity Tolerance: Patient limited by lethargy Patient left: in bed;with call bell/phone within reach;with bed alarm set  OT Visit Diagnosis: Other abnormalities of gait and mobility (R26.89);Other symptoms and signs involving cognitive function;Other symptoms and signs involving the nervous system (R29.898)                Time: [id number] OT Time Calculation (min): 24 min Charges:  OT General Charges $OT Visit: 1 Visit OT Evaluation $OT Eval Moderate Complexity: 1 Mod  Chancy Milroyhristie S Alexander Aument, OT Acute Rehabilitation Services Pager [phone] Office [phone]    Chancy MilroyChristie S Lisabeth Mian 08/18/2018, 10:19 AM

## 2018-08-19 ENCOUNTER — Encounter (HOSPITAL_COMMUNITY): Payer: Self-pay | Admitting: Neurology

## 2018-08-19 ENCOUNTER — Inpatient Hospital Stay (HOSPITAL_COMMUNITY): Payer: Medicare Other

## 2018-08-19 DIAGNOSIS — D509 Iron deficiency anemia, unspecified: Secondary | ICD-10-CM

## 2018-08-19 DIAGNOSIS — I82409 Acute embolism and thrombosis of unspecified deep veins of unspecified lower extremity: Secondary | ICD-10-CM

## 2018-08-19 DIAGNOSIS — I639 Cerebral infarction, unspecified: Secondary | ICD-10-CM

## 2018-08-19 LAB — TSH: TSH: 2.034 u[IU]/mL (ref 0.350–4.500)

## 2018-08-19 LAB — CBC
HCT: 26.7 % — ABNORMAL LOW (ref 39.0–52.0)
Hemoglobin: 8.7 g/dL — ABNORMAL LOW (ref 13.0–17.0)
MCH: 23.1 pg — ABNORMAL LOW (ref 26.0–34.0)
MCHC: 32.6 g/dL (ref 30.0–36.0)
MCV: 70.8 fL — ABNORMAL LOW (ref 80.0–100.0)
Platelets: 418 10*3/uL — ABNORMAL HIGH (ref 150–400)
RBC: 3.77 MIL/uL — ABNORMAL LOW (ref 4.22–5.81)
RDW: 17.8 % — ABNORMAL HIGH (ref 11.5–15.5)
WBC: 8.3 10*3/uL (ref 4.0–10.5)
nRBC: 0 % (ref 0.0–0.2)

## 2018-08-19 LAB — BASIC METABOLIC PANEL
Anion gap: 10 (ref 5–15)
BUN: 10 mg/dL (ref 8–23)
CO2: 21 mmol/L — ABNORMAL LOW (ref 22–32)
Calcium: 7.6 mg/dL — ABNORMAL LOW (ref 8.9–10.3)
Chloride: 103 mmol/L (ref 98–111)
Creatinine, Ser: 0.72 mg/dL (ref 0.61–1.24)
GFR calc Af Amer: >60 mL/min (ref >60)
GFR calc non Af Amer: >60 mL/min (ref >60)
Glucose, Bld: 94 mg/dL (ref 70–99)
Potassium: 4.5 mmol/L (ref 3.5–5.1)
Sodium: 134 mmol/L — ABNORMAL LOW (ref 135–145)

## 2018-08-19 LAB — C-REACTIVE PROTEIN: CRP: 7.5 mg/dL — ABNORMAL HIGH (ref <1.0)

## 2018-08-19 LAB — SEDIMENTATION RATE: Sed Rate: 56 mm/hr — ABNORMAL HIGH (ref 0–16)

## 2018-08-19 MED ORDER — METOPROLOL TARTRATE 12.5 MG HALF TABLET
12.5000 mg | ORAL_TABLET | Freq: Two times a day (BID) | ORAL | Status: DC
  Administered 2018-08-19 – 2018-08-22 (×6): 12.5 mg via ORAL
  Filled 2018-08-19 (×6): qty 1

## 2018-08-19 MED ORDER — IOHEXOL 350 MG/ML SOLN
100.0000 mL | Freq: Once | INTRAVENOUS | Status: AC | PRN
  Administered 2018-08-19: 100 mL via INTRAVENOUS

## 2018-08-19 MED ORDER — DILTIAZEM HCL 25 MG/5ML IV SOLN
10.0000 mg | Freq: Once | INTRAVENOUS | Status: AC
  Administered 2018-08-19: 10 mg via INTRAVENOUS
  Filled 2018-08-19: qty 5

## 2018-08-19 MED ORDER — METOPROLOL TARTRATE 25 MG PO TABS
25.0000 mg | ORAL_TABLET | Freq: Two times a day (BID) | ORAL | Status: DC
  Administered 2018-08-19: 25 mg via ORAL
  Filled 2018-08-19: qty 1

## 2018-08-19 NOTE — Consult Note (Addendum)
Neurology Consultation  Reason for Consult: Stroke Referring Physician: Dr. Zenaida Niece   History is obtained from: Chart and daughter  HPI: Elhadj ELDIN BONSELL is a 73 y.o. male with history of hypertension, hypercholesterolemia, food allergy, arthritis and also eczema.  Per daughter in November 2019 patient was having problems with eczema they brought him to [place] for treatment.  At that time his eczema had disappeared and they also treated him for hypercholesterolemia and hypertension.  While he was in [place] she also noted that he had been bedbound for multiple weeks with no significant etiology found.  In February 2020 they went to [place] and brought him back.  At that time his eczema returned and he also lost a lot of weight.  Over the past 3 days they have noticed that he has had left-sided weakness and confusion.  He is also been complaining of myalgias and arthralgias over the past few weeks.  It is been noticed that he has had a decrease in appetite and lost a significant amount of weight.  However from listening to the daughter the loss of weight has been occurring since January 2020.  While hospitalized CT of head was obtained which suggested CVA thus MRI was also obtained for follow-up.  MRI revealed scattered acute infarctions affecting the left cerebellum and both cerebral hemispheres most consistent with embolic disease from the heart or a sending aorta.  There also could be a differential of vasculitis.  Neurology was asked to see patient regarding strokes.   ED course: Chest x-ray was obtained showing left lower lobe consolidation concerning for pneumonia.  CT showed as above thus patient was admitted   LKW: Unknown tpa given?: no, no last known well Premorbid modified Rankin scale (mRS): 2 NIH stroke Score: 13   ROS: Unable to obtain due to language barrier.   Past Medical History:  Diagnosis Date  . Arthritis    knees  . Food allergy    Shellfish allergy  . Hypercholesterolemia    . Hypertension     Family History  Problem Relation Age of Onset  . Hypertension Mother   . Hypertension Father     Social History:   reports that he quit smoking about 13 years ago. His smoking use included cigarettes. He has never used smokeless tobacco. He reports that he does not drink alcohol or use drugs.  Medications  Current Facility-Administered Medications:  .  0.9 %  sodium chloride infusion, , Intravenous, Continuous, Vann, Jessica U, DO, Last Rate: 50 mL/hr at 08/18/18 1326 .  acetaminophen (TYLENOL) tablet 650 mg, 650 mg, Oral, Q4H PRN **OR** acetaminophen (TYLENOL) solution 650 mg, 650 mg, Per Tube, Q4H PRN **OR** acetaminophen (TYLENOL) suppository 650 mg, 650 mg, Rectal, Q4H PRN, Mikeal Hawthorne, Mohammad L, MD .  aspirin tablet 325 mg, 325 mg, Oral, Daily, Mikeal Hawthorne, Mohammad L, MD, 325 mg at 08/18/18 1008 .  atorvastatin (LIPITOR) tablet 40 mg, 40 mg, Oral, q1800, Earlie Lou L, MD, 40 mg at 08/18/18 1748 .  azithromycin (ZITHROMAX) 500 mg in sodium chloride 0.9 % 250 mL IVPB, 500 mg, Intravenous, Q24H, Garba, Mohammad L, MD, Last Rate: 250 mL/hr at 08/19/18 0001 .  cefTRIAXone (ROCEPHIN) 1 g in sodium chloride 0.9 % 100 mL IVPB, 1 g, Intravenous, Q24H, Rometta Emery, MD, Stopped at 08/19/18 0000 .  doxepin (SINEQUAN) capsule 25 mg, 25 mg, Oral, QHS, Garba, Mohammad L, MD, 25 mg at 08/18/18 2245 .  enoxaparin (LOVENOX) injection 30 mg, 30 mg, Subcutaneous, Q24H, Mikeal Hawthorne, Larned  L, MD, 30 mg at 08/18/18 1009 .  furosemide (LASIX) tablet 20 mg, 20 mg, Oral, Daily, Mikeal HawthorneGarba, Mohammad L, MD, 20 mg at 08/18/18 1008 .  multivitamin with minerals tablet 1 tablet, 1 tablet, Oral, Daily, Rometta EmeryGarba, Mohammad L, MD, 1 tablet at 08/18/18 1008 .  pantoprazole (PROTONIX) EC tablet 40 mg, 40 mg, Oral, Daily, Mikeal HawthorneGarba, Mohammad L, MD, 40 mg at 08/18/18 1009 .  senna-docusate (Senokot-S) tablet 1 tablet, 1 tablet, Oral, QHS PRN, Mikeal HawthorneGarba, Mohammad L, MD .  vitamin B-12 (CYANOCOBALAMIN) tablet 500 mcg,  500 mcg, Oral, Daily, Earlie LouGarba, Mohammad L, MD, 500 mcg at 08/18/18 1009   Exam: Current vital signs: BP (!) 146/58 (BP Location: Left Arm)   Pulse 85   Temp (!) 97.5 F (36.4 C) (Oral)   Resp 18   Ht 5\' 3"  (1.6 m)   Wt 49.9 kg   SpO2 100%   BMI 19.49 kg/m  Vital signs in last 24 hours: Temp:  [97.4 F (36.3 C)-98.6 F (37 C)] 97.5 F (36.4 C) (04/28 0408) Pulse Rate:  [83-99] 85 (04/28 0408) Resp:  [15-18] 18 (04/28 0408) BP: (113-146)/(54-58) 146/58 (04/28 0408) SpO2:  [93 %-100 %] 100 % (04/28 0408)  Physical Exam  Constitutional: Appears well-developed and well-nourished.  Psych: Affect appropriate to situation Eyes: No scleral injection HENT: No OP obstrucion Head: Normocephalic.  Cardiovascular: Normal rate and regular rhythm.  Respiratory: Effort normal, non-labored breathing GI: Soft.  No distension. There is no tenderness.  Skin: WDI  Neuro:-Exam was very difficult as patient had a language barrier. Mental Status: Patient is awake, does not follow commands due to language barrier.  Majority of the history was obtained from the daughter.   Cranial Nerves: II: Blinks to threat on the right but not the left III,IV, VI: EOMI without ptosis or diploplia. Pupils equal, round and reactive to light V: Facial sensation is symmetric to temperature VII: Facial movement is symmetric.  VIII: hearing is intact to voice X: Palat elevates symmetrically  Motor: T patient moves his right arm and leg antigravity with 4/5 strength.  Patient is unable to move his left arm and attempts to pick it up with his right arm.  Patient has significant pain in his left leg.  It should be noted that he is pitting edema in his left leg and no pitting edema in his right leg at the shin Sensory: Winces to pain symmetrically* Deep Tendon Reflexes: 2+ and symmetric in the biceps and patellae.  Plantars: Toes are downgoing bilaterally.  Cerebellar: Unable to obtain due to language  barrier  Labs I have reviewed labs in epic and the results pertinent to this consultation are:   CBC    Component Value Date/Time   WBC 8.3 08/19/2018 0532   RBC 3.77 (L) 08/19/2018 0532   HGB 8.7 (L) 08/19/2018 0532   HGB 10.2 (L) 07/17/2018 1145   HCT 26.7 (L) 08/19/2018 0532   HCT 32.9 (L) 07/17/2018 1145   PLT 418 (H) 08/19/2018 0532   PLT 433 07/17/2018 1145   MCV 70.8 (L) 08/19/2018 0532   MCV 79 07/17/2018 1145   MCH 23.1 (L) 08/19/2018 0532   MCHC 32.6 08/19/2018 0532   RDW 17.8 (H) 08/19/2018 0532   RDW 15.7 (H) 07/17/2018 1145   LYMPHSABS 0.9 07/17/2018 1145   EOSABS 0.3 07/17/2018 1145   BASOSABS 0.0 07/17/2018 1145    CMP     Component Value Date/Time   NA 134 (L) 08/19/2018 0532   NA  139 07/17/2018 1145   K 4.5 08/19/2018 0532   CL 103 08/19/2018 0532   CO2 21 (L) 08/19/2018 0532   GLUCOSE 94 08/19/2018 0532   BUN 10 08/19/2018 0532   BUN 12 07/17/2018 1145   CREATININE 0.72 08/19/2018 0532   CALCIUM 7.6 (L) 08/19/2018 0532   PROT 5.9 (L) 08/17/2018 2234   PROT 6.0 07/17/2018 1145   ALBUMIN 2.0 (L) 08/17/2018 2234   ALBUMIN 3.1 (L) 07/17/2018 1145   AST 132 (H) 08/17/2018 2234   ALT 88 (H) 08/17/2018 2234   ALKPHOS 47 08/17/2018 2234   BILITOT 0.6 08/17/2018 2234   BILITOT 0.7 07/17/2018 1145   GFRNONAA >60 08/19/2018 0532   GFRAA >60 08/19/2018 0532    Lipid Panel     Component Value Date/Time   CHOL 129 08/18/2018 0453   TRIG 131 08/18/2018 0453   HDL 19 (L) 08/18/2018 0453   CHOLHDL 6.8 08/18/2018 0453   VLDL 26 08/18/2018 0453   LDLCALC 84 08/18/2018 0453   -Hemoglobin A1c 4.9  Imaging I have reviewed the images obtained:  CT-scan of the brain- moderate focal hypodensity at the right parasagittal frontal parietal cortex, consistent with infarct of uncertain age, possible subacute.  No hemorrhage.  Age indeterminant cortical infarct with in the left posterior parietal lobe.  MRI examination of the brain-Scattered acute infarctions  affecting the left cerebellum and both cerebral hemispheres. The pattern is most consistent with embolic disease from the heart or ascending aorta. Differential diagnosis does include vasculitis. Old infarction in the left cingulate gyrus. Infarctions are most prominent in the right anterior cerebral artery territory, the right posterior cerebral artery territory in the left middle cerebral artery territory. Areas of acute infarction show mild swelling but there is no mass effect or shift. Small amount of petechial blood products in the right medial parietal region.  Carotid duplex- bilateral ICAs 1-39%  Echo- left ventricular EF shows 60-65%.,  Saline bubble study was negative, with no evidence of any inter-arterial shunt.  No valve vegetation.  Aortic root is normal in size and structure  Felicie Morn PA-C [facility] [phone]  M-F  (9:00 am- 5:00 PM)  08/19/2018, 10:29 AM     Assessment:  This is an unfortunate 73 year old male who has had multiple problems including malnutrition, eczema, at one point bedbound, now with bilateral hemispheric strokes affecting his left side and confusion per daughter and exam.  Images appear to show embolic etiology for strokes.  At this point in time there is question of possible malignancy which is being worked up by primary team.  In addition I do question if he has a DVT in his left leg which has been explained to primary team as he is pitting edema in left leg but not the right however this also could be due to patient not using his left leg.   Impression: Acute Ischemic Stroke  -Weight loss -Possible malignancy-which could cause patient to be hypercoagulable - Bilateral hemispheric strokes -Left lobar pneumonia -Hyponatremia Recommendations: - Agree with primary team to continue to evaluate for possible malignancy due to weight loss -Agree with high-dose statin and aspirin -- Holter moniter --CTA head and  neck--ordered -PT/OT --Lipid profile and AIC -- Echo complete  NEUROHOSPITALIST ADDENDUM Performed a face to face diagnostic evaluation.   I have reviewed the contents of history and physical exam as documented by PA/ARNP/Resident and agree with above documentation.  I have discussed and formulated the above plan as documented. Edits to the note  have been made as needed.  Neurology consulted after MRI brain performed for altered mental status and left-sided weakness showed multiple embolic strokes. Exam is limited due to language barrier, translator that speaks patient's language not available on telecom. Patient was able to state his name, mimic commands.  Has left upper extremity and lower extremity weakness.   Reduced blink to threat over the left eye.  Etiology of infarcts likely cardioembolic versus hypercoagulable state.  Echo showed normal EF, no PFO.   Recommend CT angiogram, Aspirin and statin.  Stroke team will continue to follow.    Georgiana Spinner Shadasia Oldfield MD [facility] [phone]   If 7pm to 7am, please call on call as listed on AMION.

## 2018-08-19 NOTE — Progress Notes (Signed)
Notified by nursing that patient went into rapid a fib.  This is new.  Will give IV cardizem and then start PO medications.  Hesitant to start IV heparin until after seen by neurology (new CVAs).  Check TSH. Marlin Canary DO

## 2018-08-19 NOTE — Progress Notes (Signed)
Notified NP Vann about patient converting to A-Fib with HR sustaining in the 130's-150's. Nurse will continue to monitor.

## 2018-08-19 NOTE — Progress Notes (Addendum)
Progress Note    Nathan Bruce  ZOX:[id number] DOB: [birth year]  DOA: 08/17/2018 PCP: Shade Flood, MD    Brief Narrative:     Medical records reviewed and are as summarized below:  Nathan Bruce is an 73 y.o. male admitted with progressive weakness and confusion.  Found to have CVA and LLPNA.  He also has left leg swelling.  Per chart review has had progressive weight loss of > 20 lbs in last 3 months.  Also developed severe "eczema" on BL LE in 2019-- biopsy done by dermatology in 2019-- ? Mite vs eczema on pathology  Assessment/Plan:   Principal Problem:   Acute CVA (cerebrovascular accident) (HCC) Active Problems:   Community acquired pneumonia   Anemia   HTN (hypertension)   Hyperlipidemia  acute CVA:  -MRI: Scattered acute infarctions affecting the left cerebellum and both cerebral hemispheres. The pattern is most consistent with embolic disease from the heart or ascending aorta. Differential diagnosis does include vasculitis. Old infarction in the left cingulate gyrus. Infarctions are most prominent in the right anterior cerebral artery territory, the right posterior cerebral artery territory in the left middle cerebral artery territory. Areas of acute infarction show mild swelling but there is no mass effect or shift. Small amount of petechial blood products in the right medial parietal region. -echo EF 60-65% -carotid: <1-39% -HgbA1c: 4.9 -LDL: 84 -PT/OT- SNF -neurology consult placed  Left leg swelling -duplex LE  Weight loss/elevated AST/ALT -had been seen by GI (LB) and had an appointment with rheumatology ( Dr. Dierdre Forth at [facility] on 08-21-18 at 1:30pm) -ANA negative -CCP + -EGD/colonscopy was postponed due to intolerance of prep  Anemia -appears Fe def per old labs -outpatient work up in progress   moderately severe protein calorie malnutrition:  -? Malignancy-- needs further work up   left lower lobe pneumonia:  -Appeared to  be community-acquired -IV abx  Hyponatremia -improved with IVF  Family Communication/Anticipated D/C date and plan/Code Status   DVT prophylaxis: Lovenox ordered. Code Status: Full Code.  Family Communication: daughter who is well versed in his medical history Disposition Plan: eventual SNF?   Medical Consultants:    Neuro      Subjective:   No overnight events  Objective:    Vitals:   08/18/18 1942 08/18/18 2333 08/19/18 0408 08/19/18 1125  BP: (!) 119/56 (!) 113/57 (!) 146/58 (!) 131/59  Pulse: 89 99 85 88  Resp: 18 18 18 15   Temp: 97.6 F (36.4 C) 98.2 F (36.8 C) (!) 97.5 F (36.4 C) 97.9 F (36.6 C)  TempSrc: Oral Oral Oral Oral  SpO2: 97% 98% 100% 100%  Weight:      Height:        Intake/Output Summary (Last 24 hours) at 08/19/2018 1155 Last data filed at 08/19/2018 0426 Gross per 24 hour  Intake 1255 ml  Output --  Net 1255 ml   Filed Weights   08/17/18 2223 08/18/18 0251  Weight: 54.4 kg 49.9 kg    Exam: Alert and awake rrr No increased work of breathing, no wheezing +BS, soft, NT Left LE edema pitting edema   Data Reviewed:   I have personally reviewed following labs and imaging studies:  Labs: Labs show the following:   Basic Metabolic Panel: Recent Labs  Lab 08/17/18 2234 08/18/18 0453 08/18/18 1055 08/19/18 0532  NA 128*  --  134* 134*  K 3.9  --  3.8 4.5  CL 99  --  101  103  CO2 24  --  23 21*  GLUCOSE 201*  --  113* 94  BUN 11  --  8 10  CREATININE 0.70 0.69 0.63 0.72  CALCIUM 7.9*  --  7.7* 7.6*   GFR Estimated Creatinine Clearance: 57.2 mL/min (by C-G formula based on SCr of 0.72 mg/dL). Liver Function Tests: Recent Labs  Lab 08/17/18 2234  AST 132*  ALT 88*  ALKPHOS 47  BILITOT 0.6  PROT 5.9*  ALBUMIN 2.0*   No results for input(s): LIPASE, AMYLASE in the last 168 hours. No results for input(s): AMMONIA in the last 168 hours. Coagulation profile No results for input(s): INR, PROTIME in the last 168  hours.  CBC: Recent Labs  Lab 08/17/18 2234 08/18/18 0453 08/18/18 1055 08/19/18 0532  WBC 9.8 10.0  --  8.3  HGB 9.0* 7.8* 8.2* 8.7*  HCT 27.9* 23.8* 25.5* 26.7*  MCV 72.3* 69.8*  --  70.8*  PLT 430* 475*  --  418*   Cardiac Enzymes: No results for input(s): CKTOTAL, CKMB, CKMBINDEX, TROPONINI in the last 168 hours. BNP (last 3 results) No results for input(s): PROBNP in the last 8760 hours. CBG: No results for input(s): GLUCAP in the last 168 hours. D-Dimer: No results for input(s): DDIMER in the last 72 hours. Hgb A1c: Recent Labs    08/18/18 0453  HGBA1C 4.9   Lipid Profile: Recent Labs    08/18/18 0453  CHOL 129  HDL 19*  LDLCALC 84  TRIG 161  CHOLHDL 6.8   Thyroid function studies: No results for input(s): TSH, T4TOTAL, T3FREE, THYROIDAB in the last 72 hours.  Invalid input(s): FREET3 Anemia work up: No results for input(s): VITAMINB12, FOLATE, FERRITIN, TIBC, IRON, RETICCTPCT in the last 72 hours. Sepsis Labs: Recent Labs  Lab 08/17/18 2234 08/18/18 0453 08/19/18 0532  WBC 9.8 10.0 8.3    Microbiology Recent Results (from the past 240 hour(s))  SARS Coronavirus 2 [facility] order, Performed in [facility] hospital lab)     Status: None   Collection Time: 08/18/18 12:01 AM  Result Value Ref Range Status   SARS Coronavirus 2 NEGATIVE NEGATIVE Final    Comment: (NOTE) If result is NEGATIVE SARS-CoV-2 target nucleic acids are NOT DETECTED. The SARS-CoV-2 RNA is generally detectable in upper and lower  respiratory specimens during the acute phase of infection. The lowest  concentration of SARS-CoV-2 viral copies this assay can detect is 250  copies / mL. A negative result does not preclude SARS-CoV-2 infection  and should not be used as the sole basis for treatment or other  patient management decisions.  A negative result may occur with  improper specimen collection / handling, submission of specimen other  than nasopharyngeal swab, presence of  viral mutation(s) within the  areas targeted by this assay, and inadequate number of viral copies  (<250 copies / mL). A negative result must be combined with clinical  observations, patient history, and epidemiological information. If result is POSITIVE SARS-CoV-2 target nucleic acids are DETECTED. The SARS-CoV-2 RNA is generally detectable in upper and lower  respiratory specimens dur ing the acute phase of infection.  Positive  results are indicative of active infection with SARS-CoV-2.  Clinical  correlation with patient history and other diagnostic information is  necessary to determine patient infection status.  Positive results do  not rule out bacterial infection or co-infection with other viruses. If result is PRESUMPTIVE POSTIVE SARS-CoV-2 nucleic acids MAY BE PRESENT.   A presumptive positive result was  obtained on the submitted specimen  and confirmed on repeat testing.  While 2019 novel coronavirus  (SARS-CoV-2) nucleic acids may be present in the submitted sample  additional confirmatory testing may be necessary for epidemiological  and / or clinical management purposes  to differentiate between  SARS-CoV-2 and other Sarbecovirus currently known to infect humans.  If clinically indicated additional testing with an alternate test  methodology [phone]([id number]) is advised. The SARS-CoV-2 RNA is generally  detectable in upper and lower respiratory sp ecimens during the acute  phase of infection. The expected result is Negative. Fact Sheet for Patients:  BoilerBrush.com.cyhttps://www.fda.gov/media/136312/download Fact Sheet for Healthcare Providers: https://pope.com/https://www.fda.gov/media/136313/download This test is not yet approved or cleared by the [place] FDA and has been authorized for detection and/or diagnosis of SARS-CoV-2 by FDA under an Emergency Use Authorization (EUA).  This EUA will remain in effect (meaning this test can be used) for the duration of the COVID-19 declaration under Section  564(b)(1) of the Act, 21 U.S.C. section 360bbb-3(b)(1), unless the authorization is terminated or revoked sooner. Performed at [facility], [address] [place], [place] [address]     Procedures and diagnostic studies:  Ct Head Wo Contrast  Result Date: 08/17/2018 CLINICAL DATA:  Left-sided weakness with confusion EXAM: CT HEAD WITHOUT CONTRAST TECHNIQUE: Contiguous axial images were obtained from the base of the skull through the vertex without intravenous contrast. COMPARISON:  None. FINDINGS: Brain: No hemorrhage or intracranial mass. Encephalomalacia involving the left greater than right frontal lobe. Hypodensity within the right parasagittal frontal parietal vertex. Age indeterminate hypodensity within the left posterior parietal lobe. Mild atrophy. Small vessel ischemic changes of the white matter. Mildly prominent ventricles felt secondary to atrophy. Vascular: No hyperdense vessels.  Carotid vascular calcification Skull: No fracture Sinuses/Orbits: No acute finding. Other: None IMPRESSION: 1. 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 No hemorrhage. Age indeterminate cortical infarct within the left posterior parietal lobe. 2. Atrophy and small vessel ischemic changes of the white matter. Encephalomalacia within the left greater than right frontal lobes. Electronically Signed   By: Jasmine PangKim  Fujinaga M.D.   On: 08/17/2018 22:54   Mr Brain Wo Contrast  Result Date: 08/18/2018 CLINICAL DATA:  Left-sided weakness and altered mental status. EXAM: MRI HEAD WITHOUT CONTRAST TECHNIQUE: Multiplanar, multiecho pulse sequences of the brain and surrounding structures were obtained without intravenous contrast. COMPARISON:  Head CT 08/17/2018 FINDINGS: Brain: Multiple regions acute/subacute infarction are present consistent with either embolic disease or vasculitis. There are a cluster of small vessel  infarctions in the left cerebellum. Right cerebral hemisphere shows extensive region of infarctions in the right anterior cerebral artery territory and in the right posterior cerebral artery territory. Relative sparing of the right middle cerebral artery territory with only a small acute infarction in the right caudate head. In the left hemisphere, there is acute infarction in the left posterior parietal lobe consistent with an MCA branch vessel infarction. There is infarction at the left frontoparietal vertex that is probably MCA territory but could to some degree be ACA territory. Areas of infarction show mild swelling. There are very minor blood products in the right medial parietal infarction. No evidence of tumor patient does have old infarction in the left ACA territory affecting the cingulate gyrus region. Old lacunar infarctions in the basal ganglia and in the right thalamus. No hydrocephalus. No extra-axial collection. Vascular: Major vessels at the base of the brain show flow. Skull  and upper cervical spine: Negative Sinuses/Orbits: Clear/normal Other: None IMPRESSION: Scattered acute infarctions affecting the left cerebellum and both cerebral hemispheres. The pattern is most consistent with embolic disease from the heart or ascending aorta. Differential diagnosis does include vasculitis. Old infarction in the left cingulate gyrus. Infarctions are most prominent in the right anterior cerebral artery territory, the right posterior cerebral artery territory in the left middle cerebral artery territory. Areas of acute infarction show mild swelling but there is no mass effect or shift. Small amount of petechial blood products in the right medial parietal region. Electronically Signed   By: Paulina Fusi M.D.   On: 08/18/2018 16:41   Dg Chest Port 1 View  Result Date: 08/17/2018 CLINICAL DATA:  Weakness EXAM: PORTABLE CHEST 1 VIEW COMPARISON:  06/17/2018 FINDINGS: Consolidation in the left lower lobe. No  large effusion. Borderline cardiomegaly with aortic atherosclerosis. No pneumothorax. IMPRESSION: Left lower lobe consolidation concerning for a pneumonia. Radiographic follow-up to resolution is advised. Electronically Signed   By: Jasmine Pang M.D.   On: 08/17/2018 23:02   Vas US Carotid (at [facility] Only)  Result Date: 08/18/2018 Carotid Arterial Duplex Study Indications: CVA. Performing Technologist: Toma Deiters RVS  Examination Guidelines: A complete evaluation includes B-mode imaging, spectral Doppler, color Doppler, and power Doppler as needed of all accessible portions of each vessel. Bilateral testing is considered an integral part of a complete examination. Limited examinations for reoccurring indications may be performed as noted.  Right Carotid Findings: +----------+--------+--------+--------+---------------------+------------------+             PSV cm/s EDV cm/s Stenosis Describe              Comments            +----------+--------+--------+--------+---------------------+------------------+  CCA Prox   121      16                                      mild intimal                                                                     changes             +----------+--------+--------+--------+---------------------+------------------+  CCA Distal 100      10                                      mild intimal                                                                     changes             +----------+--------+--------+--------+---------------------+------------------+  ICA Prox   97       10       1-39%    diffuse and  mild plaque                                                heterogenous                              +----------+--------+--------+--------+---------------------+------------------+  ICA Mid    103      22                                                          +----------+--------+--------+--------+---------------------+------------------+  ICA Distal 113      26                                                           +----------+--------+--------+--------+---------------------+------------------+  ECA        91       7                 heterogenous          mild plaque         +----------+--------+--------+--------+---------------------+------------------+ +----------+--------+-------+--------+-------------------+             PSV cm/s EDV cms Describe Arm Pressure (mmHG)  +----------+--------+-------+--------+-------------------+  Subclavian 167                                            +----------+--------+-------+--------+-------------------+ +---------+--------+---+--------+--+  Vertebral PSV cm/s 150 EDV cm/s 14  +---------+--------+---+--------+--+  Left Carotid Findings: +----------+--------+--------+--------+---------------------+------------------+             PSV cm/s EDV cm/s Stenosis Describe              Comments            +----------+--------+--------+--------+---------------------+------------------+  CCA Prox   133      17                                      mild intimal                                                                     changes             +----------+--------+--------+--------+---------------------+------------------+  CCA Distal 97       7                                       mild intimal  changes             +----------+--------+--------+--------+---------------------+------------------+  ICA Prox   118      18       1-39%    heterogenous and      mild plaque                                                diffuse                                   +----------+--------+--------+--------+---------------------+------------------+  ICA Mid    94       24                                                          +----------+--------+--------+--------+---------------------+------------------+  ICA Distal 104      26                                                           +----------+--------+--------+--------+---------------------+------------------+  ECA        95       4                 heterogenous          mild plaque         +----------+--------+--------+--------+---------------------+------------------+ +----------+--------+--------+--------+-------------------+  Subclavian PSV cm/s EDV cm/s Describe Arm Pressure (mmHG)  +----------+--------+--------+--------+-------------------+             181                                             +----------+--------+--------+--------+-------------------+ +---------+--------+---+--------+--+  Vertebral PSV cm/s 113 EDV cm/s 10  +---------+--------+---+--------+--+  Summary: Right Carotid: Velocities in the right ICA are consistent with a 1-39% stenosis. Left Carotid: Velocities in the left ICA are consistent with a 1-39% stenosis. Vertebrals:  Bilateral vertebral arteries demonstrate antegrade flow. Subclavians: Normal flow hemodynamics were seen in bilateral subclavian              arteries. *See table(s) above for measurements and observations.     Preliminary     Medications:    aspirin  325 mg Oral Daily   atorvastatin  40 mg Oral q1800   doxepin  25 mg Oral QHS   enoxaparin (LOVENOX) injection  30 mg Subcutaneous Q24H   furosemide  20 mg Oral Daily   multivitamin with minerals  1 tablet Oral Daily   pantoprazole  40 mg Oral Daily   vitamin B-12  500 mcg Oral Daily   Continuous Infusions:  sodium chloride 50 mL/hr at 08/18/18 1326   azithromycin 250 mL/hr at 08/19/18 0001   cefTRIAXone (ROCEPHIN)  IV Stopped (08/19/18 0000)     LOS: 1 day   Nathan Bruce  [facility]   How to contact  the [facility] Attending or Consulting provider 7A - 7P or covering provider during after hours 7P -7A, for this patient?  1. Check the care team in [facility] and look for a) attending/consulting TRH provider listed and b) the [facility] team listed 2. Log into [email or web address] and use [place]'s universal password to  access. If you do not have the password, please contact the hospital operator. 3. Locate the [facility] provider you are looking for under [facility] and page to a number that you can be directly reached. 4. If you still have difficulty reaching the provider, please page the [facility] (Director on Call) for the Hospitalists listed on amion for assistance.  08/19/2018, 11:55 AM

## 2018-08-19 NOTE — Progress Notes (Signed)
SLP Cancellation Note  Patient Details Name: Nathan Bruce MRN: [id number] DOB: November 04, 1945   Cancelled treatment:       Reason Eval/Treat Not Completed: Other (comment) Orders received for swallow evaluation. Per MD, will hold evaluation for now. Of note, pt has passed [facility] swallow screen and is currently on a diet. Will f/u for evals as able.   Nathan Bruce 08/19/2018, 8:15 AM  Nathan Bruce, M.A. CCC-SLP Acute Herbalist [phone] Office [phone]

## 2018-08-19 NOTE — Progress Notes (Signed)
Left lower extremity venous duplex has been completed. Preliminary results can be found in CV Proc through chart review.   08/19/18 1:53 PM Olen Cordial RVT

## 2018-08-20 ENCOUNTER — Inpatient Hospital Stay (HOSPITAL_COMMUNITY): Payer: Medicare Other

## 2018-08-20 LAB — COMPREHENSIVE METABOLIC PANEL
ALT: 72 U/L — ABNORMAL HIGH (ref 0–44)
AST: 143 U/L — ABNORMAL HIGH (ref 15–41)
Albumin: 1.8 g/dL — ABNORMAL LOW (ref 3.5–5.0)
Alkaline Phosphatase: 56 U/L (ref 38–126)
Anion gap: 11 (ref 5–15)
BUN: 13 mg/dL (ref 8–23)
CO2: 20 mmol/L — ABNORMAL LOW (ref 22–32)
Calcium: 7.7 mg/dL — ABNORMAL LOW (ref 8.9–10.3)
Chloride: 103 mmol/L (ref 98–111)
Creatinine, Ser: 0.72 mg/dL (ref 0.61–1.24)
GFR calc Af Amer: >60 mL/min (ref >60)
GFR calc non Af Amer: >60 mL/min (ref >60)
Glucose, Bld: 99 mg/dL (ref 70–99)
Potassium: 4.5 mmol/L (ref 3.5–5.1)
Sodium: 134 mmol/L — ABNORMAL LOW (ref 135–145)
Total Bilirubin: 1 mg/dL (ref 0.3–1.2)
Total Protein: 5.5 g/dL — ABNORMAL LOW (ref 6.5–8.1)

## 2018-08-20 LAB — CBC
HCT: 31.6 % — ABNORMAL LOW (ref 39.0–52.0)
Hemoglobin: 9.9 g/dL — ABNORMAL LOW (ref 13.0–17.0)
MCH: 22.4 pg — ABNORMAL LOW (ref 26.0–34.0)
MCHC: 31.3 g/dL (ref 30.0–36.0)
MCV: 71.5 fL — ABNORMAL LOW (ref 80.0–100.0)
Platelets: 411 10*3/uL — ABNORMAL HIGH (ref 150–400)
RBC: 4.42 MIL/uL (ref 4.22–5.81)
RDW: 18 % — ABNORMAL HIGH (ref 11.5–15.5)
WBC: 8.9 10*3/uL (ref 4.0–10.5)
nRBC: 0 % (ref 0.0–0.2)

## 2018-08-20 MED ORDER — ENOXAPARIN SODIUM 40 MG/0.4ML ~~LOC~~ SOLN
40.0000 mg | SUBCUTANEOUS | Status: DC
  Administered 2018-08-21: 40 mg via SUBCUTANEOUS
  Filled 2018-08-20: qty 0.4

## 2018-08-20 NOTE — TOC Initial Note (Signed)
Transition of Care [facility]) - Initial/Assessment Note    Patient Details  Name: Nathan Bruce MRN: [id number] Date of Birth: 22-Sep-1945  [facility] CM/SW Contact:    Baldemar Lenis, LCSW Phone Number: 08/20/2018, 3:31 PM  Clinical Narrative:  CSW spoke with patient's daughter, Hedy Jacob, over the phone to discuss discharge plan. CSW presented recommendation for SNF, daughter in agreement. Daughter hopeful that the patient will get better to come home. CSW explained limited facility options in this time, and daughter indicated understanding. Provided permission to fax out referral and follow up with bed offers.                 Expected Discharge Plan: Skilled Nursing Facility Barriers to Discharge: Continued Medical Work up   Patient Goals and CMS Choice Patient states their goals for this hospitalization and ongoing recovery are:: patient unable to participate in goal setting; daughter wants the patient to get better CMS Medicare.gov Compare Post Acute Care list provided to:: Patient Represenative (must comment) Choice offered to / list presented to : Adult Children  Expected Discharge Plan and Services Expected Discharge Plan: Skilled Nursing Facility     Post Acute Care Choice: Skilled Nursing Facility Living arrangements for the past 2 months: Single Family Home                                      Prior Living Arrangements/Services Living arrangements for the past 2 months: Single Family Home Lives with:: Self, Adult Children          Need for Family Participation in Patient Care: Yes (Comment) Care giver support system in place?: No (comment)      Activities of Daily Living      Permission Sought/Granted                  Emotional Assessment   Attitude/Demeanor/Rapport: Unable to Assess Affect (typically observed): Unable to Assess Orientation: : Oriented to Self Alcohol / Substance Use: Not Applicable Psych Involvement: No  (comment)  Admission diagnosis:  Hyponatremia [E87.1] Microcytic anemia [D50.9] Moderate protein-calorie malnutrition (HCC) [E44.0] URI with cough and congestion [J06.9] Cerebrovascular accident (CVA), unspecified mechanism (HCC) [I63.9] Patient Active Problem List   Diagnosis Date Noted  . Acute CVA (cerebrovascular accident) (HCC) 08/18/2018  . Community acquired pneumonia 08/18/2018  . Anemia 08/18/2018  . HTN (hypertension) 08/18/2018  . Hyperlipidemia 08/18/2018   PCP:  Shade Flood, MD Pharmacy:   [facility] [phone] [address], [place] - [phone] AT [facility] [address] [place] [address] Phone: [phone] Fax: [phone]     Social Determinants of Health (SDOH) Interventions    Readmission Risk Interventions No flowsheet data found.

## 2018-08-20 NOTE — Progress Notes (Signed)
md paged for medication to calm patient

## 2018-08-20 NOTE — Progress Notes (Signed)
Modified Barium Swallow Progress Note  Patient Details  Name: Nathan Bruce MRN: [id number] Date of Birth: [birth year]  Today's Date: 08/20/2018  Modified Barium Swallow completed.  Full report located under Chart Review in the Imaging Section.  Brief recommendations include the following:  Clinical Impression  Patient presents with a primary structural dysphagia from cervical osterophytes that are pushing into pharynx and resulting in restricted transit of bolus through UES. Patient also with mild-mod oropharyngeal dysphagia with inabilty to orally transit barium tablet, swallow initiation delays to vallecular sinus. Instance of penetration (that fully cleared laryngeal vestibule) occured after the swallow and likely a result of buildup of pyriform and vallecular sinus residuals. Patient able to solid  residuals with sips of plain water (not barium) but likely continued with both vallecular and pyriform sinus residuals. Patient unable to perform chin tuck despite demonstration.    Swallow Evaluation Recommendations       SLP Diet Recommendations: Dysphagia 3 (Mech soft) solids;Thin liquid   Liquid Administration via: Cup;Straw   Medication Administration: Crushed with puree   Supervision: Full supervision/cueing for compensatory strategies;Staff to assist with self feeding   Compensations: Minimize environmental distractions;Slow rate;Small sips/bites;Follow solids with liquid   Postural Changes: Seated upright at 90 degrees;Remain semi-upright after after feeds/meals (Comment)   Oral Care Recommendations: Oral care BID        Pablo Lawrence 08/20/2018,6:08 PM   Angela Nevin, MA, CCC-SLP Speech Therapy [facility] Acute Rehab Pager: [phone]

## 2018-08-20 NOTE — Progress Notes (Addendum)
Progress Note    ATARI NOVICK  ZOX:[id number] DOB: August 11, 1945  DOA: 08/17/2018 PCP: Shade Flood, MD    Brief Narrative:     Medical records reviewed and are as summarized below:  Willian DAMAREE Bruce is an 73 y.o. male admitted with progressive weakness and confusion.  Found to have CVA and LLPNA.  He also has left leg swelling.  Per chart review has had progressive weight loss of > 20 lbs in last 3 months.  Also developed severe "eczema" on BL LE in 2019-- biopsy done by dermatology in 2019-- ? Mite vs eczema on pathology  Assessment/Plan:   Acute CVA:  -MRI: Scattered acute infarctions affecting the left cerebellum and both cerebral hemispheres. The pattern is most consistent with embolic disease from the heart or ascending aorta.  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 concerns for Afib, although no EKG was obtained during this time, will ask Cards to curbside and review tele and if confirms Afib will start Eliquis tomorrow, otherwise continue ASA, statin -appreciate Neuro input -echo EF 60-65% -carotid: <1-39% -HgbA1c: 4.9 -LDL: 84 -PT/OT- SNFrecommended, will plan for Home with HH due to baseline confusion and extreme language barrier   Weight loss/elevated AST/ALT -had been seen by GI (LB) and had an appointment with rheumatology ( Dr. Dierdre Forth at [facility] on 08-21-18 at 1:30pm) -ANA negative -CCP + -EGD/colonscopy was postponed due to intolerance of prep -FU with Rheum  Anemia -appears Fe def per old labs -outpatient work up in progress   moderately severe protein calorie malnutrition:  -? Malignancy-- needs further work up   left lower lobe pneumonia:  -Appeared to be community-acquired -Covid 19 PCR negative -continue ceftriaxone and azithromycin  Hyponatremia -improved with IVF  Family Communication/Anticipated D/C date and plan/Code Status   DVT prophylaxis: Lovenox ordered. Code Status: Full Code.  Family  Communication: daughter at bedside Disposition Plan: home with [facility] service  Medical Consultants:    Neuro      Subjective:   No overnight events  Objective:    Vitals:   08/19/18 1946 08/20/18 0027 08/20/18 0442 08/20/18 0726  BP: (!) 121/54 (!) 122/59 (!) 139/56 128/68  Pulse: 80 83 84 90  Resp: Temp: 98.3 F (36.8 C) 98 F (36.7 C) 98.2 F (36.8 C) 98 F (36.7 C)  TempSrc: Oral Oral Oral Axillary  SpO2: 90% 100% 100% 100%  Weight:      Height:        Intake/Output Summary (Last 24 hours) at 08/20/2018 1622 Last data filed at 08/19/2018 1740 Gross per 24 hour  Intake 240 ml  Output --  Net 240 ml   Filed Weights   08/17/18 2223 08/18/18 0251  Weight: 54.4 kg 49.9 kg    Exam: Gen: Awake, Alert, mumbling and groaning intermittently HEENT: PERRLA, Neck supple, no JVD Lungs: ronchi at R base CVS: S1S2/RRR Abd: soft, Non tender, non distended, BS present Extremities: No Cyanosis, Clubbing or edema Skin: diffuse eczematous rash all over arms and legs Neuro: poor effort, L hemiplegia  Data Reviewed:   I have personally reviewed following labs and imaging studies:  Labs: Labs show the following:   Basic Metabolic Panel: Recent Labs  Lab 08/17/18 2234 08/18/18 0453 08/18/18 1055 08/19/18 0532 08/20/18 0445  NA 128*  --  134* 134* 134*  K 3.9  --  3.8 4.5 4.5  CL 99  --  101 103 103  CO2 24  --  23 21* 20*  GLUCOSE 201*  --  113* 94 99  BUN 11  --  CREATININE 0.70 0.69 0.63 0.72 0.72  CALCIUM 7.9*  --  7.7* 7.6* 7.7*   GFR Estimated Creatinine Clearance: 57.2 mL/min (by C-G formula based on SCr of 0.72 mg/dL). Liver Function Tests: Recent Labs  Lab 08/17/18 2234 08/20/18 0445  AST 132* 143*  ALT 88* 72*  ALKPHOS 47 56  BILITOT 0.6 1.0  PROT 5.9* 5.5*  ALBUMIN 2.0* 1.8*   No results for input(s): LIPASE, AMYLASE in the last 168 hours. No results for input(s): AMMONIA in the last 168 hours. Coagulation  profile No results for input(s): INR, PROTIME in the last 168 hours.  CBC: Recent Labs  Lab 08/17/18 2234 08/18/18 0453 08/18/18 1055 08/19/18 0532 08/20/18 0940  WBC 9.8 10.0  --  8.3 8.9  HGB 9.0* 7.8* 8.2* 8.7* 9.9*  HCT 27.9* 23.8* 25.5* 26.7* 31.6*  MCV 72.3* 69.8*  --  70.8* 71.5*  PLT 430* 475*  --  418* 411*   Cardiac Enzymes: No results for input(s): CKTOTAL, CKMB, CKMBINDEX, TROPONINI in the last 168 hours. BNP (last 3 results) No results for input(s): PROBNP in the last 8760 hours. CBG: No results for input(s): GLUCAP in the last 168 hours. D-Dimer: No results for input(s): DDIMER in the last 72 hours. Hgb A1c: Recent Labs    08/18/18 0453  HGBA1C 4.9   Lipid Profile: Recent Labs    08/18/18 0453  CHOL 129  HDL 19*  LDLCALC 84  TRIG 960  CHOLHDL 6.8   Thyroid function studies: Recent Labs    08/19/18 1134  TSH 2.034   Anemia work up: No results for input(s): VITAMINB12, FOLATE, FERRITIN, TIBC, IRON, RETICCTPCT in the last 72 hours. Sepsis Labs: Recent Labs  Lab 08/17/18 2234 08/18/18 0453 08/19/18 0532 08/20/18 0940  WBC 9.8 10.0 8.3 8.9    Microbiology Recent Results (from the past 240 hour(s))  SARS Coronavirus 2 [facility] order, Performed in [facility] hospital lab)     Status: None   Collection Time: 08/18/18 12:01 AM  Result Value Ref Range Status   SARS Coronavirus 2 NEGATIVE NEGATIVE Final    Comment: (NOTE) If result is NEGATIVE SARS-CoV-2 target nucleic acids are NOT DETECTED. The SARS-CoV-2 RNA is generally detectable in upper and lower  respiratory specimens during the acute phase of infection. The lowest  concentration of SARS-CoV-2 viral copies this assay can detect is 250  copies / mL. A negative result does not preclude SARS-CoV-2 infection  and should not be used as the sole basis for treatment or other  patient management decisions.  A negative result may occur with  improper specimen collection / handling,  submission of specimen other  than nasopharyngeal swab, presence of viral mutation(s) within the  areas targeted by this assay, and inadequate number of viral copies  (<250 copies / mL). A negative result must be combined with clinical  observations, patient history, and epidemiological information. If result is POSITIVE SARS-CoV-2 target nucleic acids are DETECTED. The SARS-CoV-2 RNA is generally detectable in upper and lower  respiratory specimens dur ing the acute phase of infection.  Positive  results are indicative of active infection with SARS-CoV-2.  Clinical  correlation with patient history and other diagnostic information is  necessary to determine patient infection status.  Positive results do  not rule out bacterial infection or co-infection with other viruses. If result is PRESUMPTIVE POSTIVE SARS-CoV-2 nucleic  acids MAY BE PRESENT.   A presumptive positive result was obtained on the submitted specimen  and confirmed on repeat testing.  While 2019 novel coronavirus  (SARS-CoV-2) nucleic acids may be present in the submitted sample  additional confirmatory testing may be necessary for epidemiological  and / or clinical management purposes  to differentiate between  SARS-CoV-2 and other Sarbecovirus currently known to infect humans.  If clinically indicated additional testing with an alternate test  methodology [phone]) is advised. The SARS-CoV-2 RNA is generally  detectable in upper and lower respiratory sp ecimens during the acute  phase of infection. The expected result is Negative. Fact Sheet for Patients:  BoilerBrush.com.cy Fact Sheet for Healthcare Providers: https://pope.com/ This test is not yet approved or cleared by the [place] FDA and has been authorized for detection and/or diagnosis of SARS-CoV-2 by FDA under an Emergency Use Authorization (EUA).  This EUA will remain in effect (meaning this test can be  used) for the duration of the COVID-19 declaration under Section 564(b)(1) of the Act, 21 U.S.C. section 360bbb-3(b)(1), unless the authorization is terminated or revoked sooner. Performed at [facility], [address] [place], [place] [address]     Procedures and diagnostic studies:  Ct Angio Head W Or Wo Contrast  Result Date: 08/19/2018 CLINICAL DATA:  Stroke EXAM: CT ANGIOGRAPHY HEAD AND NECK TECHNIQUE: Multidetector CT imaging of the head and neck was performed using the standard protocol during bolus administration of intravenous contrast. Multiplanar CT image reconstructions and MIPs were obtained to evaluate the vascular anatomy. Carotid stenosis measurements (when applicable) are obtained utilizing NASCET criteria, using the distal internal carotid diameter as the denominator. CONTRAST:  OMNIPAQUE IOHEXOL 350 MG/ML SOLN COMPARISON:  MRI head 08/18/2018 FINDINGS: CT HEAD FINDINGS Brain: Hypodensity in the right medial frontal parietal lobe compatible with acute ACA infarct. Chronic left ACA infarct. Small area of acute infarction left frontal parietal lobe as noted on MRI. Acute infarct right medial occipital lobe as noted on MRI. Small acute infarct left cerebellum best seen on MRI. Small acute infarct left cerebellum. No acute hemorrhage or mass. Generalized atrophy without midline shift Vascular: Atherosclerotic calcification. Negative for hyperdense vessel Skull: Negative Sinuses: Negative Orbits: Negative Review of the MIP images confirms the above findings CTA NECK FINDINGS Aortic arch: Mild atherosclerotic disease aortic arch without aneurysm or dissection. Proximal great vessels widely patent. Right carotid system: Mild atherosclerotic calcification right carotid bifurcation without significant stenosis Left carotid system: Mild atherosclerotic calcification left carotid bifurcation without stenosis Vertebral arteries: Both vertebral arteries patent to the basilar. Mild  stenosis origin of right vertebral artery. Skeleton: Cervical spondylosis.  No acute skeletal abnormality. Other neck: Subcentimeter left thyroid nodule. No mass or adenopathy in the neck. Upper chest: Small bilateral pleural effusions. No infiltrate or mass in the upper lobes. Review of the MIP images confirms the above findings CTA HEAD FINDINGS Anterior circulation: Mild atherosclerotic disease cavernous carotid bilaterally without stenosis. Severe stenosis right A2 segment corresponding to distal right ACA infarct. Left ACA patent. Middle cerebral artery patent bilaterally without stenosis or branch occlusion. Posterior circulation: Both vertebral arteries patent to the basilar. Mild stenosis distal vertebral bilaterally. PICA patent bilaterally. Basilar widely patent. Superior cerebellar artery patent bilaterally. Hypoplastic distal basilar due to fetal origin of the posterior cerebral artery bilaterally. Fetal origin left posterior cerebral artery which is small but patent. Fetal origin right posterior cerebral artery which is small and severely diseased. Venous sinuses: Patent Anatomic variants: None Delayed phase:  Normal enhancement postcontrast infusion. Review of the MIP images confirms the above findings IMPRESSION: 1. Acute/subacute infarcts in the right ACA territory, right occipital lobe, left parietal lobe, and left cerebellum as noted on MRI yesterday. No acute hemorrhage. 2. Mild atherosclerotic disease in the carotid bifurcation bilaterally. Mild vertebral stenosis bilaterally. No significant carotid or vertebral artery stenosis in the neck. 3. Severe disease in the right A2 segment accounting for right ACA infarct. 4. Severe disease right posterior cerebral artery accounting for right occipital infarct. Electronically Signed   By: Marlan Palauharles  Clark M.D.   On: 08/19/2018 15:09   Ct Angio Neck W Or Wo Contrast  Result Date: 08/19/2018 CLINICAL DATA:  Stroke EXAM: CT ANGIOGRAPHY HEAD AND NECK  TECHNIQUE: Multidetector CT imaging of the head and neck was performed using the standard protocol during bolus administration of intravenous contrast. Multiplanar CT image reconstructions and MIPs were obtained to evaluate the vascular anatomy. Carotid stenosis measurements (when applicable) are obtained utilizing NASCET criteria, using the distal internal carotid diameter as the denominator. CONTRAST:  100mL OMNIPAQUE IOHEXOL 350 MG/ML SOLN COMPARISON:  MRI head 08/18/2018 FINDINGS: CT HEAD FINDINGS Brain: Hypodensity in the right medial frontal parietal lobe compatible with acute ACA infarct. Chronic left ACA infarct. Small area of acute infarction left frontal parietal lobe as noted on MRI. Acute infarct right medial occipital lobe as noted on MRI. Small acute infarct left cerebellum best seen on MRI. Small acute infarct left cerebellum. No acute hemorrhage or mass. Generalized atrophy without midline shift Vascular: Atherosclerotic calcification. Negative for hyperdense vessel Skull: Negative Sinuses: Negative Orbits: Negative Review of the MIP images confirms the above findings CTA NECK FINDINGS Aortic arch: Mild atherosclerotic disease aortic arch without aneurysm or dissection. Proximal great vessels widely patent. Right carotid system: Mild atherosclerotic calcification right carotid bifurcation without significant stenosis Left carotid system: Mild atherosclerotic calcification left carotid bifurcation without stenosis Vertebral arteries: Both vertebral arteries patent to the basilar. Mild stenosis origin of right vertebral artery. Skeleton: Cervical spondylosis.  No acute skeletal abnormality. Other neck: Subcentimeter left thyroid nodule. No mass or adenopathy in the neck. Upper chest: Small bilateral pleural effusions. No infiltrate or mass in the upper lobes. Review of the MIP images confirms the above findings CTA HEAD FINDINGS Anterior circulation: Mild atherosclerotic disease cavernous carotid  bilaterally without stenosis. Severe stenosis right A2 segment corresponding to distal right ACA infarct. Left ACA patent. Middle cerebral artery patent bilaterally without stenosis or branch occlusion. Posterior circulation: Both vertebral arteries patent to the basilar. Mild stenosis distal vertebral bilaterally. PICA patent bilaterally. Basilar widely patent. Superior cerebellar artery patent bilaterally. Hypoplastic distal basilar due to fetal origin of the posterior cerebral artery bilaterally. Fetal origin left posterior cerebral artery which is small but patent. Fetal origin right posterior cerebral artery which is small and severely diseased. Venous sinuses: Patent Anatomic variants: None Delayed phase: Normal enhancement postcontrast infusion. Review of the MIP images confirms the above findings IMPRESSION: 1. Acute/subacute infarcts in the right ACA territory, right occipital lobe, left parietal lobe, and left cerebellum as noted on MRI yesterday. No acute hemorrhage. 2. Mild atherosclerotic disease in the carotid bifurcation bilaterally. Mild vertebral stenosis bilaterally. No significant carotid or vertebral artery stenosis in the neck. 3. Severe disease in the right A2 segment accounting for right ACA infarct. 4. Severe disease right posterior cerebral artery accounting for right occipital infarct. Electronically Signed   By: Marlan Palauharles  Clark M.D.   On: 08/19/2018 15:09   Vas [place] Lower Extremity Venous (dvt)  Result Date: 08/19/2018  Lower Venous Study Indications: Swelling.  Performing Technologist: Chanda Busing RVT  Examination Guidelines: A complete evaluation includes B-mode imaging, spectral Doppler, color Doppler, and power Doppler as needed of all accessible portions of each vessel. Bilateral testing is considered an integral part of a complete examination. Limited examinations for reoccurring indications may be performed as noted.   +-----+---------------+---------+-----------+----------+-------+  RIGHT Compressibility Phasicity Spontaneity Properties Summary  +-----+---------------+---------+-----------+----------+-------+  CFV   Full            Yes       Yes                             +-----+---------------+---------+-----------+----------+-------+   +---------+---------------+---------+-----------+----------+-------+  LEFT      Compressibility Phasicity Spontaneity Properties Summary  +---------+---------------+---------+-----------+----------+-------+  CFV       Full            Yes       Yes                             +---------+---------------+---------+-----------+----------+-------+  SFJ       Full                                                      +---------+---------------+---------+-----------+----------+-------+  FV Prox   Full                                                      +---------+---------------+---------+-----------+----------+-------+  FV Mid    Full                                                      +---------+---------------+---------+-----------+----------+-------+  FV Distal Full                                                      +---------+---------------+---------+-----------+----------+-------+  PFV       Full                                                      +---------+---------------+---------+-----------+----------+-------+  POP       Full            Yes       Yes                             +---------+---------------+---------+-----------+----------+-------+  PTV       Full                                                      +---------+---------------+---------+-----------+----------+-------+  PERO      Full                                                      +---------+---------------+---------+-----------+----------+-------+     Summary: Right: No evidence of common femoral vein obstruction. Left: There is no evidence of deep vein thrombosis in the lower extremity. No cystic structure found in  the popliteal fossa.  *See table(s) above for measurements and observations. Electronically signed by Coral Else MD on 08/19/2018 at 5:12:58 PM.    Final     Medications:    aspirin  325 mg Oral Daily   atorvastatin  40 mg Oral q1800   doxepin  25 mg Oral QHS   [START ON 08/21/2018] enoxaparin (LOVENOX) injection  40 mg Subcutaneous Q24H   furosemide  20 mg Oral Daily   metoprolol tartrate  12.5 mg Oral BID   multivitamin with minerals  1 tablet Oral Daily   pantoprazole  40 mg Oral Daily   vitamin B-12  500 mcg Oral Daily   Continuous Infusions:  sodium chloride 50 mL/hr at 08/20/18 0651   azithromycin 500 mg (08/19/18 2240)   cefTRIAXone (ROCEPHIN)  IV 1 g (08/19/18 2133)     LOS: 2 days   Zannie Cove  [facility]   08/20/2018, 4:22 PM

## 2018-08-20 NOTE — Evaluation (Signed)
Clinical/Bedside Swallow Evaluation Patient Details  Name: Nathan Bruce MRN: [id number] Date of Birth: 1945/12/25  Today's Date: 08/20/2018 Time: SLP Start Time (ACUTE ONLY): 1145 SLP Stop Time (ACUTE ONLY): 1215 SLP Time Calculation (min) (ACUTE ONLY): 30 min  Past Medical History:  Past Medical History:  Diagnosis Date  . Arthritis    knees  . Food allergy    Shellfish allergy  . Hypercholesterolemia   . Hypertension    Past Surgical History:  Past Surgical History:  Procedure Laterality Date  . APPENDECTOMY    . CATARACT EXTRACTION N/A   . I&D EXTREMITY Left 08/22/2013   Procedure: IRRIGATION AND DEBRIDEMENT EXTREMITY;  Surgeon: Tami RibasKevin R Kuzma, MD;  [facility];  Service: Orthopedics;  Laterality: Left;  left ring finger  . LUMBAR DISC SURGERY  2011   [place]   . PERCUTANEOUS PINNING Left 08/22/2013   Procedure: PERCUTANEOUS PINNING EXTREMITY;  Surgeon: Tami RibasKevin R Kuzma, MD;  [facility];  Service: Orthopedics;  Laterality: Left;  left ring finger  . TOOTH EXTRACTION     HPI:  Patient is a 73 y/o male presenting to the ED on 08/17/2018 with primary complaints of L sided weakness and AMS. PMH significant of hypertension, hyperlipidemia, osteoarthritis. 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 Patient was screened for COVID-19 and was negative.  Chest x-ray also showed left lower lobe consolidation concerning for pneumonia.   Assessment / Plan / Recommendation Clinical Impression  Patient presents with a mild-mod oropharyngeal dysphagia characterized by mastication and oral transit delays, immediate and delayed coughing during regular solid PO's, multiple swallows with thin liquids. MBS warranted to r/o aspiration and determine safe PO diet.  SLP Visit Diagnosis: Dysphagia, unspecified (R13.10)    Aspiration Risk  Moderate aspiration risk    Diet Recommendation Other (Comment)(wait for MBS  results)   Medication Administration: Crushed with puree    Other  Recommendations Oral Care Recommendations: Oral care BID   Follow up Recommendations Other (comment)(TBD, likely family can manage as needed)      Frequency and Duration min 2x/week  1 week       Prognosis Prognosis for Safe Diet Advancement: Fair      Swallow Study   General Date of Onset: 08/18/18 HPI: Patient is a 73 y/o male presenting to the ED on 08/17/2018 with primary complaints of L sided weakness and AMS. PMH significant of hypertension, hyperlipidemia, osteoarthritis. 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 Patient was screened for COVID-19 and was negative.  Chest x-ray also showed left lower lobe consolidation concerning for pneumonia. Type of Study: Bedside Swallow Evaluation Previous Swallow Assessment: N/A Diet Prior to this Study: Regular;Thin liquids Temperature Spikes Noted: No Respiratory Status: Room air History of Recent Intubation: No Behavior/Cognition: Alert;Cooperative;Pleasant mood;Distractible;Requires cueing Oral Cavity Assessment: Within Functional Limits Oral Care Completed by SLP: Yes Oral Cavity - Dentition: Missing dentition Self-Feeding Abilities: Needs assist;Needs set up Patient Positioning: Upright in bed Baseline Vocal Quality: Normal Volitional Cough: Cognitively unable to elicit Volitional Swallow: Unable to elicit    Oral/Motor/Sensory Function Overall Oral Motor/Sensory Function: Other (comment)(difficult to fully assess secondary to patient not following all directions) Facial ROM: Within Functional Limits Facial Strength: Within Functional Limits Lingual ROM: Within Functional Limits   Ice Chips Ice chips: Not tested   Thin Liquid Thin Liquid: Impaired Presentation: Straw Pharyngeal  Phase Impairments: Multiple swallows;Suspected delayed  Swallow    Nectar Thick     Honey Thick      Puree Puree: Not tested   Solid     Solid: Impaired Presentation: Self Fed Oral Phase Impairments: Impaired mastication Pharyngeal Phase Impairments: Multiple swallows;Cough - Immediate;Cough - Delayed;Wet Vocal Quality Other Comments: towards end of meal when patient eating bite of salad, he started coughing with eyes watering.      Nathan Bruce 08/20/2018,5:34 PM   Angela Nevin, MA, CCC-SLP Speech Therapy [facility] Pager: [phone]

## 2018-08-20 NOTE — [place]2 (Signed)
[place] MEDICAID FL2 LEVEL OF CARE SCREENING TOOL     IDENTIFICATION  Patient Name: Nathan Bruce Birthdate: 1945/06/05 Sex: male Admission Date (Current Location): 08/17/2018  [facility] [place]   Facility and Address:  [facility] [place], [address], [place], [place] [address]      Provider Number: [id number]  Attending Physician Name and Address:  Zannie CoveJoseph, Preetha, MD  Relative Name and Phone Number:       Current Level of Care: Hospital Recommended Level of Care: Skilled Nursing Facility Prior Approval Number:    Date Approved/Denied:   PASRR Number: [id number]  Discharge Plan: SNF    Current Diagnoses: Patient Active Problem List   Diagnosis Date Noted  . Acute CVA (cerebrovascular accident) (HCC) 08/18/2018  . Community acquired pneumonia 08/18/2018  . Anemia 08/18/2018  . HTN (hypertension) 08/18/2018  . Hyperlipidemia 08/18/2018    Orientation RESPIRATION BLADDER Height & Weight     Self  Normal Incontinent Weight: 110 lb 0.2 oz (49.9 kg) Height:  5\' 3"  (160 cm)  BEHAVIORAL SYMPTOMS/MOOD NEUROLOGICAL BOWEL NUTRITION STATUS      Incontinent Diet(see DC summary)  AMBULATORY STATUS COMMUNICATION OF NEEDS Skin   Extensive Assist Verbally Normal, Other (Comment)(eczema)                       Personal Care Assistance Level of Assistance  Bathing, Feeding, Dressing Bathing Assistance: Maximum assistance Feeding assistance: Limited assistance Dressing Assistance: Maximum assistance     Functional Limitations Info  Sight, Hearing, Speech Sight Info: Adequate Hearing Info: Adequate Speech Info: Adequate    SPECIAL CARE FACTORS FREQUENCY  PT (By licensed PT), OT (By licensed OT)     PT Frequency: 5x/wk OT Frequency: 5x/wk            Contractures Contractures Info: Not present    Additional Factors Info  Code Status, Allergies Code Status Info: Full Allergies Info: NKA           Current  Medications (08/20/2018):  This is the current hospital active medication list Current Facility-Administered Medications  Medication Dose Route Frequency Provider Last Rate Last Dose  . 0.9 %  sodium chloride infusion   Intravenous Continuous Marlin CanaryVann, Jessica U, DO 50 mL/hr at 08/20/18 0651    . acetaminophen (TYLENOL) tablet 650 mg  650 mg Oral Q4H PRN Rometta EmeryGarba, Mohammad L, MD   650 mg at 08/20/18 0116   Or  . acetaminophen (TYLENOL) solution 650 mg  650 mg Per Tube Q4H PRN Rometta EmeryGarba, Mohammad L, MD       Or  . acetaminophen (TYLENOL) suppository 650 mg  650 mg Rectal Q4H PRN Rometta EmeryGarba, Mohammad L, MD      . aspirin tablet 325 mg  325 mg Oral Daily Rometta EmeryGarba, Mohammad L, MD   325 mg at 08/20/18 1156  . atorvastatin (LIPITOR) tablet 40 mg  40 mg Oral q1800 Rometta EmeryGarba, Mohammad L, MD   40 mg at 08/19/18 1752  . azithromycin (ZITHROMAX) 500 mg in sodium chloride 0.9 % 250 mL IVPB  500 mg Intravenous Q24H Earlie LouGarba, Mohammad L, MD 250 mL/hr at 08/19/18 2240 500 mg at 08/19/18 2240  . cefTRIAXone (ROCEPHIN) 1 g in sodium chloride 0.9 % 100 mL IVPB  1 g Intravenous Q24H Earlie LouGarba, Mohammad L, MD 200 mL/hr at 08/19/18 2133 1 g at 08/19/18 2133  . doxepin (SINEQUAN) capsule 25 mg  25 mg Oral QHS Rometta EmeryGarba, Mohammad L, MD  25 mg at 08/19/18 2151  . [START ON 08/21/2018] enoxaparin (LOVENOX) injection 40 mg  40 mg Subcutaneous Q24H Zannie Cove, MD      . furosemide (LASIX) tablet 20 mg  20 mg Oral Daily Earlie Lou L, MD   20 mg at 08/20/18 1156  . metoprolol tartrate (LOPRESSOR) tablet 12.5 mg  12.5 mg Oral BID Marlin Canary U, DO   12.5 mg at 08/20/18 1156  . multivitamin with minerals tablet 1 tablet  1 tablet Oral Daily Rometta Emery, MD   1 tablet at 08/20/18 1200  . pantoprazole (PROTONIX) EC tablet 40 mg  40 mg Oral Daily Earlie Lou L, MD   40 mg at 08/20/18 1155  . senna-docusate (Senokot-S) tablet 1 tablet  1 tablet Oral QHS PRN Rometta Emery, MD      . vitamin B-12 (CYANOCOBALAMIN) tablet 500 mcg  500 mcg Oral  Daily Rometta Emery, MD   500 mcg at 08/20/18 1157     Discharge Medications: Please see discharge summary for a list of discharge medications.  Relevant Imaging Results:  Relevant Lab Results:   Additional Information SS#: [id number]  Baldemar Lenis, LCSW

## 2018-08-20 NOTE — Progress Notes (Signed)
STROKE TEAM PROGRESS NOTE   INTERVAL HISTORY I have personally reviewed patient's history of presenting illness via using his daughter over the phone as [place] language interpreter.  Patient states he had a prior history of stroke in January 2020 when he was visiting [place].  He had bilateral leg weakness from which he made good recovery but he was not able to walk independently at baseline.  The daughter is not able to give any details about his stroke work-up or treatment plan.  Patient denies any history of atrial fibrillation or cardiac problems  Vitals:   08/19/18 1946 08/20/18 0027 08/20/18 0442 08/20/18 0726  BP: (!) 121/54 (!) 122/59 (!) 139/56 128/68  Pulse: 80 83 84 90  Resp: 18 18 17 18   Temp: 98.3 F (36.8 C) 98 F (36.7 C) 98.2 F (36.8 C) 98 F (36.7 C)  TempSrc: Oral Oral Oral Axillary  SpO2: 90% 100% 100% 100%  Weight:      Height:        CBC:  Recent Labs  Lab 08/19/18 0532 08/20/18 0940  WBC 8.3 8.9  HGB 8.7* 9.9*  HCT 26.7* 31.6*  MCV 70.8* 71.5*  PLT 418* 411*    Basic Metabolic Panel:  Recent Labs  Lab 08/19/18 0532 08/20/18 0445  NA 134* 134*  K 4.5 4.5  CL 103 103  CO2 21* 20*  GLUCOSE 94 99  BUN 10 13  CREATININE 0.72 0.72  CALCIUM 7.6* 7.7*   Lipid Panel:     Component Value Date/Time   CHOL 129 08/18/2018 0453   TRIG 131 08/18/2018 0453   HDL 19 (L) 08/18/2018 0453   CHOLHDL 6.8 08/18/2018 0453   VLDL 26 08/18/2018 0453   LDLCALC 84 08/18/2018 0453   HgbA1c:  Lab Results  Component Value Date   HGBA1C 4.9 08/18/2018   Urine Drug Screen: No results found for: LABOPIA, COCAINSCRNUR, LABBENZ, AMPHETMU, THCU, LABBARB  Alcohol Level No results found for: ETH  IMAGING Ct Angio Head W Or Wo Contrast  Result Date: 08/19/2018 CLINICAL DATA:  Stroke EXAM: CT ANGIOGRAPHY HEAD AND NECK TECHNIQUE: Multidetector CT imaging of the head and neck was performed using the standard protocol during bolus administration of intravenous  contrast. Multiplanar CT image reconstructions and MIPs were obtained to evaluate the vascular anatomy. Carotid stenosis measurements (when applicable) are obtained utilizing NASCET criteria, using the distal internal carotid diameter as the denominator. CONTRAST:  100mL OMNIPAQUE IOHEXOL 350 MG/ML SOLN COMPARISON:  MRI head 08/18/2018 FINDINGS: CT HEAD FINDINGS Brain: Hypodensity in the right medial frontal parietal lobe compatible with acute ACA infarct. Chronic left ACA infarct. Small area of acute infarction left frontal parietal lobe as noted on MRI. Acute infarct right medial occipital lobe as noted on MRI. Small acute infarct left cerebellum best seen on MRI. Small acute infarct left cerebellum. No acute hemorrhage or mass. Generalized atrophy without midline shift Vascular: Atherosclerotic calcification. Negative for hyperdense vessel Skull: Negative Sinuses: Negative Orbits: Negative Review of the MIP images confirms the above findings CTA NECK FINDINGS Aortic arch: Mild atherosclerotic disease aortic arch without aneurysm or dissection. Proximal great vessels widely patent. Right carotid system: Mild atherosclerotic calcification right carotid bifurcation without significant stenosis Left carotid system: Mild atherosclerotic calcification left carotid bifurcation without stenosis Vertebral arteries: Bruce vertebral arteries patent to the basilar. Mild stenosis origin of right vertebral artery. Skeleton: Cervical spondylosis.  No acute skeletal abnormality. Other neck: Subcentimeter left thyroid nodule. No mass or adenopathy in the neck. Upper chest: Small  bilateral pleural effusions. No infiltrate or mass in the upper lobes. Review of the MIP images confirms the above findings CTA HEAD FINDINGS Anterior circulation: Mild atherosclerotic disease cavernous carotid bilaterally without stenosis. Severe stenosis right A2 segment corresponding to distal right ACA infarct. Left ACA patent. Middle cerebral artery  patent bilaterally without stenosis or branch occlusion. Posterior circulation: Bruce vertebral arteries patent to the basilar. Mild stenosis distal vertebral bilaterally. PICA patent bilaterally. Basilar widely patent. Superior cerebellar artery patent bilaterally. Hypoplastic distal basilar due to fetal origin of the posterior cerebral artery bilaterally. Fetal origin left posterior cerebral artery which is small but patent. Fetal origin right posterior cerebral artery which is small and severely diseased. Venous sinuses: Patent Anatomic variants: None Delayed phase: Normal enhancement postcontrast infusion. Review of the MIP images confirms the above findings IMPRESSION: 1. Acute/subacute infarcts in the right ACA territory, right occipital lobe, left parietal lobe, and left cerebellum as noted on MRI yesterday. No acute hemorrhage. 2. Mild atherosclerotic disease in the carotid bifurcation bilaterally. Mild vertebral stenosis bilaterally. No significant carotid or vertebral artery stenosis in the neck. 3. Severe disease in the right A2 segment accounting for right ACA infarct. 4. Severe disease right posterior cerebral artery accounting for right occipital infarct. Electronically Signed   By: Marlan Palau M.D.   On: 08/19/2018 15:09   Ct Angio Neck W Or Wo Contrast  Result Date: 08/19/2018 CLINICAL DATA:  Stroke EXAM: CT ANGIOGRAPHY HEAD AND NECK TECHNIQUE: Multidetector CT imaging of the head and neck was performed using the standard protocol during bolus administration of intravenous contrast. Multiplanar CT image reconstructions and MIPs were obtained to evaluate the vascular anatomy. Carotid stenosis measurements (when applicable) are obtained utilizing NASCET criteria, using the distal internal carotid diameter as the denominator. CONTRAST:  OMNIPAQUE IOHEXOL 350 MG/ML SOLN COMPARISON:  MRI head 08/18/2018 FINDINGS: CT HEAD FINDINGS Brain: Hypodensity in the right medial frontal parietal lobe  compatible with acute ACA infarct. Chronic left ACA infarct. Small area of acute infarction left frontal parietal lobe as noted on MRI. Acute infarct right medial occipital lobe as noted on MRI. Small acute infarct left cerebellum best seen on MRI. Small acute infarct left cerebellum. No acute hemorrhage or mass. Generalized atrophy without midline shift Vascular: Atherosclerotic calcification. Negative for hyperdense vessel Skull: Negative Sinuses: Negative Orbits: Negative Review of the MIP images confirms the above findings CTA NECK FINDINGS Aortic arch: Mild atherosclerotic disease aortic arch without aneurysm or dissection. Proximal great vessels widely patent. Right carotid system: Mild atherosclerotic calcification right carotid bifurcation without significant stenosis Left carotid system: Mild atherosclerotic calcification left carotid bifurcation without stenosis Vertebral arteries: Bruce vertebral arteries patent to the basilar. Mild stenosis origin of right vertebral artery. Skeleton: Cervical spondylosis.  No acute skeletal abnormality. Other neck: Subcentimeter left thyroid nodule. No mass or adenopathy in the neck. Upper chest: Small bilateral pleural effusions. No infiltrate or mass in the upper lobes. Review of the MIP images confirms the above findings CTA HEAD FINDINGS Anterior circulation: Mild atherosclerotic disease cavernous carotid bilaterally without stenosis. Severe stenosis right A2 segment corresponding to distal right ACA infarct. Left ACA patent. Middle cerebral artery patent bilaterally without stenosis or branch occlusion. Posterior circulation: Bruce vertebral arteries patent to the basilar. Mild stenosis distal vertebral bilaterally. PICA patent bilaterally. Basilar widely patent. Superior cerebellar artery patent bilaterally. Hypoplastic distal basilar due to fetal origin of the posterior cerebral artery bilaterally. Fetal origin left posterior cerebral artery which is small but  patent. Fetal origin  right posterior cerebral artery which is small and severely diseased. Venous sinuses: Patent Anatomic variants: None Delayed phase: Normal enhancement postcontrast infusion. Review of the MIP images confirms the above findings IMPRESSION: 1. Acute/subacute infarcts in the right ACA territory, right occipital lobe, left parietal lobe, and left cerebellum as noted on MRI yesterday. No acute hemorrhage. 2. Mild atherosclerotic disease in the carotid bifurcation bilaterally. Mild vertebral stenosis bilaterally. No significant carotid or vertebral artery stenosis in the neck. 3. Severe disease in the right A2 segment accounting for right ACA infarct. 4. Severe disease right posterior cerebral artery accounting for right occipital infarct. Electronically Signed   By: Marlan Palau M.D.   On: 08/19/2018 15:09   Mr Brain Wo Contrast  Result Date: 08/18/2018 CLINICAL DATA:  Left-sided weakness and altered mental status. EXAM: MRI HEAD WITHOUT CONTRAST TECHNIQUE: Multiplanar, multiecho pulse sequences of the brain and surrounding structures were obtained without intravenous contrast. COMPARISON:  Head CT 08/17/2018 FINDINGS: Brain: Multiple regions acute/subacute infarction are present consistent with either embolic disease or vasculitis. There are a cluster of small vessel infarctions in the left cerebellum. Right cerebral hemisphere shows extensive region of infarctions in the right anterior cerebral artery territory and in the right posterior cerebral artery territory. Relative sparing of the right middle cerebral artery territory with only a small acute infarction in the right caudate head. In the left hemisphere, there is acute infarction in the left posterior parietal lobe consistent with an MCA branch vessel infarction. There is infarction at the left frontoparietal vertex that is probably MCA territory but could to some degree be ACA territory. Areas of infarction show mild swelling. There  are very minor blood products in the right medial parietal infarction. No evidence of tumor patient does have old infarction in the left ACA territory affecting the cingulate gyrus region. Old lacunar infarctions in the basal ganglia and in the right thalamus. No hydrocephalus. No extra-axial collection. Vascular: Major vessels at the base of the brain show flow. Skull and upper cervical spine: Negative Sinuses/Orbits: Clear/normal Other: None IMPRESSION: Scattered acute infarctions affecting the left cerebellum and Bruce cerebral hemispheres. The pattern is most consistent with embolic disease from the heart or ascending aorta. Differential diagnosis does include vasculitis. Old infarction in the left cingulate gyrus. Infarctions are most prominent in the right anterior cerebral artery territory, the right posterior cerebral artery territory in the left middle cerebral artery territory. Areas of acute infarction show mild swelling but there is no mass effect or shift. Small amount of petechial blood products in the right medial parietal region. Electronically Signed   By: Paulina Fusi M.D.   On: 08/18/2018 16:41   Vas US Carotid (at [facility])  Result Date: 08/19/2018 Carotid Arterial Duplex Study Indications: CVA. Performing Technologist: Toma Deiters RVS  Examination Guidelines: A complete evaluation includes B-mode imaging, spectral Doppler, color Doppler, and power Doppler as needed of all accessible portions of each vessel. Bilateral testing is considered an integral part of a complete examination. Limited examinations for reoccurring indications may be performed as noted.  Right Carotid Findings: +----------+--------+--------+--------+---------------------+------------------+           PSV cm/sEDV cm/sStenosisDescribe             Comments           +----------+--------+--------+--------+---------------------+------------------+ CCA Prox  121     16  mild intimal                                                              changes            +----------+--------+--------+--------+---------------------+------------------+ CCA Distal100     10                                   mild intimal                                                              changes            +----------+--------+--------+--------+---------------------+------------------+ ICA Prox  97      10      1-39%   diffuse and          mild plaque                                          heterogenous                            +----------+--------+--------+--------+---------------------+------------------+ ICA Mid   103     22                                                      +----------+--------+--------+--------+---------------------+------------------+ ICA Distal113     26                                                      +----------+--------+--------+--------+---------------------+------------------+ ECA       91      7               heterogenous         mild plaque        +----------+--------+--------+--------+---------------------+------------------+ +----------+--------+-------+--------+-------------------+           PSV cm/sEDV cmsDescribeArm Pressure (mmHG) +----------+--------+-------+--------+-------------------+ [id number]                                        +----------+--------+-------+--------+-------------------+ +---------+--------+---+--------+--+ VertebralPSV cm/s150EDV cm/s14 +---------+--------+---+--------+--+  Left Carotid Findings: +----------+--------+--------+--------+---------------------+------------------+           PSV cm/sEDV cm/sStenosisDescribe             Comments           +----------+--------+--------+--------+---------------------+------------------+ CCA Prox  133     17  mild intimal                                                               changes            +----------+--------+--------+--------+---------------------+------------------+ CCA Distal97      7                                    mild intimal                                                              changes            +----------+--------+--------+--------+---------------------+------------------+ ICA Prox  118     18      1-39%   heterogenous and     mild plaque                                          diffuse                                 +----------+--------+--------+--------+---------------------+------------------+ ICA Mid   94      24                                                      +----------+--------+--------+--------+---------------------+------------------+ ICA Distal104     26                                                      +----------+--------+--------+--------+---------------------+------------------+ ECA       95      4               heterogenous         mild plaque        +----------+--------+--------+--------+---------------------+------------------+ +----------+--------+--------+--------+-------------------+ SubclavianPSV cm/sEDV cm/sDescribeArm Pressure (mmHG) +----------+--------+--------+--------+-------------------+           181                                         +----------+--------+--------+--------+-------------------+ +---------+--------+---+--------+--+ VertebralPSV cm/s113EDV cm/s10 +---------+--------+---+--------+--+  Summary: Right Carotid: Velocities in the right ICA are consistent with a 1-39% stenosis. Left Carotid: Velocities in the left ICA are consistent with a 1-39% stenosis. Vertebrals:  Bilateral vertebral arteries demonstrate antegrade flow. Subclavians: Normal flow hemodynamics were seen in bilateral subclavian              arteries. *See table(s) above for measurements and observations.  Electronically signed by Delia Heady MD on 08/19/2018  at 12:52:46 PM.    Final    Vas [place] Lower Extremity Venous (dvt)  Result Date: 08/19/2018  Lower Venous Study Indications: Swelling.  Performing Technologist: Chanda Busing RVT  Examination Guidelines: A complete evaluation includes B-mode imaging, spectral Doppler, color Doppler, and power Doppler as needed of all accessible portions of each vessel. Bilateral testing is considered an integral part of a complete examination. Limited examinations for reoccurring indications may be performed as noted.  +-----+---------------+---------+-----------+----------+-------+ RIGHTCompressibilityPhasicitySpontaneityPropertiesSummary +-----+---------------+---------+-----------+----------+-------+ CFV  Full           Yes      Yes                          +-----+---------------+---------+-----------+----------+-------+   +---------+---------------+---------+-----------+----------+-------+ LEFT     CompressibilityPhasicitySpontaneityPropertiesSummary +---------+---------------+---------+-----------+----------+-------+ CFV      Full           Yes      Yes                          +---------+---------------+---------+-----------+----------+-------+ SFJ      Full                                                 +---------+---------------+---------+-----------+----------+-------+ FV Prox  Full                                                 +---------+---------------+---------+-----------+----------+-------+ FV Mid   Full                                                 +---------+---------------+---------+-----------+----------+-------+ FV DistalFull                                                 +---------+---------------+---------+-----------+----------+-------+ PFV      Full                                                 +---------+---------------+---------+-----------+----------+-------+ POP      Full           Yes      Yes                           +---------+---------------+---------+-----------+----------+-------+ PTV      Full                                                 +---------+---------------+---------+-----------+----------+-------+ PERO     Full                                                 +---------+---------------+---------+-----------+----------+-------+  Summary: Right: No evidence of common femoral vein obstruction. Left: There is no evidence of deep vein thrombosis in the lower extremity. No cystic structure found in the popliteal fossa.  *See table(s) above for measurements and observations. Electronically signed by Coral Else MD on 08/19/2018 at 5:12:58 PM.    Final    2D echocardiogram with bubble 1. The left ventricle has normal systolic function, with an ejection fraction of 60-65%. The cavity size was normal. There is mildly increased left ventricular wall thickness. Left ventricular diastolic function could not be evaluated.  2. The right ventricle has normal systolc function. The cavity was normal. There is no increase in right ventricular wall thickness.  3. Left atrial size was mildly dilated.  4. Trivial pericardial effusion is present.  5. No evidence of mitral valve stenosis. Trivial mitral regurgitation.  6. The aortic valve is tricuspid No stenosis of the aortic valve.  7. The aortic root is normal in size and structure.  8. Negative bubble study, no evidence for PFO/ASD.   PHYSICAL EXAM Frail elderly Asian male not in distress. . Afebrile. Head is nontraumatic. Neck is supple without bruit.    Cardiac exam no murmur or gallop. Lungs are clear to auscultation. Distal pulses are well felt. Neurological Exam :  Limited due to using daughter over the phone as an interpreter.  He is awake alert.  As per daughter he appears confused and disoriented.  His speech appears normal.  No dysarthria or aphasia.  Extraocular movements are full range.  He blinks to threat on the right but not on the  left.  Left lower facial weakness.  Tongue midline.  Motor system exam shows left hemiplegia with hypotonia.  Normal strength on the right.  Sensation appears preserved bilaterally.  Right plantar downgoing left equivocal.  Gait not tested ASSESSMENT/PLAN [name] is a 73 y.o. male with history of , hypercholesterolemia, food allergy, arthritis and eczema presenting with L sided weakness and confusion w/ myalgias and arthralgias over the past few weeks and weight loss since Jan 2020. MRI revealed scattered L cerebellum and B cerebral infarcts.    Stroke:   Bilateral cerebral and left cerebellar scattered infarcts embolic secondary to unknown source, suspicious for atrial fibrillation  CT head subacute R parasagittal frontal parietal cortex infarct.  Old left posterior parietal lobe infarct.  Atrophy.  Small vessel disease.  Encephalomalacia L> R frontal lobe.  MRI scattered L cerebellar and B cerebral hemisphere infarcts (R ACA, R PCA, L MCA).  Old L cingulate gyrus infarct.  Small petechial hemorrhage right medial parietal  CTA head & neck acute/subacute R ACA, R occipital, L parietal, L cerebellar infarcts.  No hemorrhage.  Mild B ICA, B VA atherosclerosis.  Severe R A2 & R PCA disease.  Carotid Doppler  B ICA 1-39% stenosis, VAs antegrade   2D Echo EF 60-65 %. No source of embolus.  Bubble study negative  Lower extremity Doppler negative DVT  LDL 84  HgbA1c 4.9  Lovenox 30 mg sq daily for VTE prophylaxis  No antithrombotic prior to admission, now on aspirin 325 mg daily.  Changed to anticoagulation if A. fib found  Therapy recommendations: SNF  Disposition: Pending  Hyperlipidemia  Home meds: No statin  Now on Lipitor 40 mg daily  LDL 84, goal < 70  Continue statin at discharge  Other Stroke Risk Factors  Advanced age  Former cigarette smoker, quit 13 years ago   Other Active Problems  Left leg swelling.  Dopplers negative for DVT.    Weight loss/elevated  AST/ALT.seen by GI as an outpatient with rheumatology appointment end of April  Anemia, outpatient work-up in progress  Moderately severe protein calorie malnutrition, question malignancy  Left lower lobe pneumonia, community-acquired, on antibiotics  Hyponatremia  Hospital day # 2  I have personally obtained history,examined this patient, reviewed notes, independently viewed imaging studies, participated in medical decision making and plan of care.ROS completed by me personally and pertinent positives fully documented  I have made any additions or clarifications directly to the above note.  73 year old Asian male with recent history of strokes presents with sudden onset of left hemiplegia due to by cerebral embolic infarcts etiology indeterminate but likely atrial fibrillation.  Patient also appears to have some cognitive impairment.  Recommend continue ongoing stroke work-up patient may need outpatient cardiac monitoring for paroxysmal A. fib after discharge.  Aspirin and Plavix for 3 weeks followed by aspirin alone.  Long discussion with the patient and daughter about his embolic strokes and discuss plan for evaluation, treatment and answered questions.  Discussed with Dr. Jomarie Longs.The duration of this appointment visit was of face-to-face time with the patient. Greater than 50% of this time was spent in counseling, explanation of diagnosis, planning of further management, and coordination of care.       Delia Heady, MD Medical Director [facility] Pager: [phone] 08/20/2018 3:13 PM   To contact Stroke Continuity provider, please refer to [email or web address]. After hours, contact General Neurology

## 2018-08-20 NOTE — Progress Notes (Signed)
Occupational Therapy Treatment Patient Details Name: Nathan Bruce MRN: [id number] DOB: 1945/06/18 Today's Date: 08/20/2018    History of present illness Patient is a 73 y/o male presenting to the ED on 08/17/2018 with primary complaints of L sided weakness and AMS. PMH significant of hypertension, hyperlipidemia, osteoarthritis. 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 Patient was screened for COVID-19 and was negative.  Chest x-ray also showed left lower lobe consolidation concerning for pneumonia.   OT comments  Pt requires soft texture food and (A) to locate food on the tray. Pt will over and undershoot with fork to mouth as well. Pt shows awareness to food on face by using tongue to move it back into move on right side of face.    Follow Up Recommendations  SNF;Supervision/Assistance - 24 hour    Equipment Recommendations  3 in 1 bedside commode    Recommendations for Other Services      Precautions / Restrictions Precautions Precautions: Fall Restrictions Weight Bearing Restrictions: No       Mobility Bed Mobility Overal bed mobility: Needs Assistance Bed Mobility: Supine to Sit;Sit to Supine     Supine to sit: Total assist;+2 for physical assistance Sit to supine: Total assist;+2 for physical assistance   General bed mobility comments: total assist for all ascepts of bed mobility at this time, +2 required  Transfers                      Balance Overall balance assessment: Needs assistance Sitting-balance support: Bilateral upper extremity supported;Feet supported Sitting balance-Leahy Scale: Poor Sitting balance - Comments: fluctuating between needing max A to min A to maintain sitting upright at EOB Postural control: Posterior lean;Left lateral lean                                 ADL either performed or assessed with clinical judgement   ADL Overall ADL's  : Needs assistance/impaired Eating/Feeding: Moderate assistance;Bed level Eating/Feeding Details (indicate cue type and reason): daughter reports to PT that you have to "make him eat" pt very automatic with self feeding this session with over and undershooting. pt also reaching for non food items iwth fork and tapping them. pt lacks awareness that items are food Grooming: Wash/dry face;Minimal assistance                                 General ADL Comments: pt completing self feeding with extended time     Vision       Perception     Praxis      Cognition Arousal/Alertness: Awake/alert Behavior During Therapy: Flat affect Overall Cognitive Status: Difficult to assess                                 General Comments: called daughter and had her assist with translation        Exercises     Shoulder Instructions       General Comments bed level session to assess vision and self feeding with automatic task after PT session    Pertinent Vitals/ Pain       Pain Assessment: No/denies pain Faces Pain Scale: Hurts even more Pain Location: bilateral knees with mobility Pain Descriptors /  Indicators: Guarding;Grimacing Pain Intervention(s): Monitored during session;Repositioned  Home Living                                          Prior Functioning/Environment              Frequency  Min 2X/week        Progress Toward Goals  OT Goals(current goals can now be found in the care plan section)  Progress towards OT goals: Progressing toward goals  Acute Rehab OT Goals Patient Stated Goal: none stated OT Goal Formulation: Patient unable to participate in goal setting Time For Goal Achievement: 09/01/18 Potential to Achieve Goals: Good ADL Goals Pt Will Perform Grooming: with min assist;sitting;bed level Pt Will Perform Upper Body Bathing: with min assist;sitting;bed level Pt Will Transfer to Toilet: with mod  assist;stand pivot transfer;bedside commode;with +2 assist Additional ADL Goal #1: Pt will follow 1 step commands with 50% accuracy in order to maximize partiicpaiton in ADLs. Additional ADL Goal #2: Pt will complete bed mobility with mod assist +2 and maintain sitting EOB with min assist as precursor to ADls.  Plan Discharge plan remains appropriate    Co-evaluation                 AM-PAC OT "6 Clicks" Daily Activity     Outcome Measure   Help from another person eating meals?: A Little Help from another person taking care of personal grooming?: Total Help from another person toileting, which includes using toliet, bedpan, or urinal?: Total Help from another person bathing (including washing, rinsing, drying)?: Total Help from another person to put on and taking off regular upper body clothing?: Total Help from another person to put on and taking off regular lower body clothing?: Total 6 Click Score: 8    End of Session    OT Visit Diagnosis: Other abnormalities of gait and mobility (R26.89);Other symptoms and signs involving cognitive function;Other symptoms and signs involving the nervous system (R29.898)   Activity Tolerance Patient tolerated treatment well   Patient Left in bed;with call bell/phone within reach   Nurse Communication Mobility status        Time: [id number] OT Time Calculation (min): 25 min  Charges: OT General Charges $OT Visit: 1 Visit OT Treatments $Self Care/Home Management : 23-37 mins   Mateo FlowBrynn Alyza Artiaga, OTR/L  Acute Rehabilitation Services Pager: [phone] Office: [phone] .    Mateo FlowBrynn Natale Barba 08/20/2018, 2:27 PM

## 2018-08-20 NOTE — Progress Notes (Signed)
MD. Donnamarie Poag called back states patient is in SR no EKG needed

## 2018-08-20 NOTE — Progress Notes (Signed)
Paged on call md informing him patient went into afib rvr and I will get an ekg right now

## 2018-08-20 NOTE — Progress Notes (Signed)
Physical Therapy Treatment Patient Details Name: NYREE SMUCK MRN: [id number] DOB: 1946-03-22 Today's Date: 08/20/2018    History of Present Illness Patient is a 73 y/o male presenting to the ED on 08/17/2018 with primary complaints of L sided weakness and AMS. PMH significant of hypertension, hyperlipidemia, osteoarthritis. 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 Patient was screened for COVID-19 and was negative.  Chest x-ray also showed left lower lobe consolidation concerning for pneumonia.    PT Comments    Pt continues to require heavy physical assistance of two people for all mobility. Focus of session was bed mobility and sitting balance (fluctuating between min-max A needed). Pt would continue to benefit from skilled physical therapy services at this time while admitted and after d/c to address the below listed limitations in order to improve overall safety and independence with functional mobility.    Follow Up Recommendations  SNF;Supervision/Assistance - 24 hour     Equipment Recommendations  Other (comment)(defer to next venue)    Recommendations for Other Services       Precautions / Restrictions Precautions Precautions: Fall Restrictions Weight Bearing Restrictions: No    Mobility  Bed Mobility Overal bed mobility: Needs Assistance Bed Mobility: Supine to Sit;Sit to Supine     Supine to sit: Total assist;+2 for physical assistance Sit to supine: Total assist;+2 for physical assistance   General bed mobility comments: total assist for all ascepts of bed mobility at this time, +2 required  Transfers                    Ambulation/Gait                 Stairs             Wheelchair Mobility    Modified Rankin (Stroke Patients Only)       Balance Overall balance assessment: Needs assistance Sitting-balance support: Bilateral upper extremity  supported;Feet supported Sitting balance-Leahy Scale: Poor Sitting balance - Comments: fluctuating between needing max A to min A to maintain sitting upright at EOB Postural control: Posterior lean;Left lateral lean                                  Cognition Arousal/Alertness: Awake/alert Behavior During Therapy: Flat affect Overall Cognitive Status: Difficult to assess                                 General Comments: called daughter and had her assist with translation      Exercises      General Comments        Pertinent Vitals/Pain Pain Assessment: Faces Faces Pain Scale: Hurts even more Pain Location: bilateral knees with mobility Pain Descriptors / Indicators: Guarding;Grimacing Pain Intervention(s): Monitored during session;Repositioned    Home Living                      Prior Function            PT Goals (current goals can now be found in the care plan section) Acute Rehab PT Goals PT Goal Formulation: Patient unable to participate in goal setting Time For Goal Achievement: 09/01/18 Potential to Achieve Goals: Fair Progress towards PT goals: Progressing toward goals    Frequency    Min 2X/week  PT Plan Current plan remains appropriate    Co-evaluation              AM-PAC PT "6 Clicks" Mobility   Outcome Measure  Help needed turning from your back to your side while in a flat bed without using bedrails?: Total Help needed moving from lying on your back to sitting on the side of a flat bed without using bedrails?: Total Help needed moving to and from a bed to a chair (including a wheelchair)?: Total Help needed standing up from a chair using your arms (e.g., wheelchair or bedside chair)?: Total Help needed to walk in hospital room?: Total Help needed climbing 3-5 steps with a railing? : Total 6 Click Score: 6    End of Session   Activity Tolerance: Patient limited by pain Patient left: in bed;with  call bell/phone within reach;with bed alarm set;Other (comment)(OT present) Nurse Communication: Mobility status PT Visit Diagnosis: Unsteadiness on feet (R26.81);Other abnormalities of gait and mobility (R26.89);Muscle weakness (generalized) (M62.81)     Time: [id number] PT Time Calculation (min) (ACUTE ONLY): 26 min  Charges:  $Therapeutic Activity: 23-37 mins                     Deborah ChalkJennifer Lavontae Cornia, [place]PT, DPT  Acute Rehabilitation Services Pager [phone] Office [phone]     Alessandra BevelsJennifer M Warda Mcqueary 08/20/2018, 1:07 PM

## 2018-08-21 LAB — SEDIMENTATION RATE: Sed Rate: 63 mm/hr — ABNORMAL HIGH (ref 0–16)

## 2018-08-21 MED ORDER — CEFDINIR 300 MG PO CAPS
300.0000 mg | ORAL_CAPSULE | Freq: Two times a day (BID) | ORAL | Status: DC
  Administered 2018-08-21 – 2018-08-22 (×3): 300 mg via ORAL
  Filled 2018-08-21 (×5): qty 1

## 2018-08-21 MED ORDER — PREDNISONE 20 MG PO TABS
20.0000 mg | ORAL_TABLET | Freq: Every day | ORAL | Status: DC
  Administered 2018-08-21 – 2018-08-22 (×2): 20 mg via ORAL
  Filled 2018-08-21 (×2): qty 1

## 2018-08-21 MED ORDER — APIXABAN 5 MG PO TABS
5.0000 mg | ORAL_TABLET | Freq: Two times a day (BID) | ORAL | Status: DC
  Administered 2018-08-21 – 2018-08-22 (×2): 5 mg via ORAL
  Filled 2018-08-21 (×2): qty 1

## 2018-08-21 NOTE — Progress Notes (Signed)
  Speech Language Pathology Treatment: Dysphagia  Patient Details Name: RITESH AGHA MRN: [id number] DOB: Jul 01, 1945 Today's Date: 08/21/2018 Time: 4235-3614 SLP Time Calculation (min) (ACUTE ONLY): 29 min  Assessment / Plan / Recommendation Clinical Impression  Pt's daughter contacted to assist in translation and for further education of pt's dysphagia during lunch. Moderate-max multimodal cues to initiate feeding fading moderate to sustain attention as session progressed. Cleared oral cavity with prolonged mastication (roast beef- tougher). No s/s aspiration with any consistency. Tactile cues provided to slow rate due to impulsivity. Recommend continue Dys 3 texture, thin liquids, straws or cup. RN tech finished meal with pt- he will need to continue full supervision and assist with meals.   Noted order for speech-cognitive eval. Pt's daughter reported his speech is clear but pt is confused. Recommend assessment at next venue of care when interpreter can be present in person or video (interpreter system doesn not have Rhade interpreter).    HPI HPI: Patient is a 73 y/o male presenting to the ED on 08/17/2018 with primary complaints of L sided weakness and AMS. PMH significant of hypertension, hyperlipidemia, osteoarthritis. 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 Patient was screened for COVID-19 and was negative.  Chest x-ray also showed left lower lobe consolidation concerning for pneumonia.      SLP Plan  Continue with current plan of care       Recommendations  Diet recommendations: Dysphagia 3 (mechanical soft);Thin liquid Liquids provided via: Cup;Straw Medication Administration: Crushed with puree Supervision: Patient able to self feed;Staff to assist with self feeding;Full supervision/cueing for compensatory strategies Compensations: Minimize environmental distractions;Small sips/bites;Slow  rate;Lingual sweep for clearance of pocketing Postural Changes and/or Swallow Maneuvers: Seated upright 90 degrees                Oral Care Recommendations: Oral care BID Follow up Recommendations: Skilled Nursing facility SLP Visit Diagnosis: Dysphagia, oropharyngeal phase (R13.12) Plan: Continue with current plan of care       GO                Royce Macadamia 08/21/2018, 2:07 PM  Breck Coons Lonell Face.Ed Nurse, children's [phone] Office [phone]

## 2018-08-21 NOTE — Progress Notes (Signed)
STROKE TEAM PROGRESS NOTE   INTERVAL HISTORY AF confirmed by EP today on cardiac monitoring.  Patient is yelling and shouting and pointing towards his left leg which appears to show mild swelling of the knee and lower thigh with some discoloration of skin.  Spoke to Dr. Broadus John was obtained history from the patient's daughter that he had some parasitic infection in January in [place] and since then has been complaining of leg pain and skin discoloration and swelling.  He has elevated ESR and complement levels and may have some myositis   Vitals:   08/20/18 2001 08/20/18 2345 08/21/18 0407 08/21/18 1156  BP: (!) 126/50 112/60 115/65 (!) 120/47  Pulse: 79 75 80 85  Resp: '18 16 18 18  ' Temp: 98.4 F (36.9 C) 98.3 F (36.8 C) 98.5 F (36.9 C) 98.5 F (36.9 C)  TempSrc: Axillary Axillary Oral Oral  SpO2: 99% 100% 98% 97%  Weight:      Height:        CBC:  Recent Labs  Lab 08/19/18 0532 08/20/18 0940  WBC 8.3 8.9  HGB 8.7* 9.9*  HCT 26.7* 31.6*  MCV 70.8* 71.5*  PLT 418* 411*    Basic Metabolic Panel:  Recent Labs  Lab 08/19/18 0532 08/20/18 0445  NA 134* 134*  K 4.5 4.5  CL 103 103  CO2 21* 20*  GLUCOSE 94 99  BUN 10 13  CREATININE 0.72 0.72  CALCIUM 7.6* 7.7*   Lipid Panel:     Component Value Date/Time   CHOL 129 08/18/2018 0453   TRIG 131 08/18/2018 0453   HDL 19 (L) 08/18/2018 0453   CHOLHDL 6.8 08/18/2018 0453   VLDL 26 08/18/2018 0453   LDLCALC 84 08/18/2018 0453   HgbA1c:  Lab Results  Component Value Date   HGBA1C 4.9 08/18/2018   Urine Drug Screen: No results found for: LABOPIA, COCAINSCRNUR, LABBENZ, AMPHETMU, THCU, LABBARB  Alcohol Level No results found for: ETH  IMAGING Ct Angio Head W Or Wo Contrast  Result Date: 08/19/2018 CLINICAL DATA:  Stroke EXAM: CT ANGIOGRAPHY HEAD AND NECK TECHNIQUE: Multidetector CT imaging of the head and neck was performed using the standard protocol during bolus administration of intravenous contrast. Multiplanar  CT image reconstructions and MIPs were obtained to evaluate the vascular anatomy. Carotid stenosis measurements (when applicable) are obtained utilizing NASCET criteria, using the distal internal carotid diameter as the denominator. CONTRAST:  139m OMNIPAQUE IOHEXOL 350 MG/ML SOLN COMPARISON:  MRI head 08/18/2018 FINDINGS: CT HEAD FINDINGS Brain: Hypodensity in the right medial frontal parietal lobe compatible with acute ACA infarct. Chronic left ACA infarct. Small area of acute infarction left frontal parietal lobe as noted on MRI. Acute infarct right medial occipital lobe as noted on MRI. Small acute infarct left cerebellum best seen on MRI. Small acute infarct left cerebellum. No acute hemorrhage or mass. Generalized atrophy without midline shift Vascular: Atherosclerotic calcification. Negative for hyperdense vessel Skull: Negative Sinuses: Negative Orbits: Negative Review of the MIP images confirms the above findings CTA NECK FINDINGS Aortic arch: Mild atherosclerotic disease aortic arch without aneurysm or dissection. Proximal great vessels widely patent. Right carotid system: Mild atherosclerotic calcification right carotid bifurcation without significant stenosis Left carotid system: Mild atherosclerotic calcification left carotid bifurcation without stenosis Vertebral arteries: Both vertebral arteries patent to the basilar. Mild stenosis origin of right vertebral artery. Skeleton: Cervical spondylosis.  No acute skeletal abnormality. Other neck: Subcentimeter left thyroid nodule. No mass or adenopathy in the neck. Upper chest: Small bilateral pleural  effusions. No infiltrate or mass in the upper lobes. Review of the MIP images confirms the above findings CTA HEAD FINDINGS Anterior circulation: Mild atherosclerotic disease cavernous carotid bilaterally without stenosis. Severe stenosis right A2 segment corresponding to distal right ACA infarct. Left ACA patent. Middle cerebral artery patent bilaterally  without stenosis or branch occlusion. Posterior circulation: Both vertebral arteries patent to the basilar. Mild stenosis distal vertebral bilaterally. PICA patent bilaterally. Basilar widely patent. Superior cerebellar artery patent bilaterally. Hypoplastic distal basilar due to fetal origin of the posterior cerebral artery bilaterally. Fetal origin left posterior cerebral artery which is small but patent. Fetal origin right posterior cerebral artery which is small and severely diseased. Venous sinuses: Patent Anatomic variants: None Delayed phase: Normal enhancement postcontrast infusion. Review of the MIP images confirms the above findings IMPRESSION: 1. Acute/subacute infarcts in the right ACA territory, right occipital lobe, left parietal lobe, and left cerebellum as noted on MRI yesterday. No acute hemorrhage. 2. Mild atherosclerotic disease in the carotid bifurcation bilaterally. Mild vertebral stenosis bilaterally. No significant carotid or vertebral artery stenosis in the neck. 3. Severe disease in the right A2 segment accounting for right ACA infarct. 4. Severe disease right posterior cerebral artery accounting for right occipital infarct. Electronically Signed   By: Franchot Gallo M.D.   On: 08/19/2018 15:09   Ct Angio Neck W Or Wo Contrast  Result Date: 08/19/2018 CLINICAL DATA:  Stroke EXAM: CT ANGIOGRAPHY HEAD AND NECK TECHNIQUE: Multidetector CT imaging of the head and neck was performed using the standard protocol during bolus administration of intravenous contrast. Multiplanar CT image reconstructions and MIPs were obtained to evaluate the vascular anatomy. Carotid stenosis measurements (when applicable) are obtained utilizing NASCET criteria, using the distal internal carotid diameter as the denominator. CONTRAST:  133m OMNIPAQUE IOHEXOL 350 MG/ML SOLN COMPARISON:  MRI head 08/18/2018 FINDINGS: CT HEAD FINDINGS Brain: Hypodensity in the right medial frontal parietal lobe compatible with acute  ACA infarct. Chronic left ACA infarct. Small area of acute infarction left frontal parietal lobe as noted on MRI. Acute infarct right medial occipital lobe as noted on MRI. Small acute infarct left cerebellum best seen on MRI. Small acute infarct left cerebellum. No acute hemorrhage or mass. Generalized atrophy without midline shift Vascular: Atherosclerotic calcification. Negative for hyperdense vessel Skull: Negative Sinuses: Negative Orbits: Negative Review of the MIP images confirms the above findings CTA NECK FINDINGS Aortic arch: Mild atherosclerotic disease aortic arch without aneurysm or dissection. Proximal great vessels widely patent. Right carotid system: Mild atherosclerotic calcification right carotid bifurcation without significant stenosis Left carotid system: Mild atherosclerotic calcification left carotid bifurcation without stenosis Vertebral arteries: Both vertebral arteries patent to the basilar. Mild stenosis origin of right vertebral artery. Skeleton: Cervical spondylosis.  No acute skeletal abnormality. Other neck: Subcentimeter left thyroid nodule. No mass or adenopathy in the neck. Upper chest: Small bilateral pleural effusions. No infiltrate or mass in the upper lobes. Review of the MIP images confirms the above findings CTA HEAD FINDINGS Anterior circulation: Mild atherosclerotic disease cavernous carotid bilaterally without stenosis. Severe stenosis right A2 segment corresponding to distal right ACA infarct. Left ACA patent. Middle cerebral artery patent bilaterally without stenosis or branch occlusion. Posterior circulation: Both vertebral arteries patent to the basilar. Mild stenosis distal vertebral bilaterally. PICA patent bilaterally. Basilar widely patent. Superior cerebellar artery patent bilaterally. Hypoplastic distal basilar due to fetal origin of the posterior cerebral artery bilaterally. Fetal origin left posterior cerebral artery which is small but patent. Fetal origin right  posterior  cerebral artery which is small and severely diseased. Venous sinuses: Patent Anatomic variants: None Delayed phase: Normal enhancement postcontrast infusion. Review of the MIP images confirms the above findings IMPRESSION: 1. Acute/subacute infarcts in the right ACA territory, right occipital lobe, left parietal lobe, and left cerebellum as noted on MRI yesterday. No acute hemorrhage. 2. Mild atherosclerotic disease in the carotid bifurcation bilaterally. Mild vertebral stenosis bilaterally. No significant carotid or vertebral artery stenosis in the neck. 3. Severe disease in the right A2 segment accounting for right ACA infarct. 4. Severe disease right posterior cerebral artery accounting for right occipital infarct. Electronically Signed   By: Franchot Gallo M.D.   On: 08/19/2018 15:09   Dg Swallowing Func-speech Pathology  Result Date: 08/20/2018 . Objective Swallowing Evaluation: Type of Study: MBS-Modified Barium Swallow Study  Patient Details Name: DARTANIAN KNAGGS MRN: [id number] Date of Birth: 1945/08/22 Today's Date: 08/20/2018 Time: SLP Start Time (ACUTE ONLY): 1410 -SLP Stop Time (ACUTE ONLY): 1430 SLP Time Calculation (min) (ACUTE ONLY): 20 min Past Medical History: Past Medical History: Diagnosis Date . Arthritis   knees . Food allergy   Shellfish allergy . Hypercholesterolemia  . Hypertension  Past Surgical History: Past Surgical History: Procedure Laterality Date . APPENDECTOMY   . CATARACT EXTRACTION N/A  . I&D EXTREMITY Left 08/22/2013  Procedure: IRRIGATION AND DEBRIDEMENT EXTREMITY;  Surgeon: Tennis Must, MD;  Location: [facility];  Service: Orthopedics;  Laterality: Left;  left ring finger . [place] SURGERY  2011  in [place]  . PERCUTANEOUS PINNING Left 08/22/2013  Procedure: PERCUTANEOUS PINNING EXTREMITY;  Surgeon: Tennis Must, MD;  Location: [facility];  Service: Orthopedics;  Laterality: Left;  left ring finger . TOOTH EXTRACTION   HPI: Patient is a 73 y/o male presenting to the ED on  08/17/2018 with primary complaints of L sided weakness and AMS. PMH significant of hypertension, hyperlipidemia, osteoarthritis. 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 Patient was screened for COVID-19 and was negative.  Chest x-ray also showed left lower lobe consolidation concerning for pneumonia.  Subjective: sitting in chair in radiology suite Assessment / Plan / Recommendation CHL IP CLINICAL IMPRESSIONS 08/20/2018 Clinical Impression Patient presents with a primary structural dysphagia from cervical osterophytes that are pushing into pharynx and resulting in restricted transit of bolus through UES. Patient also with mild-mod oropharyngeal dysphagia with inabilty to orally transit barium tablet, swallow initiation delays to vallecular sinus. Instance of penetration (that fully cleared laryngeal vestibule) occured after the swallow and likely a result of buildup of pyriform and vallecular sinus residuals. Patient able to solid  residuals with sips of plain water (not barium) but likely continued with both vallecular and pyriform sinus residuals. Patient unable to perform chin tuck despite demonstration.  SLP Visit Diagnosis Dysphagia, oropharyngeal phase (R13.12) Attention and concentration deficit following -- Frontal lobe and executive function deficit following -- Impact on safety and function Moderate aspiration risk;Mild aspiration risk   CHL IP TREATMENT RECOMMENDATION 08/20/2018 Treatment Recommendations Therapy as outlined in treatment plan below   Prognosis 08/20/2018 Prognosis for Safe Diet Advancement Fair Barriers to Reach Goals -- Barriers/Prognosis Comment -- CHL IP DIET RECOMMENDATION 08/20/2018 SLP Diet Recommendations Dysphagia 3 (Mech soft) solids;Thin liquid Liquid Administration via Cup;Straw Medication Administration Crushed with puree Compensations Minimize environmental distractions;Slow rate;Small  sips/bites;Follow solids with liquid Postural Changes Seated upright at 90 degrees;Remain semi-upright after after feeds/meals (Comment)   CHL IP OTHER RECOMMENDATIONS 08/20/2018 Recommended Consults --  Oral Care Recommendations Oral care BID Other Recommendations --   CHL IP FOLLOW UP RECOMMENDATIONS 08/20/2018 Follow up Recommendations None   CHL IP FREQUENCY AND DURATION 08/20/2018 Speech Therapy Frequency (ACUTE ONLY) min 2x/week Treatment Duration 1 week      CHL IP ORAL PHASE 08/20/2018 Oral Phase Impaired Oral - Pudding Teaspoon -- Oral - Pudding Cup -- Oral - Honey Teaspoon -- Oral - Honey Cup -- Oral - Nectar Teaspoon -- Oral - Nectar Cup Delayed oral transit Oral - Nectar Straw -- Oral - Thin Teaspoon -- Oral - Thin Cup WFL Oral - Thin Straw WFL Oral - Puree Lingual pumping;Weak lingual manipulation;Delayed oral transit Oral - Mech Soft Weak lingual manipulation;Lingual pumping;Delayed oral transit Oral - Regular -- Oral - Multi-Consistency -- Oral - Pill Weak lingual manipulation;Reduced posterior propulsion;Other (Comment) Oral Phase - Comment --  CHL IP PHARYNGEAL PHASE 08/20/2018 Pharyngeal Phase Impaired Pharyngeal- Pudding Teaspoon -- Pharyngeal -- Pharyngeal- Pudding Cup -- Pharyngeal -- Pharyngeal- Honey Teaspoon -- Pharyngeal -- Pharyngeal- Honey Cup -- Pharyngeal -- Pharyngeal- Nectar Teaspoon -- Pharyngeal -- Pharyngeal- Nectar Cup -- Pharyngeal -- Pharyngeal- Nectar Straw -- Pharyngeal -- Pharyngeal- Thin Teaspoon -- Pharyngeal -- Pharyngeal- Thin Cup Delayed swallow initiation-vallecula;Pharyngeal residue - valleculae;Pharyngeal residue - pyriform;Pharyngeal residue - posterior pharnyx Pharyngeal -- Pharyngeal- Thin Straw Pharyngeal residue - valleculae;Pharyngeal residue - pyriform;Pharyngeal residue - posterior pharnyx;Penetration/Aspiration during swallow Pharyngeal Material enters airway, remains ABOVE vocal cords then ejected out Pharyngeal- Puree Pharyngeal residue - valleculae;Pharyngeal  residue - pyriform;Pharyngeal residue - posterior pharnyx Pharyngeal -- Pharyngeal- Mechanical Soft Pharyngeal residue - valleculae;Pharyngeal residue - pyriform;Pharyngeal residue - posterior pharnyx;Reduced tongue base retraction Pharyngeal -- Pharyngeal- Regular -- Pharyngeal -- Pharyngeal- Multi-consistency -- Pharyngeal -- Pharyngeal- Pill -- Pharyngeal -- Pharyngeal Comment --  CHL IP CERVICAL ESOPHAGEAL PHASE 08/20/2018 Cervical Esophageal Phase Impaired Pudding Teaspoon -- Pudding Cup -- Honey Teaspoon -- Honey Cup -- Nectar Teaspoon -- Nectar Cup Reduced cricopharyngeal relaxation Nectar Straw -- Thin Teaspoon -- Thin Cup Reduced cricopharyngeal relaxation Thin Straw Reduced cricopharyngeal relaxation Puree Reduced cricopharyngeal relaxation Mechanical Soft Reduced cricopharyngeal relaxation Regular -- Multi-consistency -- Pill -- Cervical Esophageal Comment Patient with prominent cervical osteophytes that are pushing into pharynx and likely resulting in restricted bolus transit through UES Dannial Monarch 08/20/2018, 6:07 PM    Sonia Baller, MA, CCC-SLP Speech Therapy [facility] Pager: [phone]          2D echocardiogram with bubble 1. The left ventricle has normal systolic function, with an ejection fraction of 60-65%. The cavity size was normal. There is mildly increased left ventricular wall thickness. Left ventricular diastolic function could not be evaluated.  2. The right ventricle has normal systolc function. The cavity was normal. There is no increase in right ventricular wall thickness.  3. Left atrial size was mildly dilated.  4. Trivial pericardial effusion is present.  5. No evidence of mitral valve stenosis. Trivial mitral regurgitation.  6. The aortic valve is tricuspid No stenosis of the aortic valve.  7. The aortic root is normal in size and structure.  8. Negative bubble study, no evidence for PFO/ASD.   PHYSICAL EXAM: Frail elderly Asian male in distress due to  left leg pain and yelling in pains. . Afebrile. Head is nontraumatic. Neck is supple without bruit.    Cardiac exam no murmur or gallop. Lungs are clear to auscultation. Distal pulses are well felt.Left knee and thigh have mild swelling and discoloration and tenderness to touch Neurological Exam :  Limited due to  Language barrier  He is awake alert.   Extraocular movements are full range.  He blinks to threat on the right but not on the left.  Left lower facial weakness.  Tongue midline.  Motor system exam shows left hemiplegia with hypotonia.  Normal strength on the right.  Sensation appears preserved bilaterally.  Right plantar downgoing left equivocal.  Gait not tested ASSESSMENT/PLAN [name] is a 73 y.o. male with history of , hypercholesterolemia, food allergy, arthritis and eczema presenting with L sided weakness and confusion w/ myalgias and arthralgias over the past few weeks and weight loss since Jan 2020. MRI revealed scattered L cerebellum and B cerebral infarcts.    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  Old left posterior parietal lobe infarct.  Atrophy.  Small vessel disease.  Encephalomalacia L> R frontal lobe.  MRI scattered L cerebellar and B cerebral hemisphere infarcts (R ACA, R PCA, L MCA).  Old L cingulate gyrus infarct.  Small petechial hemorrhage right medial parietal  CTA head & neck acute/subacute R ACA, R occipital, L parietal, L cerebellar infarcts.  No hemorrhage.  Mild B ICA, B VA atherosclerosis.  Severe R A2 & R PCA disease.  Carotid Doppler  B ICA 1-39% stenosis, VAs antegrade   2D Echo EF 60-65 %. No source of embolus.  Bubble study negative  Lower extremity Doppler negative DVT  LDL 84  HgbA1c 4.9  Lovenox 30 mg sq daily for VTE prophylaxis  No antithrombotic prior to admission, now on aspirin 325 mg  daily.  Changed to Eliquis as A. fib confirmed  Therapy recommendations: SNF  Disposition: Pending NOTHING FURTHER TO ADD FROM THE STROKE STANDPOINT  Patient has a 10-15% risk of having another stroke over the next year, the highest risk is within 2 weeks of the most recent stroke/TIA (risk of having a stroke following a stroke or TIA is the same).  Ongoing stroke risk factor control by Primary Care Physician or MD at SNF at d/c  Stroke Service will sign off. Please call should any needs arise.  Follow-up Stroke Clinic at [facility] in 4 weeks, order placed.  Atrial Fibrillation, new dx  Confirmed by EP  Change aspirin to eliquis . Continue Eliquis (apixaban) daily at discharge   Hyperlipidemia  Home meds: No statin  Now on Lipitor 40 mg daily  LDL 84, goal < 70  Continue statin at discharge  Other Stroke Risk Factors  Advanced age  Former cigarette smoker, quit 13 years ago   Other Active Problems  L knee pain. Left leg swelling.  Dopplers negative for DVT.   added pain med.   Weight loss/elevated AST/ALT.seen by GI as an outpatient with rheumatology appointment end of April  Anemia, outpatient work-up in progress  Moderately severe protein calorie malnutrition, question malignancy  Left lower lobe pneumonia, community-acquired, on antibiotics  Hyponatremia  Left leg pain/ swelling/discoloration  ?? myositis  Hospital day # 3  .  I had a long discussion with Dr. Broadus John about the patient left leg pain swelling and discoloration which may represent myositis exact etiology unclear.  In view of elevated inflammatory parameters it would not be unreasonable to try a short course of steroids.  Also NSAIDs and pain medications.  Atrial fibrillation has been confirmed by the electrophysiology team on cardiac monitor and hence recommend Eliquis for  stroke prevention.  Continue ongoing therapies.  Aggressive risk factor modification.  Stroke team  will sign off.  Kindly call for questions.  Discussed with Dr. Broadus John.  Greater than 50% time during this 25-minute visit was spent on counseling and coordination of care about his atrial fibrillation, stroke, left leg pain and swelling and answering questions and discussion with care team  Antony Contras, MD Medical Director [facility] Pager: [phone] 08/21/2018 2:21 PM   To contact Stroke Continuity provider, please refer to [email or web address] After hours, contact General Neurology

## 2018-08-21 NOTE — Progress Notes (Signed)
ANTICOAGULATION CONSULT NOTE - Follow Up Consult  Pharmacy Consult for Eliquis Indication: atrial fibrillation  No Known Allergies  Patient Measurements: Height: 5\' 3"  (160 cm) Weight: 110 lb 0.2 oz (49.9 kg) IBW/kg (Calculated) : 56.9  Vital Signs: Temp: 98.5 F (36.9 C) (04/30 1156) Temp Source: Oral (04/30 1156) BP: 120/47 (04/30 1156) Pulse Rate: 85 (04/30 1156)  Labs: Recent Labs    08/19/18 0532 08/20/18 0445 08/20/18 0940  HGB 8.7*  --  9.9*  HCT 26.7*  --  31.6*  PLT 418*  --  411*  CREATININE 0.72 0.72  --     Estimated Creatinine Clearance: 57.2 mL/min (by C-G formula based on SCr of 0.72 mg/dL).   Assessment: 73 year old male to begin Eliquis for Afib Scr stable, Weight < 60 kg, Age < 80  Goal of Therapy:  Monitor platelets by anticoagulation protocol: Yes   Plan:  Eliquis 5 mg po BID  Thank you Okey Regal, PharmD [phone]  08/21/2018,2:41 PM

## 2018-08-21 NOTE — Care Management Important Message (Signed)
Important Message  Patient Details  Name: Nathan Bruce MRN: [id number] Date of Birth: Jan 05, 1946   Medicare Important Message Given:  Yes    Boomer Winders Stefan Church 08/21/2018, 1:47 PM

## 2018-08-21 NOTE — TOC Progression Note (Signed)
Transition of Care [facility]) - Progression Note    Patient Details  Name: Nathan Bruce MRN: [id number] Date of Birth: 1946/01/04  Transition of Care [facility]) CM/SW Contact  Baldemar Lenis, [place] Phone Number: 08/21/2018, 2:34 PM  Clinical Narrative:  CSW alerted by MD that plan is now to take patient home, not SNF. CSW spoke with patient's daughter to confirm plan to return home and discuss discharge needs. Daughter agreeable to equipment to assist (patient already has a wheelchair, would need hospital bed and lift) but concerned about cost. Daughter also agreeable to home health, wants to see list of agencies. CSW emailed list to daughter and alerted RNCM of daughter's questions about equipment costs.     Expected Discharge Plan: Home w Home Health Services Barriers to Discharge: Continued Medical Work up  Expected Discharge Plan and Services Expected Discharge Plan: Home w Home Health Services   Discharge Planning Services: CM Consult Post Acute Care Choice: Durable Medical Equipment, Home Health Living arrangements for the past 2 months: Single Family Home                 DME Arranged: Hospital bed, Other see comment(hoyer lift) DME Agency: ([facility] [employer] [facility]) Date DME Agency Contacted: 08/21/18 Time DME Agency Contacted: [phone] Representative spoke with at DME Agency: Selena Batten             Social Determinants of Health (SDOH) Interventions    Readmission Risk Interventions No flowsheet data found.

## 2018-08-21 NOTE — TOC Progression Note (Signed)
Transition of Care [facility]) - Progression Note    Patient Details  Name: ANDREIS MANLEY MRN: [id number] Date of Birth: [birth year]  Transition of Care [facility]) CM/SW Contact  Kermit Balo, RN Phone Number: 08/21/2018, 2:22 PM  Clinical Narrative:    Daughter has decided to take the patient home. Pt is 2+ assist. She has her brother and mother at the home to assist.  Pt with orders for hospital bed and hoyer lift. CM spoke with the daughter and [facility] decided on.  CM will reach out to daughter in the am to get decision for [facility] services and arranged transport home for the patient.    Expected Discharge Plan: Skilled Nursing Facility Barriers to Discharge: Continued Medical Work up  Expected Discharge Plan and Services Expected Discharge Plan: Skilled Nursing Facility     Post Acute Care Choice: Durable Medical Equipment, Home Health Living arrangements for the past 2 months: Single Family Home                 DME Arranged: Hospital bed, Other see comment(hoyer lift) DME Agency: ([facility] [employer] [facility]) Date DME Agency Contacted: 08/21/18 Time DME Agency Contacted: [phone] Representative spoke with at DME Agency: Selena Batten             Social Determinants of Health (SDOH) Interventions    Readmission Risk Interventions No flowsheet data found.

## 2018-08-21 NOTE — Progress Notes (Signed)
Progress Note    JABER DUNLOW  MAU:[id number] DOB: 07/27/1945  DOA: 08/17/2018 PCP: Wendie Agreste, MD    Brief Narrative:  Medical records reviewed and are as summarized below: Jonmichael BLANDON OFFERDAHL is an 73 y.o. male admitted with progressive weakness and confusion.  Found to have CVA and LLPNA.  He also has left leg swelling.  Per chart review has had progressive weight loss of > 20 lbs in last 3 months.  Also developed severe "eczema" on BL LE in 2019-- biopsy done by dermatology in 2019-- ? Mite vs eczema on pathology  Assessment/Plan:   Acute CVA:  -MRI: Scattered acute infarctions affecting the left cerebellum and both cerebral hemispheres. The pattern is most consistent with embolic disease from the heart or ascending aorta.  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 Amil Amen at [facility]) -work was ongoing as outpatient prior to admission -ANA negative -CCP +(weak positive at 22), ESR 60 -EGD/colonscopy was postponed due to intolerance of prep -start Prednisone 20m daily and FU with Rheum  Anemia -appears Fe def per old labs -outpatient work up in progress   moderately severe protein calorie malnutrition:  -likley due to undiagnosed connective tissue d/o vs malignancy -Outpatient GI and Rheum workup ongoing   left lower lobe pneumonia:  -Appeared to be community-acquired -Covid 19 PCR negative -changed ceftriaxone and azithromycin to cefdinir for 5days  Hyponatremia -improved with IVF  Family Communication/Anticipated D/C date and plan/Code Status   DVT prophylaxis: Lovenox  ordered. Code Status: Full Code.  Family Communication: no family at beside, called and updated daughter [facility]  Medical Consultants:    Neuro      Subjective:  -complains of leg pains  Objective:    Vitals:   08/20/18 2345 08/21/18 0407 08/21/18 1156 08/21/18 1536  BP: 112/60 115/65 (!) 120/47 (!) 108/45  Pulse: 75 80 85 78  Resp: '16 18 18 18  ' Temp: 98.3 F (36.8 C) 98.5 F (36.9 C) 98.5 F (36.9 C) 98 F (36.7 C)  TempSrc: Axillary Oral Oral Oral  SpO2: 100% 98% 97% 98%  Weight:      Height:        Intake/Output Summary (Last 24 hours) at 08/21/2018 1540 Last data filed at 08/21/2018 1418 Gross per 24 hour  Intake 550 ml  Output --  Net 550 ml   Filed Weights   08/17/18 2223 08/18/18 0251  Weight: 54.4 kg 49.9 kg    Exam:  Gen: Awake, Alert, oriented to self only  HEENT: PERRLA, Neck supple, no JVD Lungs: CTAB, few ronchi at L base CVS: RRR,No Gallops,Rubs or new Murmurs Abd: soft, Non tender, non distended, BS present Skin: diffuse eczematous rash all over arms and legs Neuro: poor effort, L hemiplegia  Data Reviewed:   I have personally reviewed following labs and imaging studies:  Labs: Labs show the following:   Basic Metabolic Panel: Recent Labs  Lab 08/17/18 2234 08/18/18 0453 08/18/18 1055 08/19/18 0532 08/20/18 0445  NA 128*  --  134* 134* 134*  K 3.9  --  3.8 4.5 4.5  CL 99  --  101 103 103  CO2 24  --  23 21* 20*  GLUCOSE 201*  --  113* 94 99  BUN 11  --  '8 10 13  ' CREATININE 0.70 0.69 0.63 0.72 0.72  CALCIUM 7.9*  --  7.7* 7.6* 7.7*   GFR Estimated Creatinine Clearance: 57.2 mL/min (by C-G formula based on SCr of 0.72 mg/dL). Liver Function Tests: Recent Labs  Lab 08/17/18 2234 08/20/18 0445  AST 132* 143*  ALT 88* 72*  ALKPHOS 47 56  BILITOT 0.6 1.0  PROT 5.9* 5.5*  ALBUMIN 2.0* 1.8*   No results for input(s): LIPASE, AMYLASE in the last 168 hours. No results for  input(s): AMMONIA in the last 168 hours. Coagulation profile No results for input(s): INR, PROTIME in the last 168 hours.  CBC: Recent Labs  Lab 08/17/18 2234 08/18/18 0453 08/18/18 1055 08/19/18 0532 08/20/18 0940  WBC 9.8 10.0  --  8.3 8.9  HGB 9.0* 7.8* 8.2* 8.7* 9.9*  HCT 27.9* 23.8* 25.5* 26.7* 31.6*  MCV 72.3* 69.8*  --  70.8* 71.5*  PLT 430* 475*  --  418* 411*   Cardiac Enzymes: No results for input(s): CKTOTAL, CKMB, CKMBINDEX, TROPONINI in the last 168 hours. BNP (last 3 results) No results for input(s): PROBNP in the last 8760 hours. CBG: No results for input(s): GLUCAP in the last 168 hours. D-Dimer: No results for input(s): DDIMER in the last 72 hours. Hgb A1c: No results for input(s): HGBA1C in the last 72 hours. Lipid Profile: No results for input(s): CHOL, HDL, LDLCALC, TRIG, CHOLHDL, LDLDIRECT in the last 72 hours. Thyroid function studies: Recent Labs    08/19/18 1134  TSH 2.034   Anemia work up: No results for input(s): VITAMINB12, FOLATE, FERRITIN, TIBC, IRON, RETICCTPCT in the last 72 hours. Sepsis Labs: Recent Labs  Lab 08/17/18 2234 08/18/18 0453 08/19/18 0532 08/20/18 0940  WBC 9.8 10.0 8.3 8.9    Microbiology Recent Results (from the past 240 hour(s))  SARS Coronavirus 2 [facility] order, Performed in [facility] hospital lab)     Status: None   Collection Time: 08/18/18 12:01 AM  Result Value Ref Range Status   SARS Coronavirus 2 NEGATIVE NEGATIVE Final    Comment: (NOTE) If result is NEGATIVE SARS-CoV-2 target nucleic acids are NOT DETECTED. The SARS-CoV-2 RNA is generally detectable in upper and lower  respiratory specimens during the acute phase of infection. The lowest  concentration of SARS-CoV-2 viral copies this assay can detect is 250  copies / mL. A negative result does not preclude SARS-CoV-2 infection  and should not be used as the sole basis for treatment or other  patient management decisions.  A negative result may  occur with  improper specimen collection / handling, submission of specimen other  than nasopharyngeal swab, presence of viral mutation(s) within the  areas targeted by this assay, and inadequate number of viral copies  (<250 copies / mL). A negative result must be combined with clinical  observations, patient history, and epidemiological information. If result is POSITIVE SARS-CoV-2 target nucleic acids are DETECTED. The SARS-CoV-2 RNA is generally detectable in upper and lower  respiratory specimens dur ing the acute phase of infection.  Positive  results are indicative of active infection with SARS-CoV-2.  Clinical  correlation with patient history and other diagnostic information is  necessary to determine patient infection status.  Positive results do  not rule out bacterial infection or co-infection with other viruses. If result is PRESUMPTIVE POSTIVE SARS-CoV-2 nucleic acids MAY  BE PRESENT.   A presumptive positive result was obtained on the submitted specimen  and confirmed on repeat testing.  While 2019 novel coronavirus  (SARS-CoV-2) nucleic acids may be present in the submitted sample  additional confirmatory testing may be necessary for epidemiological  and / or clinical management purposes  to differentiate between  SARS-CoV-2 and other Sarbecovirus currently known to infect humans.  If clinically indicated additional testing with an alternate test  methodology [phone]) is advised. The SARS-CoV-2 RNA is generally  detectable in upper and lower respiratory sp ecimens during the acute  phase of infection. The expected result is Negative. Fact Sheet for Patients:  StrictlyIdeas.no Fact Sheet for Healthcare Providers: BankingDealers.co.za This test is not yet approved or cleared by the [place] FDA and has been authorized for detection and/or diagnosis of SARS-CoV-2 by FDA under an Emergency Use Authorization (EUA).  This  EUA will remain in effect (meaning this test can be used) for the duration of the COVID-19 declaration under Section 564(b)(1) of the Act, 21 U.S.C. section 360bbb-3(b)(1), unless the authorization is terminated or revoked sooner. Performed at [facility], [place] [address], [place], [place] [address]     Procedures and diagnostic studies:  Dg Swallowing Func-speech Pathology  Result Date: 08/20/2018 . Objective Swallowing Evaluation: Type of Study: MBS-Modified Barium Swallow Study  Patient Details Name: TRAYLON SCHIMMING MRN: [id number] Date of Birth: [birth year] Today's Date: 08/20/2018 Time: SLP Start Time (ACUTE ONLY): 1410 -SLP Stop Time (ACUTE ONLY): 1430 SLP Time Calculation (min) (ACUTE ONLY): 20 min Past Medical History: Past Medical History: Diagnosis Date  Arthritis   knees  Food allergy   Shellfish allergy  Hypercholesterolemia   Hypertension  Past Surgical History: Past Surgical History: Procedure Laterality Date  APPENDECTOMY    CATARACT EXTRACTION N/A   I&D EXTREMITY Left 08/22/2013  Procedure: IRRIGATION AND DEBRIDEMENT EXTREMITY;  Surgeon: Tennis Must, MD;  Location: [facility];  Service: Orthopedics;  Laterality: Left;  left ring finger  LUMBAR [facility] SURGERY  2011  in [place]   PERCUTANEOUS PINNING Left 08/22/2013  Procedure: PERCUTANEOUS PINNING EXTREMITY;  Surgeon: Tennis Must, MD;  Location: [facility];  Service: Orthopedics;  Laterality: Left;  left ring finger  TOOTH EXTRACTION   HPI: Patient is a 73 y/o male presenting to the ED on 08/17/2018 with primary complaints of L sided weakness and AMS. PMH significant of hypertension, hyperlipidemia, osteoarthritis. 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 Patient was screened for COVID-19 and was negative.  Chest x-ray also showed left lower lobe consolidation concerning for pneumonia.  Subjective: sitting in chair in radiology suite Assessment / Plan /  Recommendation CHL IP CLINICAL IMPRESSIONS 08/20/2018 Clinical Impression Patient presents with a primary structural dysphagia from cervical osterophytes that are pushing into pharynx and resulting in restricted transit of bolus through UES. Patient also with mild-mod oropharyngeal dysphagia with inabilty to orally transit barium tablet, swallow initiation delays to vallecular sinus. Instance of penetration (that fully cleared laryngeal vestibule) occured after the swallow and likely a result of buildup of pyriform and vallecular sinus residuals. Patient able to solid  residuals with sips of plain water (not barium) but likely continued with both vallecular and pyriform sinus residuals. Patient unable to perform chin tuck despite demonstration.  SLP Visit Diagnosis Dysphagia, oropharyngeal phase (R13.12) Attention and concentration deficit following -- Frontal lobe and executive function deficit following -- Impact on safety and function Moderate aspiration  risk;Mild aspiration risk   CHL IP TREATMENT RECOMMENDATION 08/20/2018 Treatment Recommendations Therapy as outlined in treatment plan below   Prognosis 08/20/2018 Prognosis for Safe Diet Advancement Fair Barriers to Reach Goals -- Barriers/Prognosis Comment -- CHL IP DIET RECOMMENDATION 08/20/2018 SLP Diet Recommendations Dysphagia 3 (Mech soft) solids;Thin liquid Liquid Administration via Cup;Straw Medication Administration Crushed with puree Compensations Minimize environmental distractions;Slow rate;Small sips/bites;Follow solids with liquid Postural Changes Seated upright at 90 degrees;Remain semi-upright after after feeds/meals (Comment)   CHL IP OTHER RECOMMENDATIONS 08/20/2018 Recommended Consults -- Oral Care Recommendations Oral care BID Other Recommendations --   CHL IP FOLLOW UP RECOMMENDATIONS 08/20/2018 Follow up Recommendations None   CHL IP FREQUENCY AND DURATION 08/20/2018 Speech Therapy Frequency (ACUTE ONLY) min 2x/week Treatment Duration 1 week       CHL IP ORAL PHASE 08/20/2018 Oral Phase Impaired Oral - Pudding Teaspoon -- Oral - Pudding Cup -- Oral - Honey Teaspoon -- Oral - Honey Cup -- Oral - Nectar Teaspoon -- Oral - Nectar Cup Delayed oral transit Oral - Nectar Straw -- Oral - Thin Teaspoon -- Oral - Thin Cup WFL Oral - Thin Straw WFL Oral - Puree Lingual pumping;Weak lingual manipulation;Delayed oral transit Oral - Mech Soft Weak lingual manipulation;Lingual pumping;Delayed oral transit Oral - Regular -- Oral - Multi-Consistency -- Oral - Pill Weak lingual manipulation;Reduced posterior propulsion;Other (Comment) Oral Phase - Comment --  CHL IP PHARYNGEAL PHASE 08/20/2018 Pharyngeal Phase Impaired Pharyngeal- Pudding Teaspoon -- Pharyngeal -- Pharyngeal- Pudding Cup -- Pharyngeal -- Pharyngeal- Honey Teaspoon -- Pharyngeal -- Pharyngeal- Honey Cup -- Pharyngeal -- Pharyngeal- Nectar Teaspoon -- Pharyngeal -- Pharyngeal- Nectar Cup -- Pharyngeal -- Pharyngeal- Nectar Straw -- Pharyngeal -- Pharyngeal- Thin Teaspoon -- Pharyngeal -- Pharyngeal- Thin Cup Delayed swallow initiation-vallecula;Pharyngeal residue - valleculae;Pharyngeal residue - pyriform;Pharyngeal residue - posterior pharnyx Pharyngeal -- Pharyngeal- Thin Straw Pharyngeal residue - valleculae;Pharyngeal residue - pyriform;Pharyngeal residue - posterior pharnyx;Penetration/Aspiration during swallow Pharyngeal Material enters airway, remains ABOVE vocal cords then ejected out Pharyngeal- Puree Pharyngeal residue - valleculae;Pharyngeal residue - pyriform;Pharyngeal residue - posterior pharnyx Pharyngeal -- Pharyngeal- Mechanical Soft Pharyngeal residue - valleculae;Pharyngeal residue - pyriform;Pharyngeal residue - posterior pharnyx;Reduced tongue base retraction Pharyngeal -- Pharyngeal- Regular -- Pharyngeal -- Pharyngeal- Multi-consistency -- Pharyngeal -- Pharyngeal- Pill -- Pharyngeal -- Pharyngeal Comment --  CHL IP CERVICAL ESOPHAGEAL PHASE 08/20/2018 Cervical Esophageal Phase Impaired  Pudding Teaspoon -- Pudding Cup -- Honey Teaspoon -- Honey Cup -- Nectar Teaspoon -- Nectar Cup Reduced cricopharyngeal relaxation Nectar Straw -- Thin Teaspoon -- Thin Cup Reduced cricopharyngeal relaxation Thin Straw Reduced cricopharyngeal relaxation Puree Reduced cricopharyngeal relaxation Mechanical Soft Reduced cricopharyngeal relaxation Regular -- Multi-consistency -- Pill -- Cervical Esophageal Comment Patient with prominent cervical osteophytes that are pushing into pharynx and likely resulting in restricted bolus transit through UES Dannial Monarch 08/20/2018, 6:07 PM    Sonia Baller, MA, CCC-SLP Speech Therapy [facility] Acute Rehab Pager: [phone]           Medications:    apixaban  5 mg Oral BID   atorvastatin  40 mg Oral q1800   cefdinir  300 mg Oral Q12H   doxepin  25 mg Oral QHS   furosemide  20 mg Oral Daily   metoprolol tartrate  12.5 mg Oral BID   multivitamin with minerals  1 tablet Oral Daily   pantoprazole  40 mg Oral Daily   predniSONE  20 mg Oral Q breakfast   vitamin B-12  500 mcg Oral Daily   Continuous Infusions:  LOS: 3 days   [facility]   08/21/2018, 3:40 PM

## 2018-08-21 NOTE — Plan of Care (Signed)
Progressing towards goals

## 2018-08-21 NOTE — Care Management (Signed)
    Durable Medical Equipment  (From admission, onward)         Start     Ordered   08/21/18 1223  For home use only DME Hospital bed  Once    Question Answer Comment  Patient has (list medical condition): Embolic stroke   The above medical condition requires: Patient requires the ability to reposition frequently   Head must be elevated greater than: 45 degrees   Bed type Semi-electric      08/21/18 1223

## 2018-08-22 ENCOUNTER — Ambulatory Visit: Payer: Medicare Other

## 2018-08-22 MED ORDER — METOPROLOL TARTRATE 25 MG PO TABS: 12.5000 mg | ORAL_TABLET | Freq: Two times a day (BID) | ORAL | 0 refills | Status: AC

## 2018-08-22 MED ORDER — APIXABAN 5 MG PO TABS: 5.0000 mg | ORAL_TABLET | Freq: Two times a day (BID) | ORAL | 0 refills | Status: DC

## 2018-08-22 MED ORDER — PREDNISONE 20 MG PO TABS: 20.0000 mg | ORAL_TABLET | Freq: Every day | ORAL | 0 refills | Status: DC

## 2018-08-22 MED ORDER — AMOXICILLIN 500 MG PO CAPS: 500.0000 mg | ORAL_CAPSULE | Freq: Three times a day (TID) | ORAL | 0 refills | Status: AC

## 2018-08-22 MED ORDER — ATORVASTATIN CALCIUM 40 MG PO TABS: 40.0000 mg | ORAL_TABLET | Freq: Every day | ORAL | 0 refills | Status: AC

## 2018-08-22 NOTE — Progress Notes (Signed)
[facility] arrived to transport patient to home. Daughter Nathan Bruce notified of pickup, reports that family is available for arrival of patient.

## 2018-08-22 NOTE — Plan of Care (Signed)
Progressing towards goals

## 2018-08-22 NOTE — TOC Transition Note (Signed)
Transition of Care [facility]) - CM/SW Discharge Note   Patient Details  Name: Nathan Bruce MRN: [id number] Date of Birth: 1945-10-16  [facility]) CM/SW Contact:  Kermit Balo, RN Phone Number: 08/22/2018, 4:16 PM   Clinical Narrative:    Pt discharging home with family. CM spoke to daughter and the bed and lift have been delivered to the home. She is asking for [facility] home. CM has called [facility] and updated the bedside RN. Transport packet at [employer].   Final next level of care: Home w Home Health Services Barriers to Discharge: Barriers Resolved   Patient Goals and CMS Choice Patient states their goals for this hospitalization and ongoing recovery are:: patient unable to participate in goal setting; daughter wants the patient to get better CMS Medicare.gov Compare Post Acute Care list provided to:: Patient Represenative (must comment) Choice offered to / list presented to : Adult Children  Discharge Placement                       Discharge Plan and Services   Discharge Planning Services: CM Consult Post Acute Care Choice: Durable Medical Equipment, Home Health          DME Arranged: Hospital bed, Other see comment(hoyer lift) DME Agency: ([facility]) Date DME Agency Contacted: 08/21/18 Time DME Agency Contacted: [phone] Representative spoke with at DME Agency: Tawanna Cooler Arranged: PT, OT, Speech Therapy, Social Work [facility] Agency: [facility] Date HH Agency Contacted: 08/22/18 Time HH Agency Contacted: [phone] Representative spoke with at [facility] Agency: Alv[facility]  Social Determinants of Health (SDOH) Interventions     Readmission Risk Interventions No flowsheet data found.

## 2018-08-22 NOTE — Plan of Care (Signed)
Patient stable, discussed POC with patient and daughter, agreeable with plan, denies question/concerns at this time.  

## 2018-08-23 ENCOUNTER — Other Ambulatory Visit: Payer: Self-pay

## 2018-08-23 ENCOUNTER — Emergency Department (HOSPITAL_COMMUNITY)
Admission: EM | Admit: 2018-08-23 | Discharge: 2018-08-23 | Disposition: A | Discharge status: Home / Self Care | Payer: Medicare Other | Attending: Emergency Medicine | Admitting: Emergency Medicine

## 2018-08-23 ENCOUNTER — Encounter (HOSPITAL_COMMUNITY): Payer: Self-pay

## 2018-08-23 ENCOUNTER — Emergency Department (HOSPITAL_COMMUNITY): Payer: Medicare Other

## 2018-08-23 ENCOUNTER — Other Ambulatory Visit: Payer: Self-pay | Admitting: Family Medicine

## 2018-08-23 DIAGNOSIS — M25572 Pain in left ankle and joints of left foot: Secondary | ICD-10-CM | POA: Diagnosis not present

## 2018-08-23 DIAGNOSIS — R740 Nonspecific elevation of levels of transaminase and lactic acid dehydrogenase [LDH]: Secondary | ICD-10-CM | POA: Diagnosis not present

## 2018-08-23 DIAGNOSIS — K8020 Calculus of gallbladder without cholecystitis without obstruction: Secondary | ICD-10-CM

## 2018-08-23 DIAGNOSIS — I10 Essential (primary) hypertension: Secondary | ICD-10-CM | POA: Diagnosis not present

## 2018-08-23 DIAGNOSIS — R748 Abnormal levels of other serum enzymes: Secondary | ICD-10-CM

## 2018-08-23 DIAGNOSIS — Z79899 Other long term (current) drug therapy: Secondary | ICD-10-CM | POA: Diagnosis not present

## 2018-08-23 DIAGNOSIS — R1011 Right upper quadrant pain: Secondary | ICD-10-CM

## 2018-08-23 DIAGNOSIS — Z87891 Personal history of nicotine dependence: Secondary | ICD-10-CM | POA: Insufficient documentation

## 2018-08-23 DIAGNOSIS — M25571 Pain in right ankle and joints of right foot: Secondary | ICD-10-CM | POA: Insufficient documentation

## 2018-08-23 DIAGNOSIS — M549 Dorsalgia, unspecified: Secondary | ICD-10-CM

## 2018-08-23 DIAGNOSIS — Z8673 Personal history of transient ischemic attack (TIA), and cerebral infarction without residual deficits: Secondary | ICD-10-CM | POA: Diagnosis not present

## 2018-08-23 DIAGNOSIS — R7401 Elevation of levels of liver transaminase levels: Secondary | ICD-10-CM

## 2018-08-23 HISTORY — DX: Cerebral infarction, unspecified: I63.9

## 2018-08-23 LAB — CBC WITH DIFFERENTIAL/PLATELET
Abs Immature Granulocytes: 0.04 10*3/uL (ref 0.00–0.07)
Basophils Absolute: 0 10*3/uL (ref 0.0–0.1)
Basophils Relative: 0 %
Eosinophils Absolute: 0.1 10*3/uL (ref 0.0–0.5)
Eosinophils Relative: 1 %
HCT: 36 % — ABNORMAL LOW (ref 39.0–52.0)
Hemoglobin: 10.8 g/dL — ABNORMAL LOW (ref 13.0–17.0)
Immature Granulocytes: 0 %
Lymphocytes Relative: 8 %
Lymphs Abs: 0.8 10*3/uL (ref 0.7–4.0)
MCH: 22.5 pg — ABNORMAL LOW (ref 26.0–34.0)
MCHC: 30 g/dL (ref 30.0–36.0)
MCV: 75.2 fL — ABNORMAL LOW (ref 80.0–100.0)
Monocytes Absolute: 0.3 10*3/uL (ref 0.1–1.0)
Monocytes Relative: 3 %
Neutro Abs: 9.3 10*3/uL — ABNORMAL HIGH (ref 1.7–7.7)
Neutrophils Relative %: 88 %
Platelets: 540 10*3/uL — ABNORMAL HIGH (ref 150–400)
RBC: 4.79 MIL/uL (ref 4.22–5.81)
RDW: 19.1 % — ABNORMAL HIGH (ref 11.5–15.5)
WBC: 10.5 10*3/uL (ref 4.0–10.5)
nRBC: 0 % (ref 0.0–0.2)

## 2018-08-23 LAB — COMPREHENSIVE METABOLIC PANEL
ALT: UNDETERMINED U/L (ref 0–44)
AST: 158 U/L — ABNORMAL HIGH (ref 15–41)
Albumin: 1.9 g/dL — ABNORMAL LOW (ref 3.5–5.0)
Alkaline Phosphatase: 72 U/L (ref 38–126)
Anion gap: 12 (ref 5–15)
BUN: 13 mg/dL (ref 8–23)
CO2: 18 mmol/L — ABNORMAL LOW (ref 22–32)
Calcium: 7.9 mg/dL — ABNORMAL LOW (ref 8.9–10.3)
Chloride: 105 mmol/L (ref 98–111)
Creatinine, Ser: 0.64 mg/dL (ref 0.61–1.24)
GFR calc Af Amer: >60 mL/min (ref >60)
GFR calc non Af Amer: >60 mL/min (ref >60)
Glucose, Bld: 113 mg/dL — ABNORMAL HIGH (ref 70–99)
Potassium: 4.9 mmol/L (ref 3.5–5.1)
Sodium: 135 mmol/L (ref 135–145)
Total Bilirubin: UNDETERMINED mg/dL (ref 0.3–1.2)
Total Protein: 5.9 g/dL — ABNORMAL LOW (ref 6.5–8.1)

## 2018-08-23 LAB — CK
Total CK: UNDETERMINED U/L (ref 49–397)
Total CK: 1612 U/L — ABNORMAL HIGH (ref 49–397)

## 2018-08-23 LAB — BILIRUBIN, TOTAL: Total Bilirubin: 0.5 mg/dL (ref 0.3–1.2)

## 2018-08-23 LAB — LACTIC ACID, PLASMA
Lactic Acid, Venous: 2.1 mmol/L (ref 0.5–1.9)
Lactic Acid, Venous: 1.3 mmol/L (ref 0.5–1.9)

## 2018-08-23 LAB — ALT: ALT: 98 U/L — ABNORMAL HIGH (ref 0–44)

## 2018-08-23 LAB — MAGNESIUM
Magnesium: UNDETERMINED mg/dL (ref 1.7–2.4)
Magnesium: 1.9 mg/dL (ref 1.7–2.4)

## 2018-08-23 MED ORDER — OXYCODONE HCL 5 MG PO TABS: 5.0000 mg | ORAL_TABLET | ORAL | 0 refills | Status: DC | PRN

## 2018-08-23 MED ORDER — PREDNISONE 20 MG PO TABS: 40.0000 mg | ORAL_TABLET | Freq: Every day | ORAL | 0 refills | Status: DC

## 2018-08-23 MED ORDER — ACETAMINOPHEN 500 MG PO TABS
1000.0000 mg | ORAL_TABLET | Freq: Once | ORAL | Status: AC
  Administered 2018-08-23: 10:00:00 1000 mg via ORAL
  Filled 2018-08-23: qty 2

## 2018-08-23 MED ORDER — OXYCODONE-ACETAMINOPHEN 5-325 MG PO TABS
2.0000 | ORAL_TABLET | Freq: Once | ORAL | Status: AC
  Administered 2018-08-23: 2 via ORAL
  Filled 2018-08-23: qty 2

## 2018-08-23 MED ORDER — SODIUM CHLORIDE 0.9 % IV BOLUS
500.0000 mL | Freq: Once | INTRAVENOUS | Status: AC
  Administered 2018-08-23: 12:00:00 500 mL via INTRAVENOUS

## 2018-08-23 MED ORDER — METHYLPREDNISOLONE SODIUM SUCC 125 MG IJ SOLR
125.0000 mg | Freq: Once | INTRAMUSCULAR | Status: AC
  Administered 2018-08-23: 14:00:00 125 mg via INTRAVENOUS
  Filled 2018-08-23: qty 2

## 2018-08-23 MED ORDER — DOCUSATE SODIUM 100 MG PO CAPS: 100.0000 mg | ORAL_CAPSULE | Freq: Two times a day (BID) | ORAL | 0 refills | Status: AC

## 2018-08-23 MED ORDER — MORPHINE SULFATE (PF) 4 MG/ML IV SOLN: 4.0000 mg | Freq: Once | INTRAVENOUS | Status: DC

## 2018-08-23 MED ORDER — ONDANSETRON HCL 4 MG/2ML IJ SOLN
4.0000 mg | Freq: Once | INTRAMUSCULAR | Status: AC
  Administered 2018-08-23: 4 mg via INTRAVENOUS
  Filled 2018-08-23: qty 2

## 2018-08-23 MED ORDER — MORPHINE SULFATE (PF) 4 MG/ML IV SOLN
5.0000 mg | Freq: Once | INTRAVENOUS | Status: AC
  Administered 2018-08-23: 5 mg via INTRAVENOUS
  Filled 2018-08-23: qty 2

## 2018-08-23 MED ORDER — SODIUM CHLORIDE 0.9 % IV BOLUS
500.0000 mL | Freq: Once | INTRAVENOUS | Status: AC
  Administered 2018-08-23: 500 mL via INTRAVENOUS

## 2018-08-23 MED ORDER — IOHEXOL 300 MG/ML  SOLN
100.0000 mL | Freq: Once | INTRAMUSCULAR | Status: AC | PRN
  Administered 2018-08-23: 100 mL via INTRAVENOUS

## 2018-08-23 NOTE — Care Management (Signed)
Notified by Eugenio Hoes that patient is active for home health with [facility].

## 2018-08-23 NOTE — ED Notes (Signed)
Patient transported to Ultrasound 

## 2018-08-23 NOTE — ED Notes (Signed)
Pt pulled out IV. Attempted to place another IV without success.

## 2018-08-23 NOTE — ED Provider Notes (Signed)
[facility] EMERGENCY DEPARTMENT Provider Note   CSN: [id number] Arrival date & time: 08/23/18  0908    History   Chief Complaint No chief complaint on file.   HPI Nathan Bruce is a 73 y.o. male.     HPI   73 yo M with h/o HTN, HLD, CVA, ? Rheumatoid vs inflammatory arthritis here w/ bilateral leg pain. 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 He had c/o left>right leg pain during that hospitalization and was given prednisone, had neg U/S. Pt returned home yesterday. Daughter provides most history as pt speaks limited dialect of Viatnamese, but reports his strength is improving actually but he began c/o severe left > right leg and ankle pain last night. He had swelling noted in his ankles. He cried of pain all night, was subsequently sent back to ED. He denies any HA. No neck pain, back pain. No fever or chills. No joint warmth. He reportedly has a h/o chronic pain and has been on meds in past, for "arthritis." He has a Rheum appt in future but does not currently see anyone. Pain is aching, throbbing, worse w/ movement. No alleviating factors.  Past Medical History:  Diagnosis Date   Arthritis    knees   Food allergy    Shellfish allergy   Hypercholesterolemia    Hypertension    Stroke [facility])     Patient Active Problem List   Diagnosis Date Noted   Acute CVA (cerebrovascular accident) (HCC) 08/18/2018   Community acquired pneumonia 08/18/2018   Anemia 08/18/2018   HTN (hypertension) 08/18/2018   Hyperlipidemia 08/18/2018    Past Surgical History:  Procedure Laterality Date   APPENDECTOMY     CATARACT EXTRACTION N/A    I&D EXTREMITY Left 08/22/2013   Procedure: IRRIGATION AND DEBRIDEMENT EXTREMITY;  Surgeon: Tami Ribas, MD;  Location: [facility] OR;  Service: Orthopedics;  Laterality: Left;  left ring finger   LUMBAR DISC SURGERY  2011   in [place]     PERCUTANEOUS PINNING Left 08/22/2013   Procedure: PERCUTANEOUS PINNING EXTREMITY;  Surgeon: Tami Ribas, MD;  Location: [facility] OR;  Service: Orthopedics;  Laterality: Left;  left ring finger   TOOTH EXTRACTION          Home Medications    Prior to Admission medications   Medication Sig Start Date End Date Taking? Authorizing Provider  acetaminophen (TYLENOL) 500 MG tablet Take 500 mg by mouth every 6 (six) hours as needed for mild pain, fever or headache.    Yes [provider]  amoxicillin (AMOXIL) 500 MG capsule Take 1 capsule (500 mg total) by mouth 3 (three) times daily for 3 days. 08/22/18 08/25/18 Yes Zannie Cove, MD  apixaban (ELIQUIS) 5 MG TABS tablet Take 1 tablet (5 mg total) by mouth 2 (two) times daily. 08/22/18  Yes Zannie Cove, MD  doxepin (SINEQUAN) 25 MG capsule Take 1 capsule (25 mg total) by mouth at bedtime. 07/24/18  Yes Myles Lipps, MD  fluticasone (FLONASE) 50 MCG/ACT nasal spray Place 1 spray into both nostrils 2 (two) times daily. 07/24/18  Yes Myles Lipps, MD  guaiFENesin (MUCINEX) 600 MG 12 hr tablet Take 1,200 mg by mouth 2 (two) times daily.   Yes [provider]  metoprolol tartrate (LOPRESSOR) 25 MG tablet Take 0.5 tablets (12.5 mg total) by mouth 2 (two) times daily. 08/22/18  Yes Zannie Cove, MD  omeprazole (PRILOSEC) 40  MG capsule Take 1 capsule (40 mg total) by mouth daily. 07/30/18  Yes Armbruster, Willaim Rayas, MD  vitamin B-12 (CYANOCOBALAMIN) 500 MCG tablet Take 500 mcg by mouth daily.   Yes [provider]  atorvastatin (LIPITOR) 40 MG tablet Take 1 tablet (40 mg total) by mouth daily at 6 PM. Patient not taking: Reported on 08/23/2018 08/22/18   Zannie Cove, MD  docusate sodium (COLACE) 100 MG capsule Take 1 capsule (100 mg total) by mouth 2 (two) times daily for 7 days. 08/23/18 08/30/18  Shaune Pollack, MD  oxyCODONE (ROXICODONE) 5 MG immediate release tablet Take 1 tablet (5 mg total) by mouth every 4 (four) hours as needed  for moderate pain or severe pain. 08/23/18   Shaune Pollack, MD  predniSONE (DELTASONE) 20 MG tablet Take 2 tablets (40 mg total) by mouth daily with breakfast for 7 days. Then transition back to his previously prescribed 20 mg dose 08/23/18 08/30/18  Shaune Pollack, MD    Family History Family History  Problem Relation Age of Onset   Hypertension Mother    Hypertension Father     Social History Social History   Tobacco Use   Smoking status: Former Smoker    Types: Cigarettes    Last attempt to quit: 04/23/2005    Years since quitting: 13.3   Smokeless tobacco: Never Used  Substance Use Topics   Alcohol use: No    Alcohol/week: 0.0 standard drinks   Drug use: No     Allergies   Patient has no known allergies.   Review of Systems Review of Systems  Constitutional: Positive for fatigue. Negative for chills and fever.  HENT: Negative for congestion and rhinorrhea.   Eyes: Negative for visual disturbance.  Respiratory: Negative for cough, shortness of breath and wheezing.   Cardiovascular: Negative for chest pain and leg swelling.  Gastrointestinal: Negative for abdominal pain, diarrhea, nausea and vomiting.  Genitourinary: Negative for dysuria and flank pain.  Musculoskeletal: Positive for arthralgias, joint swelling and myalgias. Negative for neck pain and neck stiffness.  Skin: Negative for rash and wound.  Allergic/Immunologic: Negative for immunocompromised state.  Neurological: Positive for weakness and numbness. Negative for syncope and headaches.  All other systems reviewed and are negative.    Physical Exam Updated Vital Signs BP (!) 129/51    Pulse 61    Temp 97.6 F (36.4 C) (Oral)    Resp 15    Ht  (1.6 m)    Wt 49.9 kg    SpO2 94%    BMI 19.49 kg/m   Physical Exam Vitals signs and nursing note reviewed.  Constitutional:      General: He is not in acute distress.    Appearance: He is well-developed.     Comments: Cachectic, fatigued-appearing  but non-toxic  HENT:     Head: Normocephalic and atraumatic.  Eyes:     Conjunctiva/sclera: Conjunctivae normal.  Neck:     Musculoskeletal: Neck supple.  Cardiovascular:     Rate and Rhythm: Normal rate. Rhythm irregular.     Heart sounds: Normal heart sounds. No murmur. No friction rub.  Pulmonary:     Effort: Pulmonary effort is normal. No respiratory distress.     Breath sounds: Normal breath sounds. No wheezing or rales.  Abdominal:     General: There is no distension.     Palpations: Abdomen is soft.     Tenderness: There is no abdominal tenderness.  Musculoskeletal:     Comments: 1+ pitting  edema bilateral LE. Moderate TTP diffuse, worse on L>R ankle with mild edema. No joint warmth or erythema.  Skin:    General: Skin is warm.     Capillary Refill: Capillary refill takes less than 2 seconds.  Neurological:     Mental Status: He is alert and oriented to person, place, and time.     Motor: No abnormal muscle tone.     Comments: Alert, oriented x 3. No CN deficits. Strength 3+/5 LUE and LLE, which is slightly improved per report/notes. 5/5 throughout right. Intact sensation to light touch b/l UE and LE.      ED Treatments / Results  Labs (all labs ordered are listed, but only abnormal results are displayed) Labs Reviewed  CBC WITH DIFFERENTIAL/PLATELET - Abnormal; Notable for the following components:      Result Value   Hemoglobin 10.8 (*)    HCT 36.0 (*)    MCV 75.2 (*)    MCH 22.5 (*)    RDW 19.1 (*)    Platelets 540 (*)    Neutro Abs 9.3 (*)    All other components within normal limits  COMPREHENSIVE METABOLIC PANEL - Abnormal; Notable for the following components:   CO2 18 (*)    Glucose, Bld 113 (*)    Calcium 7.9 (*)    Total Protein 5.9 (*)    Albumin 1.9 (*)    AST 158 (*)    All other components within normal limits  LACTIC ACID, PLASMA - Abnormal; Notable for the following components:   Lactic Acid, Venous 2.1 (*)    All other components within  normal limits  CK - Abnormal; Notable for the following components:   Total CK 1,612 (*)    All other components within normal limits  ALT - Abnormal; Notable for the following components:   ALT 98 (*)    All other components within normal limits  MAGNESIUM  CK  LACTIC ACID, PLASMA  MAGNESIUM  BILIRUBIN, TOTAL    EKG None  Radiology Dg Ankle Complete Left  Result Date: 08/23/2018 CLINICAL DATA:  Left ankle pain EXAM: LEFT ANKLE COMPLETE - 3+ VIEW COMPARISON:  None. FINDINGS: Soft tissue swelling without acute fracture or dislocation. No opaque foreign body. IMPRESSION: Soft tissue swelling without fracture. Electronically Signed   By: Marnee Spring M.D.   On: 08/23/2018 10:44   Ct Abdomen Pelvis W Contrast  Result Date: 08/23/2018 CLINICAL DATA:  Severe lower back pain of uncertain etiology, recent stroke, history hypertension, former smoker EXAM: CT ABDOMEN AND PELVIS WITH CONTRAST TECHNIQUE: Multidetector CT imaging of the abdomen and pelvis was performed using the standard protocol following bolus administration of intravenous contrast. Sagittal and coronal MPR images reconstructed from axial data set. CONTRAST:  OMNIPAQUE IOHEXOL 300 MG/ML SOLN IV. No oral contrast. COMPARISON:  09/29/2014 FINDINGS: Lower chest: BILATERAL pleural effusions and compressive atelectasis of the lower lobes Hepatobiliary: Gallbladder well distended with question mild wall thickening. No calculi or biliary dilatation evident. No focal hepatic mass lesion. Pancreas: Normal appearance Spleen: Normal appearance Adrenals/Urinary Tract: Adrenal glands normal appearance. 6 mm nonobstructing calculus at upper pole LEFT kidney. Tiny LEFT renal cysts kidneys, ureters and bladder otherwise normal. Stomach/Bowel: Appendix surgically absent by history. Stomach decompressed. Retained dense contrast within LEFT colon to rectum. Bowel loops otherwise normal appearance. Vascular/Lymphatic: Atherosclerotic calcifications  aorta and iliac arteries without aneurysm. Minimal pericardial effusion. No adenopathy. Reproductive: Few prostatic calcifications without prostatic enlargement. Other: Scattered soft tissue edema diffusely may related to  hyperproteinemia, anasarca, fluid overload. No free air or free fluid. No hernia. Musculoskeletal: Degenerative disc and facet disease changes of the lumbar spine with multilevel spinal stenosis. IMPRESSION: Question mild gallbladder wall thickening; consider ultrasound assessment. Diffuse soft tissue edema. No additional acute intra-abdominal or intrapelvic abnormalities. Electronically Signed   By: Ulyses SouthwardMark  Boles M.D.   On: 08/23/2018 14:00   Ct L-spine No Charge  Result Date: 08/23/2018 CLINICAL DATA:  Back pain.  Severe left leg pain. EXAM: CT LUMBAR SPINE with CONTRAST TECHNIQUE: Coned in CT reformats of the lumbar spine were generated from contemporaneous abdomen and pelvis CT at the request of the physician to provide greater detail of the spine. COMPARISON:  09/29/2014 abdominal CT FINDINGS: Segmentation: 5 lumbar type vertebrae Alignment: Degree of spinal straightening. Vertebrae: No fracture, endplate erosion, or focal lesion. Paraspinal and other soft tissues: Reported separately. Disc levels: T12- L1: Spondylosis. Mild facet spurring and bilateral foraminal narrowing L1-L2: Moderate disc narrowing and endplate ridging. Mild facet spurring. Mild bilateral foraminal narrowing L2-L3: Disc narrowing with endplate ridging and partially calcified disc protrusion. Degenerative facet spurring. There is high-grade spinal stenosis. Moderate right foraminal narrowing. L3-L4: Degenerative disc narrowing and bulging with endplate ridging. Degenerative posterior element hypertrophy. At least moderate spinal stenosis. Moderate right foraminal narrowing L4-L5: Advanced disc narrowing and degenerative facet spurring. There is endplate ridging and biforaminal stenosis. Prior laminectomy with moderate  residual spinal stenosis L5-S1:Disc narrowing and endplate degeneration with ridging. Degenerative facet spurring. Mild-to-moderate bilateral foraminal narrowing. Patent spinal canal after left laminotomy. IMPRESSION: 1. No acute finding. 2. 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 Spinal canal narrowing is high-grade at L2-3. 3. Laminotomies at L4-5 and L5-S1. Electronically Signed   By: Marnee SpringJonathon  Watts M.D.   On: 08/23/2018 14:05    Procedures Procedures (including critical care time)  Medications Ordered in ED Medications  ondansetron (ZOFRAN) injection 4 mg (4 mg Intravenous Given 08/23/18 0953)  acetaminophen (TYLENOL) tablet 1,000 mg (1,000 mg Oral Given 08/23/18 0950)  morphine 4 MG/ML injection 5 mg (5 mg Intravenous Given 08/23/18 0956)  sodium chloride 0.9 % bolus 500 mL (0 mLs Intravenous Stopped 08/23/18 1146)  sodium chloride 0.9 % bolus 500 mL (500 mLs Intravenous New Bag/Given 08/23/18 1154)  iohexol (OMNIPAQUE) 300 MG/ML solution 100 mL (100 mLs Intravenous Contrast Given 08/23/18 1251)  oxyCODONE-acetaminophen (PERCOCET/ROXICET) 5-325 MG per tablet 2 tablet (2 tablets Oral Given 08/23/18 1424)  methylPREDNISolone sodium succinate (SOLU-MEDROL) 125 mg/2 mL injection 125 mg (125 mg Intravenous Given 08/23/18 1423)     Initial Impression / Assessment and Plan / ED Course  I have reviewed the triage vital signs and the nursing notes.  Pertinent labs & imaging results that were available during my care of the patient were reviewed by me and considered in my medical decision making (see chart for details).  Clinical Course as of Aug 23 1439  Sat Aug 23, 2018  1051 73 yo M here w/ what I suspect is acute flair of likely underlying inflammatory arthritis. Unclear primary dx but pt was just hospitalized, started on prednisone and referred to Rheum. From his recent CVA perspective, he has no new focal neuro deficits. He stopped NSAIDs 2/2 starting  blood thinner and I suspect this is contributing to his arthritis pain. Will start analgesics, check screening labs including CK. Will plan to likely tx with analgesics. D/w Daughter who helped communicate with pt, and is in agreement with this plan.   [CI]  1151 CK  remains elevated. LFTs also chronically elevated. While awaiting CT, will discuss with Hospitalist.    [CI]    Clinical Course User Index [CI] Shaune Pollack, MD       CT imaging reviewed as above. No acute intra-abd pathology. Query GB wall thickening - I suspect this is 2/2 his overall fluid status, but will get a dedicated U/S to eval for acute or chronic cholecystitis. He does have some LFT elevation but as above, this is chronic and more likely related to underlying autoimmune/inflammatory condition. Othewrise, he also has formainl stenosis bilaterally and this could certainly be contributing to his leg pain as well.  Current plan: - F/u U/S - if negative or if only shows edema but no secondary infectious signs, will d/c with prednisone increase to 40 mg and Oxycodone for his chronic leg pain - If positive, discuss with Surgery re: ABX versus surgical management, likely admit  Daughter is aware Hedy Jacob, [phone]).  Sign out to [place], [place], to f/u labs and dispo. Dr. Jeraldine Loots also aware @ sign out.  Final Clinical Impressions(s) / ED Diagnoses   Final diagnoses:  Back pain  RUQ pain  Arthralgia of both ankles  Elevated CK  Transaminitis    ED Discharge Orders         Ordered    oxyCODONE (ROXICODONE) 5 MG immediate release tablet  Every 4 hours PRN     08/23/18 1441    predniSONE (DELTASONE) 20 MG tablet  Daily with breakfast     08/23/18 1441    docusate sodium (COLACE) 100 MG capsule  2 times daily     08/23/18 1441           Shaune Pollack, MD 08/23/18 1441

## 2018-08-23 NOTE — Discharge Instructions (Addendum)
For your pain: - We are going to INCREASE your PREDNISONE from 20 mg (prescribed at discharge yesterday) to 40 mg for the next 7 days; AFTER THIS, go back to the prescribed 20 mg (1 tablet) dose until you see Rheumatology - Take the Oxycodone AS NEEDED - Do not take Ibuprofen on your new blood thinner  Regarding your gallbladder findings: you have gallstones, but this does not appear to be causing any issues acutely. Please follow up with the general surgeon in the next 1 week for recheck of this issue. Return to the ER for emergent changes or worsening symptoms.

## 2018-08-23 NOTE — ED Triage Notes (Signed)
Pt from home; pt admitted for stroke 4/26 discharged home yesterday; per pt's daughter, pt has been c/o severe L leg pain, burning in bottom of bilateral feet, swelling to L leg; residual L sided weakness from stroke; per ems,speaks a "slang form" of vietnamese, will need to call daughter to translate; denies cough, fever  [phone] pts daughter [name] Warriner  130/68 P 63 93% RA RR 16

## 2018-08-23 NOTE — ED Notes (Signed)
Patient is resting comfortably.  Respirations even and unlabored

## 2018-08-23 NOTE — ED Provider Notes (Addendum)
Care assumed from Dr. Erma Heritage at sign over of care, please see their notes for full documentation of patient's complaint/HPI. Briefly, pt here with b/l leg/ankle pain worse since last night, was recently discharged after CVA, now on xarelto so can't take ibuprofen which he used to take regularly for his pain. Results so far show lactic WNL, CBC with diff with chronic stable anemia and thrombocytosis, CMP with stable elevation in AST 158, lactic initially 2.1 down to 1.3, Mg WNL, CK 1612 but down from prior, ALT chronically elevated at 98, Tbili WNL, CT lumbar with advanced and diffuse degenerative disease with multilevel spinal and foraminal stenosis with spinal canal narrowing that's high grade at L2-3, CT abd/pelv with question mild gallbladder wall thickening and diffuse soft tissue edema without other acute intraabd/pelv abnormalities, L ankle xray with soft tissue swelling but otherwise no acute findings. Awaiting dedicated RUQ U/S to r/o chronic cholecystitis, pt without any abdominal pain. Plan is to f/up on those results, if negative then discharge with prescribed meds that Dr. Erma Heritage will rx.   Additional hx: when asked if pt has abdominal pain, he states yes, but reports it's been going on for "a long time". Nothing acutely worsened at this time.   Physical Exam  BP (!) 129/51   Pulse 61   Temp 97.6 F (36.4 C) (Oral)   Resp 15   Ht 5\' 3"  (1.6 m)   Wt 49.9 kg   SpO2 94%   BMI 19.49 kg/m   Physical Exam Gen: afebrile, VSS, NAD HEENT: EOMI Resp: no resp distress CV: rate WNL Abd: Soft, nondistended, +BS throughout, with very mild generalized abd discomfort but no focal area of TTP, no r/g/r, neg murphy's, neg mcburney's MsK: moving all extremities with ease  ED Course/Procedures   Clinical Course as of Aug 22 1433  Sat Aug 23, 2018  1051 73 yo M here w/ what I suspect is acute flair of likely underlying inflammatory arthritis. Unclear primary dx but pt was just hospitalized,  started on prednisone and referred to Rheum. From his recent CVA perspective, he has no new focal neuro deficits. He stopped NSAIDs 2/2 starting blood thinner and I suspect this is contributing to his arthritis pain. Will start analgesics, check screening labs including CK. Will plan to likely tx with analgesics. D/w Daughter who helped communicate with pt, and is in agreement with this plan.   [CI]  1151 CK remains elevated. LFTs also chronically elevated. While awaiting CT, will discuss with Hospitalist.    [CI]    Clinical Course User Index [CI] Shaune Pollack, MD    Results for orders placed or performed during the hospital encounter of 08/23/18  CBC with Differential  Result Value Ref Range   WBC 10.5 4.0 - 10.5 K/uL   RBC 4.79 4.22 - 5.81 MIL/uL   Hemoglobin 10.8 (L) 13.0 - 17.0 g/dL   HCT 47.0 (L) 96.2 - 83.6 %   MCV 75.2 (L) 80.0 - 100.0 fL   MCH 22.5 (L) 26.0 - 34.0 pg   MCHC 30.0 30.0 - 36.0 g/dL   RDW 62.9 (H) 47.6 - 54.6 %   Platelets 540 (H) 150 - 400 K/uL   nRBC 0.0 0.0 - 0.2 %   Neutrophils Relative % 88 %   Neutro Abs 9.3 (H) 1.7 - 7.7 K/uL   Lymphocytes Relative 8 %   Lymphs Abs 0.8 0.7 - 4.0 K/uL   Monocytes Relative 3 %   Monocytes Absolute 0.3 0.1 - 1.0  K/uL   Eosinophils Relative 1 %   Eosinophils Absolute 0.1 0.0 - 0.5 K/uL   Basophils Relative 0 %   Basophils Absolute 0.0 0.0 - 0.1 K/uL   Immature Granulocytes 0 %   Abs Immature Granulocytes 0.04 0.00 - 0.07 K/uL  Comprehensive metabolic panel  Result Value Ref Range   Sodium 135 135 - 145 mmol/L   Potassium 4.9 3.5 - 5.1 mmol/L   Chloride 105 98 - 111 mmol/L   CO2 18 (L) 22 - 32 mmol/L   Glucose, Bld 113 (H) 70 - 99 mg/dL   BUN 13 8 - 23 mg/dL   Creatinine, Ser 1.61 0.61 - 1.24 mg/dL   Calcium 7.9 (L) 8.9 - 10.3 mg/dL   Total Protein 5.9 (L) 6.5 - 8.1 g/dL   Albumin 1.9 (L) 3.5 - 5.0 g/dL   AST 096 (H) 15 - 41 U/L   ALT QUANTITY NOT SUFFICIENT, UNABLE TO PERFORM TEST 0 - 44 U/L   Alkaline  Phosphatase 72 38 - 126 U/L   Total Bilirubin QUANTITY NOT SUFFICIENT, UNABLE TO PERFORM TEST 0.3 - 1.2 mg/dL   GFR calc non Af Amer >60 >60 mL/min   GFR calc Af Amer >60 >60 mL/min   Anion gap 12 5 - 15  Magnesium  Result Value Ref Range   Magnesium QUANTITY NOT SUFFICIENT, UNABLE TO PERFORM TEST 1.7 - 2.4 mg/dL  CK  Result Value Ref Range   Total CK QUANTITY NOT SUFFICIENT, UNABLE TO PERFORM TEST 49 - 397 U/L  Lactic acid, plasma  Result Value Ref Range   Lactic Acid, Venous 2.1 (HH) 0.5 - 1.9 mmol/L  Lactic acid, plasma  Result Value Ref Range   Lactic Acid, Venous 1.3 0.5 - 1.9 mmol/L  Magnesium  Result Value Ref Range   Magnesium 1.9 1.7 - 2.4 mg/dL  CK  Result Value Ref Range   Total CK 1,612 (H) 49 - 397 U/L  ALT  Result Value Ref Range   ALT 98 (H) 0 - 44 U/L  Bilirubin, total  Result Value Ref Range   Total Bilirubin 0.5 0.3 - 1.2 mg/dL    Dg Ankle Complete Left  Result Date: 08/23/2018 CLINICAL DATA:  Left ankle pain EXAM: LEFT ANKLE COMPLETE - 3+ VIEW COMPARISON:  None. FINDINGS: Soft tissue swelling without acute fracture or dislocation. No opaque foreign body. IMPRESSION: Soft tissue swelling without fracture. Electronically Signed   By: Marnee Spring M.D.   On: 08/23/2018 10:44   Ct Abdomen Pelvis W Contrast  Result Date: 08/23/2018 CLINICAL DATA:  Severe lower back pain of uncertain etiology, recent stroke, history hypertension, former smoker EXAM: CT ABDOMEN AND PELVIS WITH CONTRAST TECHNIQUE: Multidetector CT imaging of the abdomen and pelvis was performed using the standard protocol following bolus administration of intravenous contrast. Sagittal and coronal MPR images reconstructed from axial data set. CONTRAST:  OMNIPAQUE IOHEXOL 300 MG/ML SOLN IV. No oral contrast. COMPARISON:  09/29/2014 FINDINGS: Lower chest: BILATERAL pleural effusions and compressive atelectasis of the lower lobes Hepatobiliary: Gallbladder well distended with question mild wall  thickening. No calculi or biliary dilatation evident. No focal hepatic mass lesion. Pancreas: Normal appearance Spleen: Normal appearance Adrenals/Urinary Tract: Adrenal glands normal appearance. 6 mm nonobstructing calculus at upper pole LEFT kidney. Tiny LEFT renal cysts kidneys, ureters and bladder otherwise normal. Stomach/Bowel: Appendix surgically absent by history. Stomach decompressed. Retained dense contrast within LEFT colon to rectum. Bowel loops otherwise normal appearance. Vascular/Lymphatic: Atherosclerotic calcifications aorta and iliac arteries without  aneurysm. Minimal pericardial effusion. No adenopathy. Reproductive: Few prostatic calcifications without prostatic enlargement. Other: Scattered soft tissue edema diffusely may related to hyperproteinemia, anasarca, fluid overload. No free air or free fluid. No hernia. Musculoskeletal: Degenerative disc and facet disease changes of the lumbar spine with multilevel spinal stenosis. IMPRESSION: Question mild gallbladder wall thickening; consider ultrasound assessment. Diffuse soft tissue edema. No additional acute intra-abdominal or intrapelvic abnormalities. Electronically Signed   By: Ulyses Southward M.D.   On: 08/23/2018 14:00   Ct L-spine No Charge  Result Date: 08/23/2018 CLINICAL DATA:  Back pain.  Severe left leg pain. EXAM: CT LUMBAR SPINE with CONTRAST TECHNIQUE: Coned in CT reformats of the lumbar spine were generated from contemporaneous abdomen and pelvis CT at the request of the physician to provide greater detail of the spine. COMPARISON:  09/29/2014 abdominal CT FINDINGS: Segmentation: 5 lumbar type vertebrae Alignment: Degree of spinal straightening. Vertebrae: No fracture, endplate erosion, or focal lesion. Paraspinal and other soft tissues: Reported separately. Disc levels: T12- L1: Spondylosis. Mild facet spurring and bilateral foraminal narrowing L1-L2: Moderate disc narrowing and endplate ridging. Mild facet spurring. Mild bilateral  foraminal narrowing L2-L3: Disc narrowing with endplate ridging and partially calcified disc protrusion. Degenerative facet spurring. There is high-grade spinal stenosis. Moderate right foraminal narrowing. L3-L4: Degenerative disc narrowing and bulging with endplate ridging. Degenerative posterior element hypertrophy. At least moderate spinal stenosis. Moderate right foraminal narrowing L4-L5: Advanced disc narrowing and degenerative facet spurring. There is endplate ridging and biforaminal stenosis. Prior laminectomy with moderate residual spinal stenosis L5-S1:Disc narrowing and endplate degeneration with ridging. Degenerative facet spurring. Mild-to-moderate bilateral foraminal narrowing. Patent spinal canal after left laminotomy. IMPRESSION: 1. No acute finding. 2. 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 Spinal canal narrowing is high-grade at L2-3. 3. Laminotomies at L4-5 and L5-S1. Electronically Signed   By: Marnee Spring M.D.   On: 08/23/2018 14:05   US Abdomen Limited Ruq  Result Date: 08/23/2018 CLINICAL DATA:  Pain x6 years EXAM: ULTRASOUND ABDOMEN LIMITED RIGHT UPPER QUADRANT COMPARISON:  CT from earlier the same day FINDINGS: Gallbladder: Single mobile gallstone 6 mm. Mild gallbladder wall thickening up to 5.3 mm. There is a small amount of pericholecystic fluid. Sonographer describes no sonographic Murphy's sign. Common bile duct: Not conclusively identified Liver: No focal lesion identified. Within normal limits in parenchymal echogenicity. Portal vein is patent on color Doppler imaging with normal direction of blood flow towards the liver. Trace perihepatic ascites. Incidental note made of moderate right pleural effusion. IMPRESSION: 1. Cholelithiasis with nonspecific gallbladder wall thickening. 2. Right pleural effusion and small amount of perihepatic/pericholecystic ascites. Electronically Signed   By: Corlis Leak M.D.   On: 08/23/2018 15:48      Meds ordered this encounter  Medications  . DISCONTD: morphine 4 MG/ML injection 4 mg  . ondansetron (ZOFRAN) injection 4 mg  . acetaminophen (TYLENOL) tablet 1,000 mg  . morphine 4 MG/ML injection 5 mg  . sodium chloride 0.9 % bolus 500 mL  . sodium chloride 0.9 % bolus 500 mL  . iohexol (OMNIPAQUE) 300 MG/ML solution 100 mL  . oxyCODONE-acetaminophen (PERCOCET/ROXICET) 5-325 MG per tablet 2 tablet  . methylPREDNISolone sodium succinate (SOLU-MEDROL) 125 mg/2 mL injection 125 mg     MDM:   ICD-10-CM   1. Arthralgia of both ankles M25.571    M25.572   2. Back pain M54.9 CT L-SPINE NO CHARGE    CT L-SPINE NO CHARGE  3. RUQ pain R10.11 US Abdomen  Limited RUQ    US Abdomen Limited RUQ  4. Elevated CK R74.8   5. Transaminitis R74.0   6. Calculus of gallbladder without cholecystitis without obstruction K80.20     4:14 PM RUQ U/S with cholelithiasis with nonspecific gallbladder wall thickening up to 5.673mm, small amount of pericholecystic fluid but also has perihepatic ascites, neg sonographic murphy's. On my exam, he has very minimal discomfort to his abdomen diffusely but no focal tenderness, neg murphy's and no focal RUQ tenderness; reports he's had some abd pain for a while but nothing new or different today. Spoke with his daughter who denies that he's been reporting any abdominal pain to her. This doesn't appear to be the primary concern for his visit, it's primarily his joint pain which he's had for a while and is supposed to f/up with rheumatology about. I doubt his RUQ U/S finding is indicative of any acute pathology, and I doubt he needs emergent surgical intervention of this. Again, this doesn't appear to be his complaint today, and his exam doesn't suggest acute cholecystitis, or even chronic cholecystitis. I discussed with pt and he states he feels some better after pain meds, and is agreeable to being discharged with instructions to increase prednisone and take pain meds  already rx'd by Dr. Erma HeritageIsaacs, and f/up with his provider this week. As for the gallbladder findings, advised that he f/up with gen surg in 1 wk for recheck and to discuss possible outpatient management of this finding. Discussed case with my current attending Dr. Jeraldine LootsLockwood who agrees with plan. Doubt need for further emergent work up or intervention at this time. I explained the diagnosis and have given explicit precautions to return to the ER including for any other new or worsening symptoms. The patient and his daughter understand and accept the medical plan as it's been dictated and I have answered their questions. Discharge instructions concerning home care and prescriptions have been given. The patient is STABLE and is discharged to home in good condition.      [address], [place], [place] 08/23/18 1625    Shaune PollackIsaacs, [place], MD 08/24/18 [phone]

## 2018-08-23 NOTE — ED Notes (Signed)
Pt returned from xray

## 2018-08-23 NOTE — Discharge Summary (Addendum)
Physician Discharge Summary  Nathan Bruce XLK:[id number] DOB: 11-04-1945 DOA: 08/17/2018  PCP: Rutherford Guys, MD  Admit date: 08/17/2018 Discharge date: 08/22/2018  Time spent: 45 minutes  Recommendations for Outpatient Follow-up:  1. 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 Rheumatology FU with Dr.Beekman to be rescheduled 3. Neurology Dr.Sethi in 1 month 4. 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 male admitted with progressive weakness and confusion.  Found to have CVA and LLPNA.  He also has left leg swelling.  Per chart review has had progressive weight loss of > 20 lbs in last 3 months.  Also developed severe "eczema" on BL LE in 2019-- biopsy done by dermatology in 2019-- ? Mite vs eczema on pathology  Hospital Course:   Acute CVA: -admitted with worsening weakness and L hemiplegia -MRI noted: Scattered acute infarctions affecting the left cerebellum and both cerebral hemispheres. -with residual L hemiplegia -Neurology consulted, this was felt to be an embolic CVA, tele noted  Afib, EP confirmed this, started Eliquis,  -echo EF 60-65% -carotid: <1-39% -HgbA1c: 4.9 -LDL: 84 -PT/OT- SNFrecommended,  But we planned for Home with HH due to baseline confusion and extreme language barrier and presence of assistance and family at home -neurology FU in 1 month  Weight loss/elevated AST/ALT/skin rash/muscle tenderness -had been seen by GI ([facility]) and has an  appointment with rheumatology (Dr. Amil Amen at [facility]), also s/p unremarkable workup by derm -work was ongoing as outpatient prior to admission -ANA negative -CCP +(weak positive at 22), ESR 60 -EGD/colonscopy was postponed due to intolerance of prep -given muscle pain and tenderness, elevated ESR, skin rash, we started started Prednisone 81m daily and FU with Rheumatology, missed his appt with Dr.Beekman, requested daughter to reschedule  New Afib -noted on tele this admission, converted to NSR -now started on Eliquis, HR was controlled without rate control agents  Anemia -appears Fe def per old labs -outpatient work up in progress   moderately severe protein calorie malnutrition:  -likley due to undiagnosed connective tissue d/o vs malignancy -Outpatient GI and Rheum workup ongoing   left lower lobe pneumonia: -Appeared to be community-acquired -Covid 19 PCR negative -treated with ceftriaxone and azithromycin, then amoxicillin at DC for 3days -consider FU CXR in 6 weeks  Hyponatremia -improved with IVF  Mild cognitive dysfunction suspected   Consultations:  Neurology Dr.Sethi  Discharge Exam: Vitals:   08/22/18 0759 08/22/18 1125  BP: (!) 118/56 121/61  Pulse: 63 (!) 57  Resp: 18 18  Temp: (!) 97.5 F (36.4 C) (!) 97.4 F (36.3 C)  SpO2: 99% 96%    General: Alert, awake, oriented to self and place Cardiovascular: S1S2/RRR Respiratory: Ronchi at L base  Discharge Instructions   Discharge Instructions    Ambulatory referral to Neurology   Complete by:  As directed    Follow up with stroke clinic NP (Jessica Vanschaick or CCecille Rubin if both not available, consider Dr. PMamie Nick  Sethi, Dr. Bess Harvest, or Dr. Sarina Ill) at [facility] in about 4 weeks.  Plan discharge to SNF when bed available   Diet - low sodium heart healthy   Complete by:  As directed    Increase activity slowly   Complete by:  As  directed      Allergies as of 08/22/2018   No Known Allergies     Medication List    STOP taking these medications   cetirizine 10 MG tablet Commonly known as:  ZYRTEC   furosemide 20 MG tablet Commonly known as:  LASIX     TAKE these medications   acetaminophen 500 MG tablet Commonly known as:  TYLENOL Take 500 mg by mouth every 6 (six) hours as needed for mild pain, fever or headache.   amoxicillin 500 MG capsule Commonly known as:  AMOXIL Take 1 capsule (500 mg total) by mouth 3 (three) times daily for 3 days.   apixaban 5 MG Tabs tablet Commonly known as:  ELIQUIS Take 1 tablet (5 mg total) by mouth 2 (two) times daily.   atorvastatin 40 MG tablet Commonly known as:  LIPITOR Take 1 tablet (40 mg total) by mouth daily at 6 PM.   doxepin 25 MG capsule Commonly known as:  SINEQUAN Take 1 capsule (25 mg total) by mouth at bedtime.   fluticasone 50 MCG/ACT nasal spray Commonly known as:  FLONASE Place 1 spray into both nostrils 2 (two) times daily.   guaiFENesin 600 MG 12 hr tablet Commonly known as:  MUCINEX Take 1,200 mg by mouth 2 (two) times daily.   metoprolol tartrate 25 MG tablet Commonly known as:  LOPRESSOR Take 0.5 tablets (12.5 mg total) by mouth 2 (two) times daily.   omeprazole 40 MG capsule Commonly known as:  PRILOSEC Take 1 capsule (40 mg total) by mouth daily.   vitamin B-12 500 MCG tablet Commonly known as:  CYANOCOBALAMIN Take 500 mcg by mouth daily.      No Known Allergies Follow-up Information    [facility] Follow up in 4 week(s).   Specialty:  Neurology Why:  stroke clinic. office will call with appointment date and time Contact information: [address] [place] [phone]       Hennie Duos, MD. Schedule an appointment as soon as possible for a visit in 1 week(s).   Specialty:  Rheumatology Contact information: [address]  [address] [phone]        Wendie Agreste, MD. Schedule an appointment as soon as possible for a visit in 1 week(s).   Specialties:  Family Medicine, Sports Medicine Contact information: [address] [place] [address] [phone]            The results of significant diagnostics from this hospitalization (including imaging, microbiology, ancillary and laboratory) are listed below for reference.    Significant Diagnostic Studies: Ct Angio Head W Or Wo Contrast  Result Date: 08/19/2018 CLINICAL DATA:  Stroke EXAM: CT ANGIOGRAPHY HEAD AND NECK TECHNIQUE: Multidetector CT imaging of the head and neck was performed using the standard protocol during bolus administration of intravenous contrast. Multiplanar CT image reconstructions and MIPs were obtained to evaluate the vascular anatomy. Carotid stenosis measurements (when applicable) are obtained utilizing NASCET criteria, using the distal internal carotid diameter as the denominator. CONTRAST:  161m OMNIPAQUE IOHEXOL 350 MG/ML SOLN COMPARISON:  MRI head 08/18/2018 FINDINGS: CT HEAD FINDINGS Brain: Hypodensity in  the right medial frontal parietal lobe compatible with acute ACA infarct. Chronic left ACA infarct. Small area of acute infarction left frontal parietal lobe as noted on MRI. Acute infarct right medial occipital lobe as noted on MRI. Small acute infarct left cerebellum best seen on MRI. Small acute infarct left cerebellum. No acute hemorrhage or mass. Generalized atrophy without midline shift Vascular: Atherosclerotic calcification. Negative for hyperdense vessel Skull: Negative Sinuses: Negative Orbits: Negative Review of the MIP images confirms the above findings CTA NECK FINDINGS Aortic arch: Mild atherosclerotic disease aortic arch without aneurysm or dissection. Proximal great vessels widely patent. Right carotid system: Mild atherosclerotic calcification right carotid bifurcation without significant stenosis Left carotid  system: Mild atherosclerotic calcification left carotid bifurcation without stenosis Vertebral arteries: Both vertebral arteries patent to the basilar. Mild stenosis origin of right vertebral artery. Skeleton: Cervical spondylosis.  No acute skeletal abnormality. Other neck: Subcentimeter left thyroid nodule. No mass or adenopathy in the neck. Upper chest: Small bilateral pleural effusions. No infiltrate or mass in the upper lobes. Review of the MIP images confirms the above findings CTA HEAD FINDINGS Anterior circulation: Mild atherosclerotic disease cavernous carotid bilaterally without stenosis. Severe stenosis right A2 segment corresponding to distal right ACA infarct. Left ACA patent. Middle cerebral artery patent bilaterally without stenosis or branch occlusion. Posterior circulation: Both vertebral arteries patent to the basilar. Mild stenosis distal vertebral bilaterally. PICA patent bilaterally. Basilar widely patent. Superior cerebellar artery patent bilaterally. Hypoplastic distal basilar due to fetal origin of the posterior cerebral artery bilaterally. Fetal origin left posterior cerebral artery which is small but patent. Fetal origin right posterior cerebral artery which is small and severely diseased. Venous sinuses: Patent Anatomic variants: None Delayed phase: Normal enhancement postcontrast infusion. Review of the MIP images confirms the above findings IMPRESSION: 1. Acute/subacute infarcts in the right ACA territory, right occipital lobe, left parietal lobe, and left cerebellum as noted on MRI yesterday. No acute hemorrhage. 2. Mild atherosclerotic disease in the carotid bifurcation bilaterally. Mild vertebral stenosis bilaterally. No significant carotid or vertebral artery stenosis in the neck. 3. Severe disease in the right A2 segment accounting for right ACA infarct. 4. Severe disease right posterior cerebral artery accounting for right occipital infarct. Electronically Signed   By: Franchot Gallo M.D.   On: 08/19/2018 15:09   Dg Ankle Complete Left  Result Date: 08/23/2018 CLINICAL DATA:  Left ankle pain EXAM: LEFT ANKLE COMPLETE - 3+ VIEW COMPARISON:  None. FINDINGS: Soft tissue swelling without acute fracture or dislocation. No opaque foreign body. IMPRESSION: Soft tissue swelling without fracture. Electronically Signed   By: Monte Fantasia M.D.   On: 08/23/2018 10:44   Ct Head Wo Contrast  Result Date: 08/17/2018 CLINICAL DATA:  Left-sided weakness with confusion EXAM: CT HEAD WITHOUT CONTRAST TECHNIQUE: Contiguous axial images were obtained from the base of the skull through the vertex without intravenous contrast. COMPARISON:  None. FINDINGS: Brain: No hemorrhage or intracranial mass. Encephalomalacia involving the left greater than right frontal lobe. Hypodensity within the right parasagittal frontal parietal vertex. Age indeterminate hypodensity within the left posterior parietal lobe. Mild atrophy. Small vessel ischemic changes of the white matter. Mildly prominent ventricles felt secondary to atrophy. Vascular: No hyperdense vessels.  Carotid vascular calcification Skull: No fracture Sinuses/Orbits: No acute finding. Other: None IMPRESSION: 1. 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 No hemorrhage. Age indeterminate cortical infarct within the left posterior parietal lobe. 2. Atrophy and small  vessel ischemic changes of the white matter. Encephalomalacia within the left greater than right frontal lobes. Electronically Signed   By: Donavan Foil M.D.   On: 08/17/2018 22:54   Ct Angio Neck W Or Wo Contrast  Result Date: 08/19/2018 CLINICAL DATA:  Stroke EXAM: CT ANGIOGRAPHY HEAD AND NECK TECHNIQUE: Multidetector CT imaging of the head and neck was performed using the standard protocol during bolus administration of intravenous contrast. Multiplanar CT image reconstructions and MIPs were  obtained to evaluate the vascular anatomy. Carotid stenosis measurements (when applicable) are obtained utilizing NASCET criteria, using the distal internal carotid diameter as the denominator. CONTRAST:  139m OMNIPAQUE IOHEXOL 350 MG/ML SOLN COMPARISON:  MRI head 08/18/2018 FINDINGS: CT HEAD FINDINGS Brain: Hypodensity in the right medial frontal parietal lobe compatible with acute ACA infarct. Chronic left ACA infarct. Small area of acute infarction left frontal parietal lobe as noted on MRI. Acute infarct right medial occipital lobe as noted on MRI. Small acute infarct left cerebellum best seen on MRI. Small acute infarct left cerebellum. No acute hemorrhage or mass. Generalized atrophy without midline shift Vascular: Atherosclerotic calcification. Negative for hyperdense vessel Skull: Negative Sinuses: Negative Orbits: Negative Review of the MIP images confirms the above findings CTA NECK FINDINGS Aortic arch: Mild atherosclerotic disease aortic arch without aneurysm or dissection. Proximal great vessels widely patent. Right carotid system: Mild atherosclerotic calcification right carotid bifurcation without significant stenosis Left carotid system: Mild atherosclerotic calcification left carotid bifurcation without stenosis Vertebral arteries: Both vertebral arteries patent to the basilar. Mild stenosis origin of right vertebral artery. Skeleton: Cervical spondylosis.  No acute skeletal abnormality. Other neck: Subcentimeter left thyroid nodule. No mass or adenopathy in the neck. Upper chest: Small bilateral pleural effusions. No infiltrate or mass in the upper lobes. Review of the MIP images confirms the above findings CTA HEAD FINDINGS Anterior circulation: Mild atherosclerotic disease cavernous carotid bilaterally without stenosis. Severe stenosis right A2 segment corresponding to distal right ACA infarct. Left ACA patent. Middle cerebral artery patent bilaterally without stenosis or branch occlusion.  Posterior circulation: Both vertebral arteries patent to the basilar. Mild stenosis distal vertebral bilaterally. PICA patent bilaterally. Basilar widely patent. Superior cerebellar artery patent bilaterally. Hypoplastic distal basilar due to fetal origin of the posterior cerebral artery bilaterally. Fetal origin left posterior cerebral artery which is small but patent. Fetal origin right posterior cerebral artery which is small and severely diseased. Venous sinuses: Patent Anatomic variants: None Delayed phase: Normal enhancement postcontrast infusion. Review of the MIP images confirms the above findings IMPRESSION: 1. Acute/subacute infarcts in the right ACA territory, right occipital lobe, left parietal lobe, and left cerebellum as noted on MRI yesterday. No acute hemorrhage. 2. Mild atherosclerotic disease in the carotid bifurcation bilaterally. Mild vertebral stenosis bilaterally. No significant carotid or vertebral artery stenosis in the neck. 3. Severe disease in the right A2 segment accounting for right ACA infarct. 4. Severe disease right posterior cerebral artery accounting for right occipital infarct. Electronically Signed   By: CFranchot GalloM.D.   On: 08/19/2018 15:09   Mr Brain Wo Contrast  Result Date: 08/18/2018 CLINICAL DATA:  Left-sided weakness and altered mental status. EXAM: MRI HEAD WITHOUT CONTRAST TECHNIQUE: Multiplanar, multiecho pulse sequences of the brain and surrounding structures were obtained without intravenous contrast. COMPARISON:  Head CT 08/17/2018 FINDINGS: Brain: Multiple regions acute/subacute infarction are present consistent with either embolic disease or vasculitis. There are a cluster of small vessel infarctions in the left cerebellum. Right cerebral hemisphere shows extensive region of  infarctions in the right anterior cerebral artery territory and in the right posterior cerebral artery territory. Relative sparing of the right middle cerebral artery territory with  only a small acute infarction in the right caudate head. In the left hemisphere, there is acute infarction in the left posterior parietal lobe consistent with an MCA branch vessel infarction. There is infarction at the left frontoparietal vertex that is probably MCA territory but could to some degree be ACA territory. Areas of infarction show mild swelling. There are very minor blood products in the right medial parietal infarction. No evidence of tumor patient does have old infarction in the left ACA territory affecting the cingulate gyrus region. Old lacunar infarctions in the basal ganglia and in the right thalamus. No hydrocephalus. No extra-axial collection. Vascular: Major vessels at the base of the brain show flow. Skull and upper cervical spine: Negative Sinuses/Orbits: Clear/normal Other: None IMPRESSION: Scattered acute infarctions affecting the left cerebellum and both cerebral hemispheres. The pattern is most consistent with embolic disease from the heart or ascending aorta. Differential diagnosis does include vasculitis. Old infarction in the left cingulate gyrus. Infarctions are most prominent in the right anterior cerebral artery territory, the right posterior cerebral artery territory in the left middle cerebral artery territory. Areas of acute infarction show mild swelling but there is no mass effect or shift. Small amount of petechial blood products in the right medial parietal region. Electronically Signed   By: Nelson Chimes M.D.   On: 08/18/2018 16:41   Ct Abdomen Pelvis W Contrast  Result Date: 08/23/2018 CLINICAL DATA:  Severe lower back pain of uncertain etiology, recent stroke, history hypertension, former smoker EXAM: CT ABDOMEN AND PELVIS WITH CONTRAST TECHNIQUE: Multidetector CT imaging of the abdomen and pelvis was performed using the standard protocol following bolus administration of intravenous contrast. Sagittal and coronal MPR images reconstructed from axial data set. CONTRAST:   17m OMNIPAQUE IOHEXOL 300 MG/ML SOLN IV. No oral contrast. COMPARISON:  09/29/2014 FINDINGS: Lower chest: BILATERAL pleural effusions and compressive atelectasis of the lower lobes Hepatobiliary: Gallbladder well distended with question mild wall thickening. No calculi or biliary dilatation evident. No focal hepatic mass lesion. Pancreas: Normal appearance Spleen: Normal appearance Adrenals/Urinary Tract: Adrenal glands normal appearance. 6 mm nonobstructing calculus at upper pole LEFT kidney. Tiny LEFT renal cysts kidneys, ureters and bladder otherwise normal. Stomach/Bowel: Appendix surgically absent by history. Stomach decompressed. Retained dense contrast within LEFT colon to rectum. Bowel loops otherwise normal appearance. Vascular/Lymphatic: Atherosclerotic calcifications aorta and iliac arteries without aneurysm. Minimal pericardial effusion. No adenopathy. Reproductive: Few prostatic calcifications without prostatic enlargement. Other: Scattered soft tissue edema diffusely may related to hyperproteinemia, anasarca, fluid overload. No free air or free fluid. No hernia. Musculoskeletal: Degenerative disc and facet disease changes of the lumbar spine with multilevel spinal stenosis. IMPRESSION: Question mild gallbladder wall thickening; consider ultrasound assessment. Diffuse soft tissue edema. No additional acute intra-abdominal or intrapelvic abnormalities. Electronically Signed   By: MLavonia DanaM.D.   On: 08/23/2018 14:00   Ct L-spine No Charge  Result Date: 08/23/2018 CLINICAL DATA:  Back pain.  Severe left leg pain. EXAM: CT LUMBAR SPINE with CONTRAST TECHNIQUE: Coned in CT reformats of the lumbar spine were generated from contemporaneous abdomen and pelvis CT at the request of the physician to provide greater detail of the spine. COMPARISON:  09/29/2014 abdominal CT FINDINGS: Segmentation: 5 lumbar type vertebrae Alignment: Degree of spinal straightening. Vertebrae: No fracture, endplate erosion, or  focal lesion. Paraspinal and other soft tissues:  Reported separately. Disc levels: T12- L1: Spondylosis. Mild facet spurring and bilateral foraminal narrowing L1-L2: Moderate disc narrowing and endplate ridging. Mild facet spurring. Mild bilateral foraminal narrowing L2-L3: Disc narrowing with endplate ridging and partially calcified disc protrusion. Degenerative facet spurring. There is high-grade spinal stenosis. Moderate right foraminal narrowing. L3-L4: Degenerative disc narrowing and bulging with endplate ridging. Degenerative posterior element hypertrophy. At least moderate spinal stenosis. Moderate right foraminal narrowing L4-L5: Advanced disc narrowing and degenerative facet spurring. There is endplate ridging and biforaminal stenosis. Prior laminectomy with moderate residual spinal stenosis L5-S1:Disc narrowing and endplate degeneration with ridging. Degenerative facet spurring. Mild-to-moderate bilateral foraminal narrowing. Patent spinal canal after left laminotomy. IMPRESSION: 1. No acute finding. 2. 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 Spinal canal narrowing is high-grade at L2-3. 3. Laminotomies at L4-5 and L5-S1. Electronically Signed   By: Monte Fantasia M.D.   On: 08/23/2018 14:05   Dg Chest Port 1 View  Result Date: 08/17/2018 CLINICAL DATA:  Weakness EXAM: PORTABLE CHEST 1 VIEW COMPARISON:  06/17/2018 FINDINGS: Consolidation in the left lower lobe. No large effusion. Borderline cardiomegaly with aortic atherosclerosis. No pneumothorax. IMPRESSION: Left lower lobe consolidation concerning for a pneumonia. Radiographic follow-up to resolution is advised. Electronically Signed   By: Donavan Foil M.D.   On: 08/17/2018 23:02   Dg Swallowing Func-speech Pathology  Result Date: 08/20/2018 . Objective Swallowing Evaluation: Type of Study: MBS-Modified Barium Swallow Study  Patient Details Name: JADARRIUS MASELLI MRN: [id number] Date of Birth:  1945-06-25 Today's Date: 08/20/2018 Time: SLP Start Time (ACUTE ONLY): 1410 -SLP Stop Time (ACUTE ONLY): 1430 SLP Time Calculation (min) (ACUTE ONLY): 20 min Past Medical History: Past Medical History: Diagnosis Date . Arthritis   knees . Food allergy   Shellfish allergy . Hypercholesterolemia  . Hypertension  Past Surgical History: Past Surgical History: Procedure Laterality Date . APPENDECTOMY   . CATARACT EXTRACTION N/A  . I&D EXTREMITY Left 08/22/2013  Procedure: IRRIGATION AND DEBRIDEMENT EXTREMITY;  Surgeon: Tennis Must, MD;  Location: [facility];  Service: Orthopedics;  Laterality: Left;  left ring finger . [place] SURGERY  2011  in [place]  . PERCUTANEOUS PINNING Left 08/22/2013  Procedure: PERCUTANEOUS PINNING EXTREMITY;  Surgeon: Tennis Must, MD;  Location: [facility];  Service: Orthopedics;  Laterality: Left;  left ring finger . TOOTH EXTRACTION   HPI: Patient is a 73 y/o male presenting to the ED on 08/17/2018 with primary complaints of L sided weakness and AMS. PMH significant of hypertension, hyperlipidemia, osteoarthritis. 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 Patient was screened for COVID-19 and was negative.  Chest x-ray also showed left lower lobe consolidation concerning for pneumonia.  Subjective: sitting in chair in radiology suite Assessment / Plan / Recommendation CHL IP CLINICAL IMPRESSIONS 08/20/2018 Clinical Impression Patient presents with a primary structural dysphagia from cervical osterophytes that are pushing into pharynx and resulting in restricted transit of bolus through UES. Patient also with mild-mod oropharyngeal dysphagia with inabilty to orally transit barium tablet, swallow initiation delays to vallecular sinus. Instance of penetration (that fully cleared laryngeal vestibule) occured after the swallow and likely a result of buildup of pyriform and vallecular sinus residuals. Patient able to solid   residuals with sips of plain water (not barium) but likely continued with both vallecular and pyriform sinus residuals. Patient unable to perform chin tuck despite demonstration.  SLP Visit Diagnosis Dysphagia, oropharyngeal phase (R13.12) Attention  and concentration deficit following -- Frontal lobe and executive function deficit following -- Impact on safety and function Moderate aspiration risk;Mild aspiration risk   CHL IP TREATMENT RECOMMENDATION 08/20/2018 Treatment Recommendations Therapy as outlined in treatment plan below   Prognosis 08/20/2018 Prognosis for Safe Diet Advancement Fair Barriers to Reach Goals -- Barriers/Prognosis Comment -- CHL IP DIET RECOMMENDATION 08/20/2018 SLP Diet Recommendations Dysphagia 3 (Mech soft) solids;Thin liquid Liquid Administration via Cup;Straw Medication Administration Crushed with puree Compensations Minimize environmental distractions;Slow rate;Small sips/bites;Follow solids with liquid Postural Changes Seated upright at 90 degrees;Remain semi-upright after after feeds/meals (Comment)   CHL IP OTHER RECOMMENDATIONS 08/20/2018 Recommended Consults -- Oral Care Recommendations Oral care BID Other Recommendations --   CHL IP FOLLOW UP RECOMMENDATIONS 08/20/2018 Follow up Recommendations None   CHL IP FREQUENCY AND DURATION 08/20/2018 Speech Therapy Frequency (ACUTE ONLY) min 2x/week Treatment Duration 1 week      CHL IP ORAL PHASE 08/20/2018 Oral Phase Impaired Oral - Pudding Teaspoon -- Oral - Pudding Cup -- Oral - Honey Teaspoon -- Oral - Honey Cup -- Oral - Nectar Teaspoon -- Oral - Nectar Cup Delayed oral transit Oral - Nectar Straw -- Oral - Thin Teaspoon -- Oral - Thin Cup WFL Oral - Thin Straw WFL Oral - Puree Lingual pumping;Weak lingual manipulation;Delayed oral transit Oral - Mech Soft Weak lingual manipulation;Lingual pumping;Delayed oral transit Oral - Regular -- Oral - Multi-Consistency -- Oral - Pill Weak lingual manipulation;Reduced posterior propulsion;Other  (Comment) Oral Phase - Comment --  CHL IP PHARYNGEAL PHASE 08/20/2018 Pharyngeal Phase Impaired Pharyngeal- Pudding Teaspoon -- Pharyngeal -- Pharyngeal- Pudding Cup -- Pharyngeal -- Pharyngeal- Honey Teaspoon -- Pharyngeal -- Pharyngeal- Honey Cup -- Pharyngeal -- Pharyngeal- Nectar Teaspoon -- Pharyngeal -- Pharyngeal- Nectar Cup -- Pharyngeal -- Pharyngeal- Nectar Straw -- Pharyngeal -- Pharyngeal- Thin Teaspoon -- Pharyngeal -- Pharyngeal- Thin Cup Delayed swallow initiation-vallecula;Pharyngeal residue - valleculae;Pharyngeal residue - pyriform;Pharyngeal residue - posterior pharnyx Pharyngeal -- Pharyngeal- Thin Straw Pharyngeal residue - valleculae;Pharyngeal residue - pyriform;Pharyngeal residue - posterior pharnyx;Penetration/Aspiration during swallow Pharyngeal Material enters airway, remains ABOVE vocal cords then ejected out Pharyngeal- Puree Pharyngeal residue - valleculae;Pharyngeal residue - pyriform;Pharyngeal residue - posterior pharnyx Pharyngeal -- Pharyngeal- Mechanical Soft Pharyngeal residue - valleculae;Pharyngeal residue - pyriform;Pharyngeal residue - posterior pharnyx;Reduced tongue base retraction Pharyngeal -- Pharyngeal- Regular -- Pharyngeal -- Pharyngeal- Multi-consistency -- Pharyngeal -- Pharyngeal- Pill -- Pharyngeal -- Pharyngeal Comment --  CHL IP CERVICAL ESOPHAGEAL PHASE 08/20/2018 Cervical Esophageal Phase Impaired Pudding Teaspoon -- Pudding Cup -- Honey Teaspoon -- Honey Cup -- Nectar Teaspoon -- Nectar Cup Reduced cricopharyngeal relaxation Nectar Straw -- Thin Teaspoon -- Thin Cup Reduced cricopharyngeal relaxation Thin Straw Reduced cricopharyngeal relaxation Puree Reduced cricopharyngeal relaxation Mechanical Soft Reduced cricopharyngeal relaxation Regular -- Multi-consistency -- Pill -- Cervical Esophageal Comment Patient with prominent cervical osteophytes that are pushing into pharynx and likely resulting in restricted bolus transit through UES Dannial Monarch  08/20/2018, 6:07 PM    Sonia Baller, MA, CCC-SLP Speech Therapy [facility] Acute Rehab Pager: [phone]          Vas US Carotid (at [place] Only)  Result Date: 08/19/2018 Carotid Arterial Duplex Study Indications: CVA. Performing Technologist: Toma Copier RVS  Examination Guidelines: A complete evaluation includes B-mode imaging, spectral Doppler, color Doppler, and power Doppler as needed of all accessible portions of each vessel. Bilateral testing is considered an integral part of a complete examination. Limited examinations for reoccurring indications may be performed as noted.  Right Carotid Findings: +----------+--------+--------+--------+---------------------+------------------+  PSV cm/sEDV cm/sStenosisDescribe             Comments           +----------+--------+--------+--------+---------------------+------------------+ CCA Prox  121     16                                   mild intimal                                                              changes            +----------+--------+--------+--------+---------------------+------------------+ CCA Distal100     10                                   mild intimal                                                              changes            +----------+--------+--------+--------+---------------------+------------------+ ICA Prox  97      10      1-39%   diffuse and          mild plaque                                          heterogenous                            +----------+--------+--------+--------+---------------------+------------------+ ICA Mid   103     22                                                      +----------+--------+--------+--------+---------------------+------------------+ ICA Distal113     26                                                      +----------+--------+--------+--------+---------------------+------------------+ ECA       91      7                heterogenous         mild plaque        +----------+--------+--------+--------+---------------------+------------------+ +----------+--------+-------+--------+-------------------+           PSV cm/sEDV cmsDescribeArm Pressure (mmHG) +----------+--------+-------+--------+-------------------+ [id number]                                        +----------+--------+-------+--------+-------------------+ +---------+--------+---+--------+--+ VertebralPSV cm/s150EDV cm/s14 +---------+--------+---+--------+--+  Left Carotid Findings: +----------+--------+--------+--------+---------------------+------------------+           PSV cm/sEDV cm/sStenosisDescribe             Comments           +----------+--------+--------+--------+---------------------+------------------+ CCA Prox  133     17                                   mild intimal                                                              changes            +----------+--------+--------+--------+---------------------+------------------+ CCA Distal97      7                                    mild intimal                                                              changes            +----------+--------+--------+--------+---------------------+------------------+ ICA Prox  118     18      1-39%   heterogenous and     mild plaque                                          diffuse                                 +----------+--------+--------+--------+---------------------+------------------+ ICA Mid   94      24                                                      +----------+--------+--------+--------+---------------------+------------------+ ICA Distal104     26                                                      +----------+--------+--------+--------+---------------------+------------------+ ECA       95      4               heterogenous         mild plaque         +----------+--------+--------+--------+---------------------+------------------+ +----------+--------+--------+--------+-------------------+ SubclavianPSV cm/sEDV cm/sDescribeArm Pressure (mmHG) +----------+--------+--------+--------+-------------------+           181                                         +----------+--------+--------+--------+-------------------+ +---------+--------+---+--------+--+  [place] cm/s113EDV cm/s10 +---------+--------+---+--------+--+  Summary: Right Carotid: Velocities in the right ICA are consistent with a 1-39% stenosis. Left Carotid: Velocities in the left ICA are consistent with a 1-39% stenosis. Vertebrals:  Bilateral vertebral arteries demonstrate antegrade flow. Subclavians: Normal flow hemodynamics were seen in bilateral subclavian              arteries. *See table(s) above for measurements and observations.  Electronically signed by Antony Contras MD on 08/19/2018 at 12:52:46 PM.    Final    Vas [place] Lower Extremity Venous (dvt)  Result Date: 08/19/2018  Lower Venous Study Indications: Swelling.  Performing Technologist: Oliver Hum RVT  Examination Guidelines: A complete evaluation includes B-mode imaging, spectral Doppler, color Doppler, and power Doppler as needed of all accessible portions of each vessel. Bilateral testing is considered an integral part of a complete examination. Limited examinations for reoccurring indications may be performed as noted.  +-----+---------------+---------+-----------+----------+-------+ RIGHTCompressibilityPhasicitySpontaneityPropertiesSummary +-----+---------------+---------+-----------+----------+-------+ CFV  Full           Yes      Yes                          +-----+---------------+---------+-----------+----------+-------+   +---------+---------------+---------+-----------+----------+-------+ LEFT     CompressibilityPhasicitySpontaneityPropertiesSummary  +---------+---------------+---------+-----------+----------+-------+ CFV      Full           Yes      Yes                          +---------+---------------+---------+-----------+----------+-------+ SFJ      Full                                                 +---------+---------------+---------+-----------+----------+-------+ FV Prox  Full                                                 +---------+---------------+---------+-----------+----------+-------+ FV Mid   Full                                                 +---------+---------------+---------+-----------+----------+-------+ FV DistalFull                                                 +---------+---------------+---------+-----------+----------+-------+ PFV      Full                                                 +---------+---------------+---------+-----------+----------+-------+ POP      Full           Yes      Yes                          +---------+---------------+---------+-----------+----------+-------+ PTV      Full                                                 +---------+---------------+---------+-----------+----------+-------+  PERO     Full                                                 +---------+---------------+---------+-----------+----------+-------+     Summary: Right: No evidence of common femoral vein obstruction. Left: There is no evidence of deep vein thrombosis in the lower extremity. No cystic structure found in the popliteal fossa.  *See table(s) above for measurements and observations. Electronically signed by Harold Barban MD on 08/19/2018 at 5:12:58 PM.    Final     Microbiology: Recent Results (from the past 240 hour(s))  SARS Coronavirus 2 [facility] order, Performed in [facility] hospital lab)     Status: None   Collection Time: 08/18/18 12:01 AM  Result Value Ref Range Status   SARS Coronavirus 2 NEGATIVE NEGATIVE Final    Comment: (NOTE) If result is  NEGATIVE SARS-CoV-2 target nucleic acids are NOT DETECTED. The SARS-CoV-2 RNA is generally detectable in upper and lower  respiratory specimens during the acute phase of infection. The lowest  concentration of SARS-CoV-2 viral copies this assay can detect is 250  copies / mL. A negative result does not preclude SARS-CoV-2 infection  and should not be used as the sole basis for treatment or other  patient management decisions.  A negative result may occur with  improper specimen collection / handling, submission of specimen other  than nasopharyngeal swab, presence of viral mutation(s) within the  areas targeted by this assay, and inadequate number of viral copies  (<250 copies / mL). A negative result must be combined with clinical  observations, patient history, and epidemiological information. If result is POSITIVE SARS-CoV-2 target nucleic acids are DETECTED. The SARS-CoV-2 RNA is generally detectable in upper and lower  respiratory specimens dur ing the acute phase of infection.  Positive  results are indicative of active infection with SARS-CoV-2.  Clinical  correlation with patient history and other diagnostic information is  necessary to determine patient infection status.  Positive results do  not rule out bacterial infection or co-infection with other viruses. If result is PRESUMPTIVE POSTIVE SARS-CoV-2 nucleic acids MAY BE PRESENT.   A presumptive positive result was obtained on the submitted specimen  and confirmed on repeat testing.  While 2019 novel coronavirus  (SARS-CoV-2) nucleic acids may be present in the submitted sample  additional confirmatory testing may be necessary for epidemiological  and / or clinical management purposes  to differentiate between  SARS-CoV-2 and other Sarbecovirus currently known to infect humans.  If clinically indicated additional testing with an alternate test  methodology [id number]) is advised. The SARS-CoV-2 RNA is generally  detectable  in upper and lower respiratory sp ecimens during the acute  phase of infection. The expected result is Negative. Fact Sheet for Patients:  StrictlyIdeas.no Fact Sheet for Healthcare Providers: BankingDealers.co.za This test is not yet approved or cleared by the [place] FDA and has been authorized for detection and/or diagnosis of SARS-CoV-2 by FDA under an Emergency Use Authorization (EUA).  This EUA will remain in effect (meaning this test can be used) for the duration of the COVID-19 declaration under Section 564(b)(1) of the Act, 21 U.S.C. section 360bbb-3(b)(1), unless the authorization is terminated or revoked sooner. Performed at [facility], [place] [address] [place],  [address]      Labs: Basic Metabolic Panel: Recent Labs  Lab 08/17/18 2234 08/18/18 0453 08/18/18 1055 08/19/18 0532 08/20/18 0445 08/23/18 1000  NA 128*  --  134* 134* 134* 135  K 3.9  --  3.8 4.5 4.5 4.9  CL 99  --  101 103 103 105  CO2 24  --  23 21* 20* 18*  GLUCOSE 201*  --  113* 94 99 113*  BUN 11  --  '8 10 13 13  ' CREATININE 0.70 0.69 0.63 0.72 0.72 0.64  CALCIUM 7.9*  --  7.7* 7.6* 7.7* 7.9*  MG  --   --   --   --   --  1.9  QUANTITY NOT SUFFICIENT, UNABLE TO PERFORM TEST   Liver Function Tests: Recent Labs  Lab 08/17/18 2234 08/20/18 0445 08/23/18 1000  AST 132* 143* 158*  ALT 88* 72* 98*  QUANTITY NOT SUFFICIENT, UNABLE TO PERFORM TEST  ALKPHOS 47 56 72  BILITOT 0.6 1.0 0.5  QUANTITY NOT SUFFICIENT, UNABLE TO PERFORM TEST  PROT 5.9* 5.5* 5.9*  ALBUMIN 2.0* 1.8* 1.9*   No results for input(s): LIPASE, AMYLASE in the last 168 hours. No results for input(s): AMMONIA in the last 168 hours. CBC: Recent Labs  Lab 08/17/18 2234 08/18/18 0453 08/18/18 1055 08/19/18 0532 08/20/18 0940 08/23/18 1000  WBC 9.8 10.0  --  8.3 8.9 10.5  NEUTROABS  --   --   --   --   --  9.3*  HGB 9.0* 7.8* 8.2* 8.7* 9.9* 10.8*  HCT  27.9* 23.8* 25.5* 26.7* 31.6* 36.0*  MCV 72.3* 69.8*  --  70.8* 71.5* 75.2*  PLT 430* 475*  --  418* 411* 540*   Cardiac Enzymes: Recent Labs  Lab 08/23/18 1000  CKTOTAL 1,612*  QUANTITY NOT SUFFICIENT, UNABLE TO PERFORM TEST   BNP: BNP (last 3 results) No results for input(s): BNP in the last 8760 hours.  ProBNP (last 3 results) No results for input(s): PROBNP in the last 8760 hours.  CBG: No results for input(s): GLUCAP in the last 168 hours.     Signed:  Domenic Polite MD.  [facility] 08/23/2018, 3:43 PM

## 2018-08-25 ENCOUNTER — Telehealth: Payer: Self-pay | Admitting: Family Medicine

## 2018-08-25 ENCOUNTER — Ambulatory Visit: Payer: Medicare Other | Admitting: Family Medicine

## 2018-08-25 NOTE — Telephone Encounter (Signed)
Pt is needing orders fron pcp  For relieving mattress for hospital bed the one that has air flowing.sometimes call alternating pressure pad , pt also needs sliding board , wheelchair with removable cushion .beside commode with arms. Pt is wanting dr to call physical therapist for clarification at [phone] her name is Kyla Balzarine with [facility] .

## 2018-08-25 NOTE — Telephone Encounter (Signed)
Copied from CRM [id number]. Topic: Quick Communication - Home Health Verbal Orders >> Aug 25, 2018 12:34 PM Elliot Gault wrote: Caller/Agency: Stark Klein / PT with [facility]  Callback Number: [phone] (LVM)  Requesting PT verbal orders  Frequency: 2x 4 include transfer training / Hospital discharged May 1st

## 2018-08-26 ENCOUNTER — Ambulatory Visit (INDEPENDENT_AMBULATORY_CARE_PROVIDER_SITE_OTHER): Payer: Medicare Other | Admitting: Neurology

## 2018-08-26 ENCOUNTER — Telehealth: Payer: Self-pay | Admitting: Family Medicine

## 2018-08-26 ENCOUNTER — Other Ambulatory Visit: Payer: Self-pay

## 2018-08-26 ENCOUNTER — Encounter: Payer: Self-pay | Admitting: Neurology

## 2018-08-26 DIAGNOSIS — J189 Pneumonia, unspecified organism: Secondary | ICD-10-CM

## 2018-08-26 DIAGNOSIS — I639 Cerebral infarction, unspecified: Secondary | ICD-10-CM

## 2018-08-26 DIAGNOSIS — R269 Unspecified abnormalities of gait and mobility: Secondary | ICD-10-CM

## 2018-08-26 MED ORDER — APIXABAN 5 MG PO TABS: 5.0000 mg | ORAL_TABLET | Freq: Two times a day (BID) | ORAL | 4 refills | Status: AC

## 2018-08-26 NOTE — Telephone Encounter (Signed)
Copied from CRM [id number]. Topic: General - Other >> Aug 26, 2018  1:01 PM Marylen Ponto wrote: Reason for CRM: Nathan Bruce with [facility] stated pt takes oxyCODONE and she would like to know if Dr. Leretha Pol will approve for pt to take either Colace or Miralax due to him being constipated for 3 days. Cb# [phone]

## 2018-08-26 NOTE — Patient Outreach (Signed)
[facility]) Care Management  08/26/2018  Nathan Bruce 01/23/1946 [id number]    EMMI-STROKE-Not on APL RED ON EMMI ALERT Day # 1 Date: 08/25/2018 Red Alert Reason: " Questions/problems with meds? Yes"   Outreach attempt # 1 to patient.  Spoke with patient's daughter-[name](DPR on file). She voices that patient is confused and it worsens at night. She reports that she has been up all night with patient. She is inquiring about what she can do to help patient sleep better at night. RN CM gave some suggestions and also encouraged caregiver to speak with MD regarding sleep issues and possible meds to help. Reviewed and addressed red alert with daughter. She is wanting to know which pills can be safely crushed together and administered at the same time. She is aware that she needs to contact pharmacy and speak with pharmacist regarding this and plans to do so once pharmacy opens. She voices that she will call and make MD follow up appts. Patient is getting HH services and HHPT to come visit today. She denies any further RN CM needs or concerns at this time.    Plan: RN CM will close case as no further interventions needed at this time.   Antionette Fairy, RN,BSN,CCM [facility] Care Management Telephonic Care Management Coordinator Direct Phone: [phone] Toll Free: [phone] Fax: [phone]

## 2018-08-26 NOTE — Progress Notes (Signed)
PATIENT: Nathan Bruce DOB: 07-Oct-1945  Virtual Visit via video  I connected with Nathan Bruce on 08/26/18 at  by video and verified that I am speaking with the correct person using two identifiers.   I discussed the limitations, risks, security and privacy concerns of performing an evaluation and management service by video and the availability of in person appointments. I also discussed with the patient that there may be a patient responsible charge related to this service. The patient expressed understanding and agreed to proceed.  HISTORICAL  Nathan Bruce is a 73 year old male, seen in request by his primary care physician Dr. Pamella Pert, [place] for evaluation of stroke, her daughter Aline August was at bedside with him, he also has language detail, the history is from reviewing his hospital discharge, and from his daughter  I have reviewed and summarized the referring note from the referring physician.  He had a past medical history of hypertension, hyperlipidemia, developed severe eczema in November 2019, decided to receive treatment at [place], per daughter, the treatment did completely clear his skin condition, but unfortunately, he suffered reported stroke in January 2020, presented with generalized weakness, require 1 month of rehabilitation, eventually he regained ability to ambulate without assistant, came back to [place] at the end of February 2020, then his skin condition eczema came back,  He also began to have gradual worsening weakness, ambulatory difficulty, presented to hospital on August 18, 2018, for worsening left-sided weakness for 3 days, increased confusion, myalgia, arthralgia in the past few weeks, progressive decreased appetite, weight loss,  He had extensive evaluation at hospital, I personally reviewed MRI of the brain, scattered acute infarction affecting cerebellum, both hemisphere, with residual left hemiplegia, telemetry confirmed atrial fibrillation, started Eliquis  treatment, echocardiogram showed ejection fraction 60 to 65%, ultrasound of bilateral internal carotid artery was less than 39%, A1c was 4.9, LDL was 84   He was also found to have abnormal liver function with test, muscle tenderness, with elevated ESR, CCP, skin rash, he was treated with prednisone 20 mg daily, scheduled to follow-up with rheumatologist Dr. Amil Amen this week,  Laboratory evaluation also showed evidence of anemia, with severe protein calorie malnutrition, outpatient GI work-up is pending, he also was diagnosed with community-acquired pneumonia, is finishing up his amoxicillin home treatment,  Patient has significant agitation during his hospital stay, daughter also reported that he continue to experience significant confusion, some agitations, he can move his left arm better, but lying in bed, could not ambulate,   Laboratory evaluation on Aug 23, 2018, hemoglobin of 10.8, CMP showed decreased total protein of 5.9, elevated AST of 158, albumin was 1.9,   Observations/Objective: I have reviewed problem lists, medications, allergies.  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 The patient was provided an opportunity to ask questions and all were answered. The patient agreed with the plan and demonstrated an understanding of the instructions.   The patient was advised to call back or seek an in-person evaluation if the symptoms worsen or  if the condition fails to improve as anticipated.  I  provided 45 minutes of non-face-to-face time during this encounter.  REVIEW OF SYSTEMS: Full 14 system review of systems performed and notable only for as above All other review of systems were negative.  ALLERGIES: No Known Allergies  HOME MEDICATIONS: Current Outpatient Medications  Medication Sig Dispense Refill  . acetaminophen (TYLENOL) 500 MG tablet Take 500 mg by mouth every 6 (six) hours as needed for mild pain, fever or headache.     Marland Kitchen apixaban (ELIQUIS) 5 MG TABS tablet Take 1 tablet (5 mg total) by mouth 2 (two) times daily. 60 tablet 0  . atorvastatin (LIPITOR) 40 MG tablet Take 1 tablet (40 mg total) by mouth daily at 6 PM. (Patient not taking: Reported on 08/23/2018) 30 tablet 0  . docusate sodium (COLACE) 100 MG capsule Take 1 capsule (100 mg total) by mouth 2 (two) times daily for 7 days. 14 capsule 0  . doxepin (SINEQUAN) 25 MG capsule Take 1 capsule (25 mg total) by mouth at bedtime. 30 capsule 2  . fluticasone (FLONASE) 50 MCG/ACT nasal spray Place 1 spray into both nostrils 2 (two) times daily. 16 g 6  . guaiFENesin (MUCINEX) 600 MG 12 hr tablet Take 1,200 mg by mouth 2 (two) times daily.    . metoprolol tartrate (LOPRESSOR) 25 MG tablet Take 0.5 tablets (12.5 mg total) by mouth 2 (two) times daily. 60 tablet 0  . omeprazole (PRILOSEC) 40 MG capsule Take 1 capsule (40 mg total) by mouth daily. 90 capsule 1  . oxyCODONE (ROXICODONE) 5 MG immediate release tablet Take 1 tablet (5 mg total) by mouth every 4 (four) hours as needed for moderate pain or severe pain. 15 tablet 0  . predniSONE (DELTASONE) 20 MG tablet Take 2 tablets (40 mg total) by mouth daily with breakfast for 7 days. Then transition back to his previously prescribed 20 mg dose 14 tablet 0  . vitamin B-12 (CYANOCOBALAMIN) 500 MCG tablet Take 500 mcg by mouth daily.     No current facility-administered medications for this visit.     PAST MEDICAL HISTORY: Past Medical History:  Diagnosis Date  . Arthritis     knees  . Food allergy    Shellfish allergy  . Hypercholesterolemia   . Hypertension   . Stroke [facility])     PAST SURGICAL HISTORY: Past Surgical History:  Procedure Laterality Date  . APPENDECTOMY    . CATARACT EXTRACTION N/A   . I&D EXTREMITY Left 08/22/2013   Procedure: IRRIGATION AND DEBRIDEMENT EXTREMITY;  Surgeon: Tennis Must, MD;  Location: [place];  Service: Orthopedics;  Laterality: Left;  left ring finger  . [place] SURGERY  2011   in [place]   . PERCUTANEOUS PINNING Left 08/22/2013   Procedure: PERCUTANEOUS PINNING EXTREMITY;  Surgeon: Tennis Must, MD;  Location: [place];  Service: Orthopedics;  Laterality: Left;  left ring finger  . TOOTH EXTRACTION      FAMILY HISTORY: Family History  Problem Relation Age of Onset  . Hypertension Mother   . Hypertension Father     SOCIAL HISTORY:   Social History   Socioeconomic History  . Marital status: Married    Spouse name: Not on file  . Number of children: Not on file  . Years of education: Not on file  . Highest education level: Not on file  Occupational History  . Not on file  Social Needs  . Financial resource strain: Not on file  .  Food insecurity:    Worry: Not on file    Inability: Not on file  . Transportation needs:    Medical: Not on file    Non-medical: Not on file  Tobacco Use  . Smoking status: Former Smoker    Types: Cigarettes    Last attempt to quit: 04/23/2005    Years since quitting: 13.3  . Smokeless tobacco: Never Used  Substance and Sexual Activity  . Alcohol use: No    Alcohol/week: 0.0 standard drinks  . Drug use: No  . Sexual activity: Not on file  Lifestyle  . Physical activity:    Days per week: Not on file    Minutes per session: Not on file  . Stress: Not on file  Relationships  . Social connections:    Talks on phone: Not on file    Gets together: Not on file    Attends religious service: Not on file    Active member of club or organization: Not on file    Attends  meetings of clubs or organizations: Not on file    Relationship status: Not on file  . Intimate partner violence:    Fear of current or ex partner: Not on file    Emotionally abused: Not on file    Physically abused: Not on file    Forced sexual activity: Not on file  Other Topics Concern  . Not on file  Social History Narrative  . Not on file    Marcial Pacas, M.D. Ph.D.  [facility] [address], [place], [place] [address] Ph: [phone] Fax: [phone]  CC: Rutherford Guys, MD

## 2018-08-27 ENCOUNTER — Emergency Department (HOSPITAL_COMMUNITY): Payer: Medicare Other

## 2018-08-27 ENCOUNTER — Telehealth: Payer: Self-pay | Admitting: Family Medicine

## 2018-08-27 ENCOUNTER — Emergency Department (HOSPITAL_COMMUNITY)
Admission: EM | Admit: 2018-08-27 | Discharge: 2018-08-27 | Disposition: A | Discharge status: Home / Self Care | Payer: Medicare Other | Attending: Emergency Medicine | Admitting: Emergency Medicine

## 2018-08-27 ENCOUNTER — Encounter: Payer: Self-pay | Admitting: Gastroenterology

## 2018-08-27 ENCOUNTER — Encounter (HOSPITAL_COMMUNITY): Payer: Self-pay | Admitting: Radiology

## 2018-08-27 DIAGNOSIS — Z79899 Other long term (current) drug therapy: Secondary | ICD-10-CM | POA: Insufficient documentation

## 2018-08-27 DIAGNOSIS — M79604 Pain in right leg: Secondary | ICD-10-CM | POA: Insufficient documentation

## 2018-08-27 DIAGNOSIS — R51 Headache: Secondary | ICD-10-CM | POA: Insufficient documentation

## 2018-08-27 DIAGNOSIS — Z7901 Long term (current) use of anticoagulants: Secondary | ICD-10-CM | POA: Insufficient documentation

## 2018-08-27 DIAGNOSIS — R21 Rash and other nonspecific skin eruption: Secondary | ICD-10-CM | POA: Diagnosis not present

## 2018-08-27 DIAGNOSIS — Z87891 Personal history of nicotine dependence: Secondary | ICD-10-CM | POA: Insufficient documentation

## 2018-08-27 DIAGNOSIS — R519 Headache, unspecified: Secondary | ICD-10-CM

## 2018-08-27 DIAGNOSIS — R451 Restlessness and agitation: Secondary | ICD-10-CM | POA: Diagnosis not present

## 2018-08-27 DIAGNOSIS — I10 Essential (primary) hypertension: Secondary | ICD-10-CM | POA: Insufficient documentation

## 2018-08-27 DIAGNOSIS — M79605 Pain in left leg: Secondary | ICD-10-CM | POA: Diagnosis not present

## 2018-08-27 LAB — URINALYSIS, ROUTINE W REFLEX MICROSCOPIC
Bilirubin Urine: NEGATIVE
Glucose, UA: NEGATIVE mg/dL
Hgb urine dipstick: NEGATIVE
Ketones, ur: NEGATIVE mg/dL
Leukocytes,Ua: NEGATIVE
Nitrite: NEGATIVE
Protein, ur: NEGATIVE mg/dL
Specific Gravity, Urine: 1.017 (ref 1.005–1.030)
pH: 5 (ref 5.0–8.0)

## 2018-08-27 MED ORDER — OXYCODONE-ACETAMINOPHEN 5-325 MG PO TABS
1.0000 | ORAL_TABLET | Freq: Once | ORAL | Status: AC
  Administered 2018-08-27: 1 via ORAL
  Filled 2018-08-27: qty 1

## 2018-08-27 MED ORDER — MORPHINE SULFATE (PF) 4 MG/ML IV SOLN
4.0000 mg | Freq: Once | INTRAVENOUS | Status: AC
  Administered 2018-08-27: 04:00:00 4 mg via INTRAVENOUS
  Filled 2018-08-27: qty 1

## 2018-08-27 NOTE — ED Notes (Signed)
Pt complaint of pain, yells of pain, RN and provider Notified.

## 2018-08-27 NOTE — ED Notes (Addendum)
ptar arrived to transport patient home, all appropriate transport/discharge paperwork provided to ptar staff

## 2018-08-27 NOTE — Telephone Encounter (Signed)
Copied from CRM [id number]. Topic: General - Other >> Aug 27, 2018  1:00 PM Tamela Oddi wrote: Reason for CRM: Patient's daughter called to inform the doctor that the patient is having problems moving his bowel.  It is non productive and he is straining.  Daughter would like to know if there is something that the doctor could prescribe.  CB# [phone]

## 2018-08-27 NOTE — ED Provider Notes (Signed)
[facility] EMERGENCY DEPARTMENT Provider Note   CSN: [id number] Arrival date & time: 08/27/18  7829    History   Chief Complaint Chief Complaint  Patient presents with  . Headache    HPI Nathan Bruce is a 73 y.o. male.     HPI  This is a 73 year old male with a history of arthritis, recent CVA, hypertension, hyperlipidemia who presents with headache.  History is taken with the assistance of his daughter.  Apparently he speaks a dialect of [place] for which we do not have a Nurse, learning disability.  Daughter reports that her father has been complaining of a headache since 10 PM last night.  She was concerned as this could be a sign of another stroke.  He denies any new weakness or numbness.  She has not noted any speech difficulties.  He continues to complain of arthralgias and leg pain.  Daughter reports that this is been ongoing and he is due to see a rheumatologist.  She does report that he has been more confused and agitated specifically at night since discharge from the hospital.  Level 5 caveat.  Past Medical History:  Diagnosis Date  . Arthritis    knees  . Food allergy    Shellfish allergy  . Hypercholesterolemia   . Hypertension   . Stroke [facility])     Patient Active Problem List   Diagnosis Date Noted  . Gait abnormality 08/26/2018  . Acute CVA (cerebrovascular accident) (HCC) 08/18/2018  . Community acquired pneumonia 08/18/2018  . Anemia 08/18/2018  . HTN (hypertension) 08/18/2018  . Hyperlipidemia 08/18/2018    Past Surgical History:  Procedure Laterality Date  . APPENDECTOMY    . CATARACT EXTRACTION N/A   . I&D EXTREMITY Left 08/22/2013   Procedure: IRRIGATION AND DEBRIDEMENT EXTREMITY;  Surgeon: Tami Ribas, MD;  Location: [facility] OR;  Service: Orthopedics;  Laterality: Left;  left ring finger  . LUMBAR DISC SURGERY  2011   in [place]   . PERCUTANEOUS PINNING Left 08/22/2013   Procedure: PERCUTANEOUS PINNING EXTREMITY;  Surgeon: Tami Ribas, MD;   Location: [facility] OR;  Service: Orthopedics;  Laterality: Left;  left ring finger  . TOOTH EXTRACTION          Home Medications    Prior to Admission medications   Medication Sig Start Date End Date Taking? Authorizing Provider  acetaminophen (TYLENOL) 500 MG tablet Take 500 mg by mouth every 6 (six) hours as needed for mild pain, fever or headache.     [provider]  apixaban (ELIQUIS) 5 MG TABS tablet Take 1 tablet (5 mg total) by mouth 2 (two) times daily. 08/26/18   Levert Feinstein, MD  atorvastatin (LIPITOR) 40 MG tablet Take 1 tablet (40 mg total) by mouth daily at 6 PM. Patient not taking: Reported on 08/23/2018 08/22/18   Zannie Cove, MD  docusate sodium (COLACE) 100 MG capsule Take 1 capsule (100 mg total) by mouth 2 (two) times daily for 7 days. 08/23/18 08/30/18  Shaune Pollack, MD  doxepin (SINEQUAN) 25 MG capsule Take 1 capsule (25 mg total) by mouth at bedtime. 07/24/18   Myles Lipps, MD  fluticasone (FLONASE) 50 MCG/ACT nasal spray Place 1 spray into both nostrils 2 (two) times daily. 07/24/18   Myles Lipps, MD  guaiFENesin (MUCINEX) 600 MG 12 hr tablet Take 1,200 mg by mouth 2 (two) times daily.    [provider]  metoprolol tartrate (LOPRESSOR) 25 MG tablet Take 0.5 tablets (12.5  mg total) by mouth 2 (two) times daily. 08/22/18   Zannie CoveJoseph, Preetha, MD  omeprazole (PRILOSEC) 40 MG capsule Take 1 capsule (40 mg total) by mouth daily. 07/30/18   Armbruster, Willaim RayasSteven P, MD  oxyCODONE (ROXICODONE) 5 MG immediate release tablet Take 1 tablet (5 mg total) by mouth every 4 (four) hours as needed for moderate pain or severe pain. 08/23/18   Shaune PollackIsaacs, Cameron, MD  predniSONE (DELTASONE) 20 MG tablet Take 2 tablets (40 mg total) by mouth daily with breakfast for 7 days. Then transition back to his previously prescribed 20 mg dose 08/23/18 08/30/18  Shaune PollackIsaacs, Cameron, MD  vitamin B-12 (CYANOCOBALAMIN) 500 MCG tablet Take 500 mcg by mouth daily.    [provider]    Family History  Family History  Problem Relation Age of Onset  . Hypertension Mother   . Hypertension Father     Social History Social History   Tobacco Use  . Smoking status: Former Smoker    Types: Cigarettes    Last attempt to quit: 04/23/2005    Years since quitting: 13.3  . Smokeless tobacco: Never Used  Substance Use Topics  . Alcohol use: No    Alcohol/week: 0.0 standard drinks  . Drug use: No     Allergies   Patient has no known allergies.   Review of Systems Review of Systems  Constitutional: Negative for fever.  Respiratory: Negative for shortness of breath.   Cardiovascular: Negative for chest pain.  Gastrointestinal: Negative for abdominal pain.  Genitourinary: Negative for dysuria.  Musculoskeletal: Positive for arthralgias. Negative for gait problem.  Skin: Positive for rash.  Neurological: Positive for headaches. Negative for dizziness and weakness.  All other systems reviewed and are negative.    Physical Exam Updated Vital Signs BP (!) 118/54 (BP Location: Right Arm)   Pulse 79   Temp 97.9 F (36.6 C) (Oral)   Resp 17   Ht 1.6 m (5\' 3" )   Wt 50 kg   SpO2 96%   BMI 19.53 kg/m   Physical Exam Vitals signs and nursing note reviewed.  Constitutional:      Appearance: He is well-developed.     Comments: Occasionally yells out in pain  HENT:     Head: Normocephalic and atraumatic.  Eyes:     Pupils: Pupils are equal, round, and reactive to light.     Comments: Pupils 5 mm reactive bilaterally  Neck:     Musculoskeletal: Normal range of motion and neck supple.  Cardiovascular:     Rate and Rhythm: Normal rate and regular rhythm.     Heart sounds: Normal heart sounds. No murmur.  Pulmonary:     Effort: Pulmonary effort is normal. No respiratory distress.     Breath sounds: Normal breath sounds. No wheezing.  Abdominal:     General: Bowel sounds are normal.     Palpations: Abdomen is soft.     Tenderness: There is no abdominal tenderness. There is no  rebound.  Skin:    General: Skin is warm and dry.     Comments: Erythematous rash noted mostly over the anterior chest and bilateral upper legs  Neurological:     Mental Status: He is alert.     Comments: Symmetric facial features, decreased movement left  Psychiatric:        Behavior: Behavior is agitated.      ED Treatments / Results  Labs (all labs ordered are listed, but only abnormal results are displayed) Labs Reviewed  URINALYSIS,  ROUTINE W REFLEX MICROSCOPIC    EKG None  Radiology Ct Head Wo Contrast  Result Date: 08/27/2018 CLINICAL DATA:  Chronic headache, neuro deficit EXAM: CT HEAD WITHOUT CONTRAST TECHNIQUE: Contiguous axial images were obtained from the base of the skull through the vertex without intravenous contrast. COMPARISON:  08/19/18 FINDINGS: Brain: Multifocal subacute infarct in the bilateral ACA and PCA distribution, underestimated relative to brain MRI 08/18/2018. No evidence of infarct progression or hemorrhagic conversion. No hydrocephalus or masslike finding Vascular: Atherosclerotic calcification Skull: Negative Sinuses/Orbits: Gaze to the right.  Left cataract resection IMPRESSION: 1. No acute finding when compared to recent priors. 2. Multifocal subacute infarction best demonstrated by MRI 08/18/2018. Electronically Signed   By: Marnee Spring M.D.   On: 08/27/2018 04:12    Procedures Procedures (including critical care time)  Medications Ordered in ED Medications  morphine 4 MG/ML injection 4 mg (4 mg Intravenous Given 08/27/18 0349)  oxyCODONE-acetaminophen (PERCOCET/ROXICET) 5-325 MG per tablet 1 tablet (1 tablet Oral Given 08/27/18 0546)     Initial Impression / Assessment and Plan / ED Course  I have reviewed the triage vital signs and the nursing notes.  Pertinent labs & imaging results that were available during my care of the patient were reviewed by me and considered in my medical decision making (see chart for details).  Clinical Course  as of Aug 26 598  Wed Aug 27, 2018  2111 Patient intermittently yelling in pain.  Complaining of leg pain at this time.  Noted to have erythematous rash consistent with prior described rashes in his chart.   [CH]  [phone] Spoke with daughter.  Work-up essentially negative.  Daughter confirms headache has resolved.  Nursing discussed with her his yelling.  She states that that has his baseline and she thinks that he may be confused or sundowning.   [CH]    Clinical Course User Index [CH] Parys Elenbaas, Mayer Masker, MD       Presents with reported headache.  Recent CVA.  Daughter was concerned that this may represent a new stroke.  He appears to be at baseline from a neurologic perspective.  Limited history because of language barrier.  He does intermittently yell but the daughter states that this has been his baseline specifically at night.  Given reported increased confusion, will obtain a urinalysis to rule out infection.  CT head obtained and does not show any acute bleed or new abnormality.  On recheck, he is only endorsing leg pain which he has endorsed previously.  He appears to be at his baseline otherwise without any acute process.  After history, exam, and medical workup I feel the patient has been appropriately medically screened and is safe for discharge home. Pertinent diagnoses were discussed with the patient. Patient was given return precautions.   Final Clinical Impressions(s) / ED Diagnoses   Final diagnoses:  Bad headache  Pain in both lower extremities    ED Discharge Orders    None       Shon Baton, MD 08/27/18 [phone]

## 2018-08-27 NOTE — ED Triage Notes (Signed)
Pt arrived via EMS for H/A & body aches.

## 2018-08-27 NOTE — ED Notes (Signed)
ptar called for pt 

## 2018-08-28 ENCOUNTER — Telehealth: Payer: Self-pay | Admitting: Family Medicine

## 2018-08-28 NOTE — Telephone Encounter (Signed)
Orders have been received Patient is scheduled for tomorrow Will discuss further then but all orders seem appropriate given recent embolic CVA with residual deficits.

## 2018-08-28 NOTE — Telephone Encounter (Signed)
Please give verbal order for both colace and miralax to be used as needed for constipation. Thanks

## 2018-08-28 NOTE — Telephone Encounter (Signed)
Copied from CRM [id number]. Topic: Quick Communication - Home Health Verbal Orders >> Aug 28, 2018  3:58 PM Aretta Nip wrote: Caller/Agency: *[facility] Jeraldine Loots Betha Loa Number:[phone] Requesting /PT/ Therapy: for Dysphagia 2 times wk for 3 wks Frequency: 3 wks / 2 times wk

## 2018-08-28 NOTE — Telephone Encounter (Signed)
Verbal orders given by mei.

## 2018-08-28 NOTE — Telephone Encounter (Signed)
Please see note below. 

## 2018-08-28 NOTE — Telephone Encounter (Signed)
Called and LVM to confirm verbal orders.

## 2018-08-29 ENCOUNTER — Telehealth (INDEPENDENT_AMBULATORY_CARE_PROVIDER_SITE_OTHER): Payer: Medicare Other | Admitting: Family Medicine

## 2018-08-29 ENCOUNTER — Other Ambulatory Visit: Payer: Self-pay

## 2018-08-29 DIAGNOSIS — R945 Abnormal results of liver function studies: Secondary | ICD-10-CM

## 2018-08-29 DIAGNOSIS — L308 Other specified dermatitis: Secondary | ICD-10-CM

## 2018-08-29 DIAGNOSIS — Z8701 Personal history of pneumonia (recurrent): Secondary | ICD-10-CM

## 2018-08-29 DIAGNOSIS — R443 Hallucinations, unspecified: Secondary | ICD-10-CM

## 2018-08-29 DIAGNOSIS — I69398 Other sequelae of cerebral infarction: Secondary | ICD-10-CM

## 2018-08-29 DIAGNOSIS — R768 Other specified abnormal immunological findings in serum: Secondary | ICD-10-CM

## 2018-08-29 DIAGNOSIS — M2550 Pain in unspecified joint: Secondary | ICD-10-CM

## 2018-08-29 DIAGNOSIS — I480 Paroxysmal atrial fibrillation: Secondary | ICD-10-CM

## 2018-08-29 MED ORDER — OXYCODONE HCL 5 MG PO TABS: 5.0000 mg | ORAL_TABLET | Freq: Three times a day (TID) | ORAL | 0 refills | Status: AC | PRN

## 2018-08-29 MED ORDER — PREDNISONE 20 MG PO TABS: ORAL_TABLET | ORAL | 0 refills | Status: AC

## 2018-08-29 NOTE — Progress Notes (Signed)
Hospital follow up from stroke. 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 This did not happen before the stroke. Having "in and out" moments in his memory. He is most definitely eating better, he is starting to ask for more food. He has been using the bathroom with the help of the colace. He is still not moving his left leg but there is slow progress with movement with other parts of the body with  not as painful. He was given amoxicillin in the hospital because he has pneumonia, wants to know if he should have another regime of this med

## 2018-08-29 NOTE — Progress Notes (Signed)
Virtual Visit Note  I connected with patient and his daughter on 08/29/18 at 139pm by phone and verified that I am speaking with the correct person using two identifiers. Nathan Bruce is currently located at home and patient is currently with them during visit. The provider, Rutherford Guys, MD is located in their office at time of visit.  I discussed the limitations, risks, security and privacy concerns of performing an evaluation and management service by telephone and the availability of in person appointments. I also discussed with the patient that there may be a patient responsible charge related to this service. The patient expressed understanding and agreed to proceed.   CC: hosp followup  HPI ? 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   Community acquired pneumonia, LLQ   Anemia   HTN (hypertension)   Hyperlipidemia   Cognitive dysfunction  Neuro Dr Krista Blue - had televisit 3 days ago, no changes to medication, follouwp in 3 months Rheum Dr Amil Amen - had televisit 2 days ago, labs ordered, did not mention anything about pred  Started on daily prednisone 58m for presumed connective tissue disorder  Seen in ER 08/24/2018 for worsening b leg pain CT lumbar spine - 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 Mod Right pleural effusion and small amount of perihepatic/pericholecystic ascites.  - advised gen surg followup  Seen in  ER 08/27/2018 for bad headache and concern for repeat CVA, workup neg.   Daughter serves as [employer] main historian  Daughter reports that since CVA he has been very confused, screaming/shouting, having hallucinations about [place], very restless at night, still taking doxepin 215mwhich was started to help with itchiness  BMs are much better since started colace and miralax, having very scant red blood with straining Taking oxycodone every 6 hours and prednisone 4068mHas appt with derm in June for eczema Still scratching aggressively, worse in the thighs Has been moisturizing more with aveeno and vaseline Eczema today a bit better  Denies any chest pain, sob, cough, fevers His appetite is much better  Daughter reports that he is able to move LEFT hand minimally, he has a weak grip He can move elbow and shoulder He can't move left leg at all  Spends most of the day in bed as he is really exhausted and agitated They do try to sit him on the wheelchair but gets uncomfortable, waiting for wheelchair cushion Currently a minimum of 2 person transfer assist  Home health involved  PT recommends the following: Pressure relieving mattress Wheelchair with cushion Sliding board Bed side commode  No Known Allergies  Prior to Admission medications   Medication Sig Start Date End Date Taking? Authorizing Provider  acetaminophen (TYLENOL) 500 MG tablet Take 500 mg by mouth every 6 (six) hours as needed for mild pain, fever or headache.     [provider]  apixaban (ELIQUIS) 5 MG TABS tablet Take 1 tablet (5 mg total) by mouth 2 (two) times  daily. 08/26/18   Marcial Pacas, MD  atorvastatin (LIPITOR) 40 MG tablet Take 1 tablet (40 mg total) by mouth daily at 6 PM. Patient not taking: Reported on 08/23/2018 08/22/18   Domenic Polite, MD  docusate sodium (COLACE) 100 MG capsule Take 1 capsule (100 mg total) by mouth 2 (two) times daily for 7 days. 08/23/18 08/30/18  Duffy Bruce, MD  doxepin (SINEQUAN) 25 MG capsule Take 1 capsule (25 mg total) by mouth at bedtime. 07/24/18   Rutherford Guys, MD  fluticasone (FLONASE) 50 MCG/ACT nasal spray Place 1 spray into both nostrils 2 (two) times daily. 07/24/18   Rutherford Guys, MD  guaiFENesin (MUCINEX) 600 MG 12 hr tablet Take 1,200 mg by mouth 2 (two) times daily.    [provider]  metoprolol tartrate (LOPRESSOR) 25 MG tablet Take 0.5 tablets (12.5 mg total) by mouth 2 (two) times daily. 08/22/18   Domenic Polite, MD  omeprazole (PRILOSEC) 40 MG capsule Take 1 capsule (40 mg total) by mouth daily. 07/30/18   Armbruster, Carlota Raspberry, MD  oxyCODONE (ROXICODONE) 5 MG immediate release tablet Take 1 tablet (5 mg total) by mouth every 4 (four) hours as needed for moderate pain or severe pain. 08/23/18   Duffy Bruce, MD  predniSONE (DELTASONE) 20 MG tablet Take 2 tablets (40 mg total) by mouth daily with breakfast for 7 days. Then transition back to his previously prescribed 20 mg dose 08/23/18 08/30/18  Duffy Bruce, MD  vitamin B-12 (CYANOCOBALAMIN) 500 MCG tablet Take 500 mcg by mouth daily.    [provider]    Past Medical History:  Diagnosis Date  . Arthritis    knees  . Food allergy    Shellfish allergy  . Hypercholesterolemia   . Hypertension   . Stroke [facility])     Past Surgical History:  Procedure Laterality Date  . APPENDECTOMY    . CATARACT EXTRACTION N/A   . I&D EXTREMITY Left 08/22/2013   Procedure: IRRIGATION AND DEBRIDEMENT EXTREMITY;  Surgeon: Tennis Must, MD;  Location: [place];  Service: Orthopedics;  Laterality: Left;  left ring finger  . [place] SURGERY  2011   in [place]   . PERCUTANEOUS PINNING Left 08/22/2013   Procedure: PERCUTANEOUS PINNING EXTREMITY;  Surgeon: Tennis Must, MD;  Location: [place];  Service: Orthopedics;  Laterality: Left;  left ring finger  . TOOTH EXTRACTION      Social History   Tobacco Use  . Smoking status: Former Smoker    Types: Cigarettes     Last attempt to quit: 04/23/2005    Years since quitting: 13.3  . Smokeless tobacco: Never Used  Substance Use Topics  . Alcohol use: No    Alcohol/week: 0.0 standard drinks    Family History  Problem Relation Age of Onset  . Hypertension Mother   . Hypertension Father     ROS  Per hpi  Objective  Vitals as reported by the patient: none   ASSESSMENT and PLAN  1. Hallucinations New. Started after CVA and hosp with mild cognitive deficits prior. Also in setting of new rx for pred and oxycodone.  Discussed with daughter that there are multiple causes of recent behavior changes. Will start addressing medications as possible cause. Will start slowly weaning back oxycodone to every 8 hours as needed and prednisone to 29m daily. Discussed strategies to help cope with behavior changes.   2. Abnormality of gait following cerebrovascular accident (CVA) Patient fully dependent on ADLs, 2  person assist. Orders for requested DME signed as these are medically appropriate and allow for safe care of patient.  3. Paroxysmal atrial fibrillation (HCC) On eliquis and metoprolol.   4. History of recent pneumonia Clinically resolved. Consider repeating CXR as advised next month.   5. Abnormal liver function 6. Positive anti-CCP test 7. Multiple joint pain Preliminary workup by GI favors rheum etiology. Has had first visit with rheum. Prednisone is most likely needed, however, wonder if contributing to hallucinations. Pending rheum further input.  8. Other eczema Continue with supportive care. Pending derm appt in June Stop doxepin as not helping with itchiness.  Other orders - oxyCODONE (ROXICODONE) 5 MG immediate release tablet; Take 1 tablet (5 mg total) by mouth every 8 (eight) hours as needed for moderate pain or severe pain. - predniSONE (DELTASONE) 20 MG tablet; Take 1.5 tablets (30 mg total) by mouth daily with breakfast for 7 days, THEN 1 tablet (20 mg total) daily with breakfast for  21 days.  FOLLOW-UP: 2 weeks.   The above assessment and management plan was discussed with the patient. The patient verbalized understanding of and has agreed to the management plan. Patient is aware to call the clinic if symptoms persist or worsen. Patient is aware when to return to the clinic for a follow-up visit. Patient educated on when it is appropriate to go to the emergency department.    I provided 32 minutes of non-face-to-face time during this encounter.  Rutherford Guys, MD [facility] at [facility] [place], [place] [address] Ph.  [phone] Fax [phone]

## 2018-09-03 ENCOUNTER — Telehealth: Payer: Medicare Other | Admitting: Family Medicine

## 2018-09-03 ENCOUNTER — Ambulatory Visit: Payer: Medicare Other | Admitting: Neurology

## 2018-09-03 MED ORDER — POLYETHYLENE GLYCOL 3350 17 GM/SCOOP PO POWD: 17.0000 g | Freq: Every day | ORAL | 3 refills | Status: AC

## 2018-09-03 NOTE — Telephone Encounter (Signed)
Gave verbal order

## 2018-09-03 NOTE — Telephone Encounter (Signed)
Please let her know that I sent in a prescription for miralax, she can adjust frequency as needed to desired effect. thanks

## 2018-09-03 NOTE — Telephone Encounter (Signed)
Mary speech therapist from [facility] [phone] checking on the status of verbal orders mentioned below. Please advise request was sent in on 08/28/2018

## 2018-09-03 NOTE — Telephone Encounter (Signed)
Please see note below. 

## 2018-09-05 ENCOUNTER — Telehealth: Payer: Self-pay

## 2018-09-05 NOTE — Telephone Encounter (Signed)
Forms faxed to [facility] for dme needs

## 2018-09-08 ENCOUNTER — Telehealth: Payer: Self-pay | Admitting: Family Medicine

## 2018-09-08 ENCOUNTER — Encounter (HOSPITAL_COMMUNITY): Payer: Self-pay | Admitting: Emergency Medicine

## 2018-09-08 ENCOUNTER — Emergency Department (HOSPITAL_COMMUNITY): Payer: Medicare Other

## 2018-09-08 ENCOUNTER — Inpatient Hospital Stay (HOSPITAL_COMMUNITY)
Admission: EM | Admit: 2018-09-08 | Discharge: 2018-09-11 | DRG: 064 | Disposition: A | Discharge status: Hospice | Payer: Medicare Other | Attending: Internal Medicine | Admitting: Internal Medicine

## 2018-09-08 ENCOUNTER — Ambulatory Visit: Payer: Self-pay | Admitting: *Deleted

## 2018-09-08 ENCOUNTER — Other Ambulatory Visit: Payer: Self-pay

## 2018-09-08 DIAGNOSIS — D649 Anemia, unspecified: Secondary | ICD-10-CM | POA: Diagnosis present

## 2018-09-08 DIAGNOSIS — J918 Pleural effusion in other conditions classified elsewhere: Secondary | ICD-10-CM | POA: Diagnosis present

## 2018-09-08 DIAGNOSIS — I959 Hypotension, unspecified: Secondary | ICD-10-CM | POA: Diagnosis present

## 2018-09-08 DIAGNOSIS — Z7901 Long term (current) use of anticoagulants: Secondary | ICD-10-CM

## 2018-09-08 DIAGNOSIS — R0902 Hypoxemia: Secondary | ICD-10-CM | POA: Diagnosis present

## 2018-09-08 DIAGNOSIS — Z91013 Allergy to seafood: Secondary | ICD-10-CM

## 2018-09-08 DIAGNOSIS — Z79899 Other long term (current) drug therapy: Secondary | ICD-10-CM

## 2018-09-08 DIAGNOSIS — R109 Unspecified abdominal pain: Secondary | ICD-10-CM

## 2018-09-08 DIAGNOSIS — E871 Hypo-osmolality and hyponatremia: Secondary | ICD-10-CM

## 2018-09-08 DIAGNOSIS — I69354 Hemiplegia and hemiparesis following cerebral infarction affecting left non-dominant side: Secondary | ICD-10-CM

## 2018-09-08 DIAGNOSIS — G9341 Metabolic encephalopathy: Secondary | ICD-10-CM | POA: Diagnosis present

## 2018-09-08 DIAGNOSIS — J90 Pleural effusion, not elsewhere classified: Secondary | ICD-10-CM | POA: Diagnosis present

## 2018-09-08 DIAGNOSIS — Z66 Do not resuscitate: Secondary | ICD-10-CM | POA: Diagnosis not present

## 2018-09-08 DIAGNOSIS — I10 Essential (primary) hypertension: Secondary | ICD-10-CM | POA: Diagnosis present

## 2018-09-08 DIAGNOSIS — Y95 Nosocomial condition: Secondary | ICD-10-CM | POA: Diagnosis present

## 2018-09-08 DIAGNOSIS — Z7401 Bed confinement status: Secondary | ICD-10-CM

## 2018-09-08 DIAGNOSIS — R918 Other nonspecific abnormal finding of lung field: Secondary | ICD-10-CM

## 2018-09-08 DIAGNOSIS — R509 Fever, unspecified: Secondary | ICD-10-CM

## 2018-09-08 DIAGNOSIS — I480 Paroxysmal atrial fibrillation: Secondary | ICD-10-CM | POA: Diagnosis not present

## 2018-09-08 DIAGNOSIS — I639 Cerebral infarction, unspecified: Principal | ICD-10-CM | POA: Diagnosis present

## 2018-09-08 DIAGNOSIS — Z87891 Personal history of nicotine dependence: Secondary | ICD-10-CM

## 2018-09-08 DIAGNOSIS — Z79891 Long term (current) use of opiate analgesic: Secondary | ICD-10-CM

## 2018-09-08 DIAGNOSIS — R627 Adult failure to thrive: Secondary | ICD-10-CM | POA: Diagnosis present

## 2018-09-08 DIAGNOSIS — R451 Restlessness and agitation: Secondary | ICD-10-CM

## 2018-09-08 DIAGNOSIS — K5900 Constipation, unspecified: Secondary | ICD-10-CM | POA: Diagnosis present

## 2018-09-08 DIAGNOSIS — R7989 Other specified abnormal findings of blood chemistry: Secondary | ICD-10-CM | POA: Diagnosis present

## 2018-09-08 DIAGNOSIS — Z7951 Long term (current) use of inhaled steroids: Secondary | ICD-10-CM

## 2018-09-08 DIAGNOSIS — Z681 Body mass index (BMI) 19 or less, adult: Secondary | ICD-10-CM

## 2018-09-08 DIAGNOSIS — J189 Pneumonia, unspecified organism: Secondary | ICD-10-CM | POA: Diagnosis present

## 2018-09-08 DIAGNOSIS — Z7189 Other specified counseling: Secondary | ICD-10-CM

## 2018-09-08 DIAGNOSIS — Z8249 Family history of ischemic heart disease and other diseases of the circulatory system: Secondary | ICD-10-CM

## 2018-09-08 DIAGNOSIS — E7800 Pure hypercholesterolemia, unspecified: Secondary | ICD-10-CM | POA: Diagnosis present

## 2018-09-08 DIAGNOSIS — J9811 Atelectasis: Secondary | ICD-10-CM | POA: Diagnosis present

## 2018-09-08 DIAGNOSIS — R1084 Generalized abdominal pain: Secondary | ICD-10-CM

## 2018-09-08 DIAGNOSIS — Z23 Encounter for immunization: Secondary | ICD-10-CM

## 2018-09-08 DIAGNOSIS — R1310 Dysphagia, unspecified: Secondary | ICD-10-CM | POA: Diagnosis present

## 2018-09-08 DIAGNOSIS — Z7952 Long term (current) use of systemic steroids: Secondary | ICD-10-CM

## 2018-09-08 DIAGNOSIS — E785 Hyperlipidemia, unspecified: Secondary | ICD-10-CM | POA: Diagnosis present

## 2018-09-08 DIAGNOSIS — E43 Unspecified severe protein-calorie malnutrition: Secondary | ICD-10-CM | POA: Diagnosis present

## 2018-09-08 DIAGNOSIS — Z515 Encounter for palliative care: Secondary | ICD-10-CM

## 2018-09-08 DIAGNOSIS — Z1159 Encounter for screening for other viral diseases: Secondary | ICD-10-CM

## 2018-09-08 DIAGNOSIS — Z8701 Personal history of pneumonia (recurrent): Secondary | ICD-10-CM

## 2018-09-08 DIAGNOSIS — R1011 Right upper quadrant pain: Secondary | ICD-10-CM | POA: Diagnosis present

## 2018-09-08 LAB — CBC WITH DIFFERENTIAL/PLATELET
Abs Immature Granulocytes: 0.09 10*3/uL — ABNORMAL HIGH (ref 0.00–0.07)
Basophils Absolute: 0 10*3/uL (ref 0.0–0.1)
Basophils Relative: 0 %
Eosinophils Absolute: 0 10*3/uL (ref 0.0–0.5)
Eosinophils Relative: 0 %
HCT: 36.3 % — ABNORMAL LOW (ref 39.0–52.0)
Hemoglobin: 11.6 g/dL — ABNORMAL LOW (ref 13.0–17.0)
Immature Granulocytes: 1 %
Lymphocytes Relative: 4 %
Lymphs Abs: 0.5 10*3/uL — ABNORMAL LOW (ref 0.7–4.0)
MCH: 23.5 pg — ABNORMAL LOW (ref 26.0–34.0)
MCHC: 32 g/dL (ref 30.0–36.0)
MCV: 73.5 fL — ABNORMAL LOW (ref 80.0–100.0)
Monocytes Absolute: 0.2 10*3/uL (ref 0.1–1.0)
Monocytes Relative: 1 %
Neutro Abs: 12.9 10*3/uL — ABNORMAL HIGH (ref 1.7–7.7)
Neutrophils Relative %: 94 %
Platelets: 323 10*3/uL (ref 150–400)
RBC: 4.94 MIL/uL (ref 4.22–5.81)
RDW: 21.2 % — ABNORMAL HIGH (ref 11.5–15.5)
WBC: 13.7 10*3/uL — ABNORMAL HIGH (ref 4.0–10.5)
nRBC: 0 % (ref 0.0–0.2)

## 2018-09-08 LAB — HEPATIC FUNCTION PANEL
ALT: 95 U/L — ABNORMAL HIGH (ref 0–44)
AST: 139 U/L — ABNORMAL HIGH (ref 15–41)
Albumin: 2.5 g/dL — ABNORMAL LOW (ref 3.5–5.0)
Alkaline Phosphatase: 72 U/L (ref 38–126)
Bilirubin, Direct: 0.2 mg/dL (ref 0.0–0.2)
Indirect Bilirubin: 0.9 mg/dL (ref 0.3–0.9)
Total Bilirubin: 1.1 mg/dL (ref 0.3–1.2)
Total Protein: 5.7 g/dL — ABNORMAL LOW (ref 6.5–8.1)

## 2018-09-08 LAB — LIPASE, BLOOD: Lipase: 36 U/L (ref 11–51)

## 2018-09-08 LAB — BASIC METABOLIC PANEL
Anion gap: 12 (ref 5–15)
BUN: 14 mg/dL (ref 8–23)
CO2: 23 mmol/L (ref 22–32)
Calcium: 8.5 mg/dL — ABNORMAL LOW (ref 8.9–10.3)
Chloride: 97 mmol/L — ABNORMAL LOW (ref 98–111)
Creatinine, Ser: 0.45 mg/dL — ABNORMAL LOW (ref 0.61–1.24)
GFR calc Af Amer: >60 mL/min (ref >60)
GFR calc non Af Amer: >60 mL/min (ref >60)
Glucose, Bld: 123 mg/dL — ABNORMAL HIGH (ref 70–99)
Potassium: 4.4 mmol/L (ref 3.5–5.1)
Sodium: 132 mmol/L — ABNORMAL LOW (ref 135–145)

## 2018-09-08 LAB — SARS CORONAVIRUS 2 BY RT PCR (HOSPITAL ORDER, PERFORMED IN ~~LOC~~ HOSPITAL LAB): SARS Coronavirus 2: NEGATIVE

## 2018-09-08 MED ORDER — ACETAMINOPHEN 650 MG RE SUPP
650.0000 mg | RECTAL | Status: DC | PRN
  Administered 2018-09-09: 650 mg via RECTAL
  Filled 2018-09-08: qty 1

## 2018-09-08 MED ORDER — LEVOFLOXACIN IN D5W 750 MG/150ML IV SOLN
750.0000 mg | INTRAVENOUS | Status: DC
  Administered 2018-09-09 – 2018-09-10 (×3): 750 mg via INTRAVENOUS
  Filled 2018-09-08 (×4): qty 150

## 2018-09-08 MED ORDER — SENNOSIDES-DOCUSATE SODIUM 8.6-50 MG PO TABS
2.0000 | ORAL_TABLET | Freq: Every evening | ORAL | Status: DC | PRN
  Administered 2018-09-10: 2 via ORAL
  Filled 2018-09-08: qty 2

## 2018-09-08 MED ORDER — POLYETHYLENE GLYCOL 3350 17 G PO PACK
17.0000 g | PACK | Freq: Every day | ORAL | Status: DC
  Administered 2018-09-09 – 2018-09-11 (×3): 17 g via ORAL
  Filled 2018-09-08 (×3): qty 1

## 2018-09-08 MED ORDER — STROKE: EARLY STAGES OF RECOVERY BOOK
Freq: Once | Status: AC
  Administered 2018-09-08
  Filled 2018-09-08: qty 1

## 2018-09-08 MED ORDER — SODIUM CHLORIDE 0.9 % IV SOLN
INTRAVENOUS | Status: DC
  Administered 2018-09-08 – 2018-09-09 (×2): via INTRAVENOUS

## 2018-09-08 MED ORDER — ONDANSETRON HCL 4 MG/2ML IJ SOLN
4.0000 mg | Freq: Four times a day (QID) | INTRAMUSCULAR | Status: DC | PRN
  Administered 2018-09-09: 4 mg via INTRAVENOUS
  Filled 2018-09-08: qty 2

## 2018-09-08 MED ORDER — ACETAMINOPHEN 160 MG/5ML PO SOLN: 650.0000 mg | ORAL | Status: DC | PRN

## 2018-09-08 MED ORDER — IOHEXOL 300 MG/ML  SOLN
100.0000 mL | Freq: Once | INTRAMUSCULAR | Status: AC | PRN
  Administered 2018-09-08: 100 mL via INTRAVENOUS

## 2018-09-08 MED ORDER — ACETAMINOPHEN 325 MG PO TABS
650.0000 mg | ORAL_TABLET | ORAL | Status: DC | PRN
  Administered 2018-09-09 (×2): 650 mg via ORAL
  Filled 2018-09-08 (×2): qty 2

## 2018-09-08 MED ORDER — FLUTICASONE PROPIONATE 50 MCG/ACT NA SUSP: 1.0000 | Freq: Two times a day (BID) | NASAL | Status: DC | PRN

## 2018-09-08 MED ORDER — ATORVASTATIN CALCIUM 40 MG PO TABS
40.0000 mg | ORAL_TABLET | Freq: Every day | ORAL | Status: DC
  Administered 2018-09-09 – 2018-09-10 (×2): 40 mg via ORAL
  Filled 2018-09-08 (×2): qty 1

## 2018-09-08 MED ORDER — PANTOPRAZOLE SODIUM 40 MG PO TBEC
40.0000 mg | DELAYED_RELEASE_TABLET | Freq: Every day | ORAL | Status: DC
  Administered 2018-09-09 – 2018-09-11 (×3): 40 mg via ORAL
  Filled 2018-09-08 (×3): qty 1

## 2018-09-08 MED ORDER — CYANOCOBALAMIN 500 MCG PO TABS
500.0000 ug | ORAL_TABLET | Freq: Every day | ORAL | Status: DC
  Administered 2018-09-09 – 2018-09-10 (×2): 500 ug via ORAL
  Filled 2018-09-08 (×3): qty 1

## 2018-09-08 NOTE — H&P (Addendum)
History and Physical    Nathan Bruce TOI:[id number] DOB: Aug 07, 1945 DOA: 09/08/2018  PCP: Rutherford Guys, MD Patient coming from: Home  Chief Complaint: Low oxygen saturation  HPI: Nathan Bruce is a 73 y.o. male with medical history significant of paroxysmal atrial fibrillation, hypertension, hyperlipidemia, recent admission on August 18, 2018 for CVA with residual left hemiplegia being brought to the hospital by EMS for evaluation of hypoxia.  Per EMS report, patient is nonverbal at baseline.  His wife called EMS because she noticed home oxygen saturation reading in the 60s.  Oxygen saturation checked by EMS 100% on room air.  Patient speaks a dialect of [place].  He is confused and disoriented.  History provided by daughter at bedside who prefers not using interpreter services at this time.  Daughter states that she brought the patient into the hospital today as she was checking his vitals and noticed his oxygen saturation was low at 68%.  She did not notice any shortness of breath.  States patient had a stroke in January in [place] which made his legs weak but he was able to recover after that.  Then he had another stroke at the end of April which left him with left-sided weakness.  States since this recent stroke patient has been very confused at home.  He is having hallucinations, screaming, and not recognizing family members.  He is eating very little.  This is a significant change from his baseline.  Daughter states sometimes patient points to his abdomen and complains of pain.  He sometimes vomits a small amount of mucus after eating or taking a medication.  Daughter is concerned that patient has lost about 50 pounds since January.  She has not noticed any fevers, cough, or change in urine output.  Patient was recently admitted from April 26 to May 1 for acute CVA which was thought to be embolic as he was noted to be in A. fib on telemetry.  Eliquis was started for anticoagulation.  Also treated  for left lower lobe pneumonia with ceftriaxone and azithromycin inpatient and then amoxicillin at discharge for 3 days.  ED Course: Afebrile, not tachycardic, not tachypneic, and not hypoxic.  Blood pressure 150/63 on arrival.  White count 13.7.  Hemoglobin 11.6, at baseline.  Platelet count 323. Sodium 132, potassium 4.4, chloride 97, bicarb 23, BUN 14, creatinine 0.4, and glucose 123.  AST 139 and ALT 95.  Alk phos and T bili normal.  AST and ALT previously elevated as well.  Lipase normal.  COVID-19 rapid test negative.  Chest x-ray (personally reviewed) showing increasing left basilar infiltrate with bilateral effusions.  CT abdomen pelvis showing Moderate to large bilateral pleural effusions. 2. Mild-to-moderate anasarca. 3. Large amount of stool in the rectum. 4. Nonobstructing stone in the upper pole the left kidney. No hydronephrosis. 5. Mild gallbladder wall thickening which is nonspecific and can be seen in the presence of ascites or heart failure. Right upper quadrant ultrasound showing persistent gallbladder wall thickening and no definite gallstone and absence of positive sonographic Murphy sign. CT head showing left posterior parietal low-density which is significantly enlarged from prior exam consistent with acute infarction.  Also showing interval development of probable petechial hemorrhage involving the cortical gyri of the subacute right occipital infarction noted on prior exam. Pending labs: UA, urine culture No medications were ordered in the ED.   Review of Systems: All systems reviewed and apart from history of presenting illness, are negative.  Past Medical History:  Diagnosis Date   Arthritis    knees   Food allergy    Shellfish allergy   Hypercholesterolemia    Hypertension    Stroke [facility])     Past Surgical History:  Procedure Laterality Date   APPENDECTOMY     CATARACT EXTRACTION N/A    I&D EXTREMITY Left 08/22/2013   Procedure: IRRIGATION AND  DEBRIDEMENT EXTREMITY;  Surgeon: Tennis Must, MD;  Location: [place];  Service: Orthopedics;  Laterality: Left;  left ring finger   LUMBAR [place] [facility]ERY  2011   in [place]    PERCUTANEOUS PINNING Left 08/22/2013   Procedure: PERCUTANEOUS PINNING EXTREMITY;  Surgeon: Tennis Must, MD;  Location: [place];  Service: Orthopedics;  Laterality: Left;  left ring finger   TOOTH EXTRACTION       reports that he quit smoking about 13 years ago. His smoking use included cigarettes. He has never used smokeless tobacco. He reports that he does not drink alcohol or use drugs.  Allergies  Allergen Reactions   Shellfish Allergy Itching and Rash    Mild    Family History  Problem Relation Age of Onset   Hypertension Mother    Hypertension Father     Prior to Admission medications   Medication Sig Start Date End Date Taking? Authorizing Provider  acetaminophen (TYLENOL) 500 MG tablet Take 500 mg by mouth every 6 (six) hours as needed for mild pain, fever or headache.    Yes [provider]  apixaban (ELIQUIS) 5 MG TABS tablet Take 1 tablet (5 mg total) by mouth 2 (two) times daily. 08/26/18  Yes Marcial Pacas, MD  Ensure (ENSURE) Take 1 Can by mouth 2 (two) times daily between meals.   Yes [provider]  fluticasone (FLONASE) 50 MCG/ACT nasal spray Place 1 spray into both nostrils 2 (two) times daily. Patient taking differently: Place 1 spray into both nostrils as needed for allergies or rhinitis.  07/24/18  Yes Rutherford Guys, MD  guaiFENesin (MUCINEX) 600 MG 12 hr tablet Take 600 mg by mouth 2 (two) times daily.   Yes [provider]  metoprolol tartrate (LOPRESSOR) 25 MG tablet Take 0.5 tablets (12.5 mg total) by mouth 2 (two) times daily. 08/22/18  Yes Domenic Polite, MD  omeprazole (PRILOSEC) 40 MG capsule Take 1 capsule (40 mg total) by mouth daily. 07/30/18  Yes Armbruster, Carlota Raspberry, MD  oxyCODONE (ROXICODONE) 5 MG immediate release tablet Take 1 tablet (5 mg total) by  mouth every 8 (eight) hours as needed for moderate pain or severe pain. 08/29/18  Yes Rutherford Guys, MD  polyethylene glycol powder (GLYCOLAX/MIRALAX) 17 GM/SCOOP powder Take 17 g by mouth daily. Patient taking differently: Take 17 g by mouth as needed for mild constipation or moderate constipation.  09/03/18  Yes Rutherford Guys, MD  predniSONE (DELTASONE) 20 MG tablet Take 1.5 tablets (30 mg total) by mouth daily with breakfast for 7 days, THEN 1 tablet (20 mg total) daily with breakfast for 21 days. 08/29/18 09/26/18 Yes Rutherford Guys, MD  vitamin B-12 (CYANOCOBALAMIN) 500 MCG tablet Take 500 mcg by mouth daily.   Yes [provider]  atorvastatin (LIPITOR) 40 MG tablet Take 1 tablet (40 mg total) by mouth daily at 6 PM. Patient not taking: Reported on 08/23/2018 08/22/18   Domenic Polite, MD    Physical Exam: Vitals:   09/08/18 1745 09/08/18 1800 09/08/18 1900 09/08/18 2257  BP: (!) 132/57 (!) 160/71 (!) 157/79 (!) 141/55  Pulse: 64 89 90 75  Resp:    15  Temp:      TempSrc:      SpO2: 98% 99% 100% 98%  Weight:    49.9 kg  Height:    '5\' 6"'  (1.676 m)    Physical Exam  HENT:  Head: Normocephalic.  Dry mucous membranes  Eyes: Pupils are equal, round, and reactive to light. Right eye exhibits no discharge. Left eye exhibits no discharge.  Neck: Neck supple.  Cardiovascular: Normal rate, regular rhythm and intact distal pulses.  Pulmonary/Chest: Effort normal. No respiratory distress. He has no wheezes. He has no rales.  Decreased breath sounds at the bases  Abdominal: Soft. Bowel sounds are normal. He exhibits no distension. There is abdominal tenderness. There is guarding. There is no rebound.  Generalized tenderness to palpation with guarding  Musculoskeletal:        General: Edema present.     Comments: +3 pitting edema of bilateral lower extremities up to mid shin level  Neurological:  Awake and alert, disoriented Following commands intermittently Moving right lower  extremity on command Strength 5 out of 5 in the right upper extremity and 4 out of 5 in the left upper extremity  Skin: Skin is warm and dry. He is not diaphoretic.     Labs on Admission: I have personally reviewed following labs and imaging studies  CBC: Recent Labs  Lab 09/08/18 1320  WBC 13.7*  NEUTROABS 12.9*  HGB 11.6*  HCT 36.3*  MCV 73.5*  PLT 818   Basic Metabolic Panel: Recent Labs  Lab 09/08/18 1320  NA 132*  K 4.4  CL 97*  CO2 23  GLUCOSE 123*  BUN 14  CREATININE 0.45*  CALCIUM 8.5*   GFR: Estimated Creatinine Clearance: 57.2 mL/min (A) (by C-G formula based on SCr of 0.45 mg/dL (L)). Liver Function Tests: Recent Labs  Lab 09/08/18 1515  AST 139*  ALT 95*  ALKPHOS 72  BILITOT 1.1  PROT 5.7*  ALBUMIN 2.5*   Recent Labs  Lab 09/08/18 1515  LIPASE 36   No results for input(s): AMMONIA in the last 168 hours. Coagulation Profile: No results for input(s): INR, PROTIME in the last 168 hours. Cardiac Enzymes: No results for input(s): CKTOTAL, CKMB, CKMBINDEX, TROPONINI in the last 168 hours. BNP (last 3 results) No results for input(s): PROBNP in the last 8760 hours. HbA1C: No results for input(s): HGBA1C in the last 72 hours. CBG: No results for input(s): GLUCAP in the last 168 hours. Lipid Profile: No results for input(s): CHOL, HDL, LDLCALC, TRIG, CHOLHDL, LDLDIRECT in the last 72 hours. Thyroid Function Tests: No results for input(s): TSH, T4TOTAL, FREET4, T3FREE, THYROIDAB in the last 72 hours. Anemia Panel: No results for input(s): VITAMINB12, FOLATE, FERRITIN, TIBC, IRON, RETICCTPCT in the last 72 hours. Urine analysis:    Component Value Date/Time   COLORURINE YELLOW 08/27/2018 0440   APPEARANCEUR CLEAR 08/27/2018 0440   APPEARANCEUR Clear 07/17/2018 1130   LABSPEC 1.017 08/27/2018 0440   PHURINE 5.0 08/27/2018 0440   GLUCOSEU NEGATIVE 08/27/2018 0440   HGBUR NEGATIVE 08/27/2018 0440   BILIRUBINUR NEGATIVE 08/27/2018 0440    BILIRUBINUR Negative 07/17/2018 1130   KETONESUR NEGATIVE 08/27/2018 0440   PROTEINUR NEGATIVE 08/27/2018 0440   UROBILINOGEN 0.2 06/03/2017 1510   UROBILINOGEN 0.2 09/29/2014 2120   NITRITE NEGATIVE 08/27/2018 0440   LEUKOCYTESUR NEGATIVE 08/27/2018 0440    Radiological Exams on Admission: Ct Head Wo Contrast  Result Date: 09/08/2018 CLINICAL DATA:  Altered  level of consciousness. EXAM: CT HEAD WITHOUT CONTRAST TECHNIQUE: Contiguous axial images were obtained from the base of the skull through the vertex without intravenous contrast. COMPARISON:  CT scan of Aug 27, 2018. FINDINGS: Brain: Significantly increased left posterior parietal low density is noted concerning for acute infarction. Stable right occipital infarction is noted consistent with subacute infarction. There does appear to be some degree of petechial hemorrhage now present within the right occipital infarction. Mild chronic ischemic white matter disease is noted. No midline shift is noted. Ventricular size is within normal limits. Vascular: No hyperdense vessel or unexpected calcification. Skull: Normal. Negative for fracture or focal lesion. Sinuses/Orbits: No acute finding. Other: None. IMPRESSION: Left posterior parietal low density noted on prior exam is significantly enlarged currently consistent with acute infarction. Also noted is interval development of probable petechial hemorrhage involving the cortical gyri of the subacute right occipital infarction noted on prior exam. Critical Value/emergent results were called by telephone at the time of interpretation on 09/08/2018 at 4:41 pm to Dr. Sherry Ruffing, who verbally acknowledged these results. Electronically Signed   By: Marijo Conception M.D.   On: 09/08/2018 16:42   Mr Brain Wo Contrast  Result Date: 09/08/2018 CLINICAL DATA:  73 y/o  M; delirium and altered mental status. EXAM: MRI HEAD WITHOUT CONTRAST TECHNIQUE: Multiplanar, multiecho pulse sequences of the brain and surrounding  structures were obtained without intravenous contrast. COMPARISON:  09/08/2018 CT head.  08/18/2018 MRI head. FINDINGS: Brain: New area of reduced diffusion centered within the left posterior parietal lobe spanning 4.7 x 3.5 x 4.8 cm (volume = 41 cm^3) compatible with acute infarction corresponding to hypodensity on the prior CT of head. There is susceptibility a vascular structures within the area of acute infarction may represent vessel enlargement and/or thrombus associated with the stroke (series 9, image 57). A component of petechial hemorrhage may be present. Multiple areas of diffusion hyperintensity on the prior MRI of the brain in the right greater than left cerebral hemispheres within the right ACA and PCA distributions as well as multiple left left-sided vascular territories now demonstrates mildly reduced diffusion on ADC compatible with expected evolution to subacute infarction. The areas of subacute infarction are T2 FLAIR hyperintense with mild local mass effect. Persistent minimal petechial hemorrhage within the right medial parietal lobe subacute infarction. New faint foci of susceptibility hypointensity within the left occipital lobe subacute infarct (series 9, image 55) compatible with interval petechial hemorrhage. There is been interval development of laminar necrosis within right medial frontal and parietal subacute infarctions as well as left parietal and occipital subacute infarctions. Small infarcts within the left cerebellar hemisphere and the right caudate head on the prior MRI no longer demonstrate reduced diffusion compatible with late subacute to chronic etiology. No hydrocephalus, herniation, significant mass effect, or large gross hemorrhage. Vascular: Normal flow voids. Skull and upper cervical spine: Normal marrow signal. Sinuses/Orbits: Negative. Other: Left intra-ocular lens replacement. IMPRESSION: 1. Interval development of an acute infarction centered in the left posterior  parietal lobe measuring up to 4.8 cm, 41 cc. Susceptibility blooming of vascular structures within the infarct may represent hyperemia and/or small vessel thrombus related to the stroke. Mild petechial hemorrhage not excluded. 2. Multiple now subacute infarctions in multiple vascular territories are stable in distribution from the prior MRI of the brain. Stable minimal petechial hemorrhage within the right medial parietal lobe infarction. New minimal petechial hemorrhage within the left occipital lobe infarction. 3. Small now late subacute to chronic infarcts in left cerebellar  hemisphere and right caudate head no longer have reduced diffusion. These results were called by telephone at the time of interpretation on 09/08/2018 at 10:15 pm to Dr. Marda Stalker , who verbally acknowledged these results. Electronically Signed   By: Kristine Garbe M.D.   On: 09/08/2018 22:18   Ct Abdomen Pelvis W Contrast  Result Date: 09/08/2018 CLINICAL DATA:  Low oxygen concentration.  Abdominal pain. EXAM: CT ABDOMEN AND PELVIS WITH CONTRAST TECHNIQUE: Multidetector CT imaging of the abdomen and pelvis was performed using the standard protocol following bolus administration of intravenous contrast. CONTRAST:  139m OMNIPAQUE IOHEXOL 300 MG/ML  SOLN COMPARISON:  09/29/2014 FINDINGS: Lower chest: There are moderate to large bilateral pleural effusions which are only partially visualized on this exam. There is adjacent atelectasis. Heart is mildly enlarged. Hepatobiliary: No focal liver abnormality is seen. There is mild gallbladder wall thickening. There is no evidence of biliary ductal dilatation. Pancreas: Unremarkable. No pancreatic ductal dilatation or surrounding inflammatory changes. Spleen: Normal in size without focal abnormality. Adrenals/Urinary Tract: The bilateral adrenal glands are unremarkable. The right kidney is unremarkable with no hydronephrosis. There is a 5-6 mm nonobstructing stone in the upper  pole of the left kidney. The bladder is unremarkable. Stomach/Bowel: There is a large amount of stool in the rectum. There is no evidence of a small-bowel obstruction. There is some mild gastric wall thickening. Vascular/Lymphatic: Aortic atherosclerosis. No enlarged abdominal or pelvic lymph nodes. 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 Reproductive: Prostate is unremarkable. Other: There is diffuse body wall edema. There is a trace amount of free fluid in the abdomen. Musculoskeletal: There are advanced degenerative changes throughout the visualized lumbar spine. There is no displaced fracture. IMPRESSION: 1. Moderate to large bilateral pleural effusions. 2. Mild-to-moderate anasarca. 3. Large amount of stool in the rectum. 4. Nonobstructing stone in the upper pole the left kidney. No hydronephrosis. 5. Mild gallbladder wall thickening which is nonspecific and can be seen in the presence of ascites or heart failure. If there is clinical concern for acute cholecystitis follow-up with ultrasound is recommended. Electronically Signed   By: CConstance HolsterM.D.   On: 09/08/2018 16:41   Dg Chest Port 1 View  Result Date: 09/08/2018 CLINICAL DATA:  Shortness of breath EXAM: PORTABLE CHEST 1 VIEW COMPARISON:  08/17/2018 FINDINGS: Cardiac shadow is mildly prominent but stable. Aortic calcifications are seen. Bilateral pleural effusions are noted as well as left lower lobe consolidation. This is increased from the prior exam. No pneumothorax is seen. No bony abnormality is noted. IMPRESSION: Increasing left basilar infiltrate with bilateral effusions. Electronically Signed   By: MInez CatalinaM.D.   On: 09/08/2018 12:14   [place]Abdomen Limited Ruq  Result Date: 09/08/2018 CLINICAL DATA:  Right upper quadrant abdominal pain with fever. EXAM: ULTRASOUND ABDOMEN LIMITED RIGHT UPPER QUADRANT COMPARISON:  CT from the same day.  Ultrasound dated 05/02 2020 FINDINGS:  Gallbladder: There is diffuse gallbladder wall thickening with the gallbladder wall measuring up to approximately 6 mm in width. No gallstones are identified. There is no biliary ductal dilatation. The sonographic MPercell Millersign is reported as negative. The gallbladder does appear to be somewhat distended. Common bile duct: Diameter: 2.5 mm Liver: No focal lesion identified. Within normal limits in parenchymal echogenicity. Portal vein is patent on color Doppler imaging with normal direction of blood flow towards the liver. A right-sided pleural effusion is again noted. IMPRESSION: 1. Persistent gallbladder wall thickening, however on today's exam no  definite gallstones are identified. In the absence of a positive sonographic Murphy sign, gallbladder wall thickening is nonspecific. 2. Right-sided pleural effusion again noted. Electronically Signed   By: Constance Holster M.D.   On: 09/08/2018 19:59    EKG: Independently reviewed.  Sinus rhythm, PVCs.  Assessment/Plan Principal Problem:   Acute CVA (cerebrovascular accident) [facility]) Active Problems:   Community acquired pneumonia   Pleural effusion, bilateral   Hyponatremia   Abdominal pain   Acute CVA Recently admitted on April 27 for stroke which left him with residual left-sided weakness.  Per family, patient has been very confused and disoriented since his recent stroke.  Brain MRI showing interval development of an acute infarction centered in the left posterior parietal lobe measuring up to 4.8 cm, 41 cc. Susceptibility blooming of vascular structures within the infarct may represent hyperemia and/or small vessel thrombus related to the stroke. Mild petechial hemorrhage not excluded.  Also showing multiple now subacute infarctions in multiple vascular territories, stable since prior MRI. Stable minimal petechial hemorrhage within the right medial parietal lobe infarction. New minimal petechial hemorrhage within the left occipital lobe infarction.  Small now late subacute to chronic infarcts in left cerebellar hemisphere and right caudate head.  -Discussed with neurology.  Patient's MRI findings are thought to be due to progression of his prior stroke.  Neurology will see the patient tonight.  Recommending Aspirin 325 mg daily. -Continue statin -Cardiac monitoring -PT/OT/SLP eval -Frequent neurochecks   CAP Patient is not hypoxic.  Afebrile.  White count 13.7. Chest x-ray (personally reviewed) showing increasing left basilar infiltrate. COVID-19 rapid test negative.  Patient was treated with ceftriaxone and azithromycin during recent hospitalization and discharged on p.o. amoxicillin. -Give IV Levaquin 750 mg daily -Continue to monitor CBC -Supplemental oxygen if needed  Bilateral pleural effusions Moderate to large in size per CT abdomen pelvis.  Patient is not hypoxic and has no increased work of breathing on exam.  BNP not impressive. -On Eliquis for paroxysmal atrial fibrillation. Will hold at this time.  Patient will need thoracentesis, discuss with IR in a.m.  Mild hyponatremia Sodium 132. -IV fluid hydration -Continue to monitor BMP  Abdominal pain Per family, patient intermittently complains of abdominal pain.  CT showing large amount of stool in the rectum.  Nonobstructing stone in the upper pole of the left kidney, no hydronephrosis.  Mild nonspecific gallbladder wall thickening.  Lipase normal.  Transaminases elevated, similar to prior labs. Right upper quadrant ultrasound showing persistent gallbladder wall thickening and no definite gallstone and absence of positive sonographic Murphy sign.  Patient's abdominal pain could possibly be due to constipation. -MiraLAX daily, Senokot S PRN  Paroxysmal atrial fibrillation Patient noted to be in A. fib during his recent hospitalization.  Currently in sinus rhythm.  Continue metoprolol for rate control.  Hold Eliquis overnight as patient will need thoracentesis for bilateral  pleural effusions.  Weight loss/elevated LFTs/concern for connective tissue disease Per prior labs, ANA negative, CCP positive (weak positive at 22), ESR 56.  Seen by GI on July 30, 2018 and EGD was recommended but not done at that time given COVID-19 outbreak and the practice not doing elective procedures.  Patient was referred to rheumatology at the time of discharge as he was also noted to have muscle tenderness and a skin rash. Per daughter, he was recently seen by rheumatology and they have not heard back about the lab results yet.  -Continue PPI for possible gastritis or peptic ulcer disease as previously recommended  by GI -Ensure outpatient GI and rheumatology follow-up  Hypertension -Blood pressure goal: normotension per neurology -Continue home metoprolol -Hydralazine PRN  DVT prophylaxis: SCDs Code Status: Full code.  Discussed with daughter at bedside. Family Communication: Daughter at bedside. Disposition Plan: Anticipate discharge in 1 to 2 days. Consults called: Neurology Admission status: It is my clinical opinion that referral for OBSERVATION is reasonable and necessary in this patient based on the above information provided. The aforementioned taken together are felt to place the patient at high risk for further clinical deterioration. However it is anticipated that the patient may be medically stable for discharge from the hospital within 24 to 48 hours.  The medical decision making on this patient was of high complexity and the patient is at high risk for clinical deterioration, therefore this is a level 3 visit.  Shela Leff MD [facility] Pager [phone]  If 7PM-7AM, please contact night-coverage [email or web address] Password [email or web address]  09/09/2018, 1:46 AM

## 2018-09-08 NOTE — Telephone Encounter (Signed)
Pt evaluated at the ER today for concerns.

## 2018-09-08 NOTE — Progress Notes (Signed)
Arrived from ED. Alert and oriented to person and time. Dtr at bedside translating. At baseline, pt's hallucinates, screams when in pain per dtr. Tele monitor in placed. Call light within reach.

## 2018-09-08 NOTE — ED Notes (Signed)
Spoke with daughter regarding plan of care. Patient continues to say "Hey" daughter will arrive in approximately 20 minutes to help communicate and comfort patient.

## 2018-09-08 NOTE — ED Provider Notes (Signed)
[facility] EMERGENCY DEPARTMENT Provider Note   CSN: [id number] Arrival date & time: 09/08/18  1020    History   Chief Complaint Chief Complaint  Patient presents with  . low oxygen saturation    HPI Nolan KOLLYN LINGAFELTER is a 73 y.o. male.     73 yo M with a chief complaints of hypoxia.  The patient's family had a pulse ox at home and said it was in the 60s.  Was sent here via EMS.  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  Level 5 caveat nonverbal.  The history is provided by the patient and the EMS personnel.  Illness  Severity:  Moderate Onset quality:  Sudden Duration:  1 day Timing:  Constant Progression:  Unchanged Chronicity:  New Associated symptoms: no abdominal pain, no chest pain, no congestion, no diarrhea, no fever, no headaches, no myalgias, no rash, no shortness of breath and no vomiting     Past Medical History:  Diagnosis Date  . Arthritis    knees  . Food allergy    Shellfish allergy  . Hypercholesterolemia   . Hypertension   . Stroke [facility])     Patient Active Problem List   Diagnosis Date Noted  . Paroxysmal atrial fibrillation (HCC) 08/29/2018  . Gait abnormality 08/26/2018  . Acute CVA (cerebrovascular accident) (HCC) 08/18/2018  . Community acquired pneumonia 08/18/2018  . Anemia 08/18/2018  . HTN (hypertension) 08/18/2018  . Hyperlipidemia 08/18/2018    Past Surgical History:  Procedure Laterality Date  . APPENDECTOMY    . CATARACT EXTRACTION N/A   . I&D EXTREMITY Left 08/22/2013   Procedure: IRRIGATION AND DEBRIDEMENT EXTREMITY;  Surgeon: Tami Ribas, MD;  Location: [facility] OR;  Service: Orthopedics;  Laterality: Left;  left ring finger  . LUMBAR DISC SURGERY  2011   in [place]   . PERCUTANEOUS PINNING Left 08/22/2013   Procedure: PERCUTANEOUS PINNING EXTREMITY;  Surgeon: Tami Ribas, MD;  Location: [facility] OR;  Service: Orthopedics;  Laterality: Left;  left ring finger  . TOOTH EXTRACTION           Home Medications    Prior to Admission medications   Medication Sig Start Date End Date Taking? Authorizing Provider  acetaminophen (TYLENOL) 500 MG tablet Take 500 mg by mouth every 6 (six) hours as needed for mild pain, fever or headache.    Yes [provider]  apixaban (ELIQUIS) 5 MG TABS tablet Take 1 tablet (5 mg total) by mouth 2 (two) times daily. 08/26/18  Yes Levert Feinstein, MD  Ensure (ENSURE) Take 1 Can by mouth 2 (two) times daily between meals.   Yes [provider]  fluticasone (FLONASE) 50 MCG/ACT nasal spray Place 1 spray into both nostrils 2 (two) times daily. Patient taking differently: Place 1 spray into both nostrils as needed for allergies or rhinitis.  07/24/18  Yes Myles Lipps, MD  guaiFENesin (MUCINEX) 600 MG 12 hr tablet Take 600 mg by mouth 2 (two) times daily.   Yes [provider]  metoprolol tartrate (LOPRESSOR) 25 MG tablet Take 0.5 tablets (12.5 mg total) by mouth 2 (two) times daily. 08/22/18  Yes Zannie Cove, MD  omeprazole (PRILOSEC) 40 MG capsule Take 1 capsule (40 mg total) by mouth daily. 07/30/18  Yes Armbruster, Willaim Rayas, MD  oxyCODONE (ROXICODONE) 5 MG immediate release tablet Take 1 tablet (5 mg total) by mouth every 8 (eight) hours as needed for moderate pain  or severe pain. 08/29/18  Yes Myles Lipps, MD  polyethylene glycol powder (GLYCOLAX/MIRALAX) 17 GM/SCOOP powder Take 17 g by mouth daily. Patient taking differently: Take 17 g by mouth as needed for mild constipation or moderate constipation.  09/03/18  Yes Myles Lipps, MD  predniSONE (DELTASONE) 20 MG tablet Take 1.5 tablets (30 mg total) by mouth daily with breakfast for 7 days, THEN 1 tablet (20 mg total) daily with breakfast for 21 days. 08/29/18 09/26/18 Yes Myles Lipps, MD  vitamin B-12 (CYANOCOBALAMIN) 500 MCG tablet Take 500 mcg by mouth daily.   Yes [provider]  atorvastatin (LIPITOR) 40 MG tablet Take 1 tablet (40 mg total) by mouth  daily at 6 PM. Patient not taking: Reported on 08/23/2018 08/22/18   Zannie Cove, MD    Family History Family History  Problem Relation Age of Onset  . Hypertension Mother   . Hypertension Father     Social History Social History   Tobacco Use  . Smoking status: Former Smoker    Types: Cigarettes    Last attempt to quit: 04/23/2005    Years since quitting: 13.3  . Smokeless tobacco: Never Used  Substance Use Topics  . Alcohol use: No    Alcohol/week: 0.0 standard drinks  . Drug use: No     Allergies   Shellfish allergy   Review of Systems Review of Systems  Constitutional: Negative for chills and fever.  HENT: Negative for congestion and facial swelling.   Eyes: Negative for discharge and visual disturbance.  Respiratory: Negative for shortness of breath.   Cardiovascular: Negative for chest pain and palpitations.  Gastrointestinal: Negative for abdominal pain, diarrhea and vomiting.  Musculoskeletal: Negative for arthralgias and myalgias.  Skin: Negative for color change and rash.  Neurological: Negative for tremors, syncope and headaches.  Psychiatric/Behavioral: Negative for confusion and dysphoric mood.     Physical Exam Updated Vital Signs BP (!) 148/57   Pulse (!) 56   Temp 98.2 F (36.8 C) (Oral)   Resp 14   SpO2 100%   Physical Exam Vitals signs and nursing note reviewed.  Constitutional:      Appearance: He is well-developed.  HENT:     Head: Normocephalic and atraumatic.  Eyes:     Pupils: Pupils are equal, round, and reactive to light.  Neck:     Musculoskeletal: Normal range of motion and neck supple.     Vascular: No JVD.  Cardiovascular:     Rate and Rhythm: Normal rate and regular rhythm.     Heart sounds: No murmur. No friction rub. No gallop.   Pulmonary:     Effort: No respiratory distress.     Breath sounds: No wheezing.  Abdominal:     General: There is no distension.     Tenderness: There is abdominal tenderness (diffuse).  There is no guarding or rebound.  Musculoskeletal: Normal range of motion.  Skin:    Coloration: Skin is not pale.     Findings: No rash.  Neurological:     Mental Status: He is alert.      ED Treatments / Results  Labs (all labs ordered are listed, but only abnormal results are displayed) Labs Reviewed  NOVEL CORONAVIRUS, NAA (HOSPITAL ORDER, SEND-OUT TO REF LAB)  CBC WITH DIFFERENTIAL/PLATELET  BASIC METABOLIC PANEL    EKG EKG Interpretation  Date/Time:  Monday Sep 08 2018 10:31:41 EDT Ventricular Rate:  57 PR Interval:    QRS Duration: 96 QT Interval:  446 QTC Calculation: 431 R Axis:   105 Text Interpretation:  Right and left arm electrode reversal, interpretation assumes no reversal Sinus rhythm Probable anteroseptal infarct, recent Partial missing lead(s): V6 No significant change since last tracing Confirmed by Melene Plan [phone]) on 09/08/2018 11:04:20 AM   Radiology No results found.  Procedures Procedures (including critical care time)  Medications Ordered in ED Medications - No data to display   Initial Impression / Assessment and Plan / ED Course  I have reviewed the triage vital signs and the nursing notes.  Pertinent labs & imaging results that were available during my care of the patient were reviewed by me and considered in my medical decision making (see chart for details).        73 yo M with a chief complaints of hypoxia.  This may have been mechanical air as the patient's oxygen saturation with EMS and thus has been 100% on room air.  Will obtain a chest x-ray with reported hypoxia basic lab work.  Patient has had no hypoxia here.  He has a mild leukocytosis.  He has a left lower lobe infiltrate that is larger than previously seen.  He however has not had any coughing or shortness of breath since he is been here.  Last chest x-ray that in our system also showed a left lower lobe infiltrate.  He had a CT scan after the fact that did not show a  acute pneumonia.  I am not sure if this is a chronic atelectasis for him.  Family arrived and provided further history.  States that he is also been complaining of abdominal pain off and on.  Looks like he also has had prior CT imaging and some concern for gallbladder pathology.  I did not appreciate any focal abdominal tenderness on my initial exam and repeat exam is similar though seems more tender with family present.  Will obtain LFTs and lipase as well as a CT scan of the abdomen and pelvis.  Patient care is signed out to Dr. Rush Landmark please see his note for further details of care.  The patients results and plan were reviewed and discussed.   Any x-rays performed were independently reviewed by myself.   Differential diagnosis were considered with the presenting HPI.  Medications  atorvastatin (LIPITOR) tablet 40 mg (has no administration in time range)  pantoprazole (PROTONIX) EC tablet 40 mg (40 mg Oral Given 09/09/18 0901)  vitamin B-12 (CYANOCOBALAMIN) tablet 500 mcg (500 mcg Oral Given 09/09/18 0901)  fluticasone (FLONASE) 50 MCG/ACT nasal spray 1 spray (has no administration in time range)  0.9 %  sodium chloride infusion ( Intravenous New Bag/Given 09/08/18 2351)  acetaminophen (TYLENOL) tablet 650 mg (650 mg Oral Given 09/09/18 0857)    Or  acetaminophen (TYLENOL) solution 650 mg ( Per Tube See Alternative 09/09/18 0857)    Or  acetaminophen (TYLENOL) suppository 650 mg ( Rectal See Alternative 09/09/18 0857)  senna-docusate (Senokot-S) tablet 2 tablet (has no administration in time range)  polyethylene glycol (MIRALAX / GLYCOLAX) packet 17 g (17 g Oral Given 09/09/18 0901)  levofloxacin (LEVAQUIN) IVPB 750 mg (750 mg Intravenous New Bag/Given 09/09/18 0005)  ondansetron (ZOFRAN) injection 4 mg (4 mg Intravenous Given 09/09/18 0240)  aspirin tablet 325 mg (325 mg Oral Given 09/09/18 0227)  oxyCODONE (Oxy IR/ROXICODONE) immediate release tablet 5 mg (5 mg Oral Given 09/09/18 0227)   metoprolol tartrate (LOPRESSOR) tablet 12.5 mg (12.5 mg Oral Given 09/09/18 0900)  hydrALAZINE (APRESOLINE) injection 5  mg (has no administration in time range)  pneumococcal 23 valent vaccine (PNU-IMMUNE) injection 0.5 mL (has no administration in time range)  iohexol (OMNIPAQUE) 300 MG/ML solution 100 mL (100 mLs Intravenous Contrast Given 09/08/18 1611)   stroke: mapping our early stages of recovery book ( Does not apply Given 09/08/18 2352)    Vitals:   09/08/18 1800 09/08/18 1900 09/08/18 2257 09/09/18 0632  BP: (!) 160/71 (!) 157/79 (!) 141/55 (!) 120/54  Pulse: 89 90 75 88  Resp:   15 17  Temp:    98.3 F (36.8 C)  TempSrc:    Axillary  SpO2: 99% 100% 98% 95%  Weight:   49.9 kg   Height:   5\' 6"  (1.676 m)     Final diagnoses:  Infiltrate of left lung present on chest x-ray  RUQ abdominal pain  Fever, unspecified fever cause  Generalized abdominal pain    Admission/ observation were discussed with the admitting physician, patient and/or family and they are comfortable with the plan.      Final Clinical Impressions(s) / ED Diagnoses   Final diagnoses:  None    ED Discharge Orders    None       Melene PlanFloyd, Kiera Hussey, DO 09/09/18 [id number]

## 2018-09-08 NOTE — Telephone Encounter (Signed)
Pt's daughter calling, pt present. Reports pt  "Very confused last night." States calling out, hallucinations, H/O S/P CVA. Daughter states "Much worse last night." States this AM O2 sat 68% and 67%  on room air. States all week pt had been running in the 90%'s. States she tested pulse oximeter  on herself and was accurate. Directed to call EMS now. Offered to call, states she will do so now.   Reason for Disposition . [1] Difficult to awaken or acting confused (e.g., disoriented, slurred speech) AND [2] present now AND [3] has diabetes (diabetes mellitus)  Answer Assessment - Initial Assessment Questions 1. LEVEL OF CONSCIOUSNESS: "How is he (she, the patient) acting right now?" (e.g., alert-oriented, confused, lethargic, stuporous, comatose)     Asleep 2. ONSET: "When did the confusion start?"  (minutes, hours, days)     More confused last night, "Very confused" 3. PATTERN "Does this come and go, or has it been constant since it started?"  "Is it present now?"     4. ALCOHOL or DRUGS: "Has he been drinking alcohol or taking any drugs?"      no 5. NARCOTIC MEDICATIONS: "Has he been receiving any narcotic medications?" (e.g., morphine, Vicodin)     6. CAUSE: "What do you think is causing the confusion?"       7. OTHER SYMPTOMS: "Are there any other symptoms?" (e.g., difficulty breathing, headache, fever, weakness)     O2 sat 68% on room air  Protocols used: CONFUSION - DELIRIUM-A-AH

## 2018-09-08 NOTE — ED Notes (Signed)
Patient transported to Ultrasound 

## 2018-09-08 NOTE — Telephone Encounter (Signed)
Copied from CRM [id number]. Topic: Quick Communication - See Telephone Encounter >> Sep 08, 2018  4:57 PM Lorrine Kin, NT wrote: CRM for notification. See Telephone encounter for: 09/08/18. Olegario Messier with [facility] calling and states that thye are needing more information regarding the orders that they received. States that they are needing patient demographics, face to face notes, and more information on the pressure ulcer that would qualify patient for the mattress. Please advise. Fax#: [phone] CB#: [phone]

## 2018-09-08 NOTE — ED Notes (Signed)
ED TO INPATIENT HANDOFF REPORT  ED Nurse Name and Phone #:  Patty 3  S Name/Age/Gender Nathan Bruce 73 y.o. male Room/Bed: 033C/033C  Code Status   Code Status: Prior  Home/SNF/Other Home CAO per his norm - pt's family   Triage Complete: Triage complete  Chief Complaint low stats  Triage Note Pt arrives EMS. Pt nonverbal at baseline. Pts wife called EMS bc she was getting his oxygen saturation at home and her machine was reading in the 60s. Ems couldn't get the saturation to read in the house but once they were in the truck EMS put pulse ox on his ear and pts oxygen saturation read 100% on RA.   Pt was treated last month for pneumonia and ems reported congestion in bottom lobes.   Pt lives with daughter # [phone]   Allergies Allergies  Allergen Reactions  . Shellfish Allergy Itching and Rash    Mild    Level of Care/Admitting Diagnosis ED Disposition    ED Disposition Condition Comment   Admit  Hospital Area: [facility] [[id number]]  Level of Care: Telemetry Medical [104]  I expect the patient will be discharged within 24 hours: No (not a candidate for 5C-Observation unit)  Covid Evaluation: N/A  Diagnosis: Acute CVA (cerebrovascular accident) [facility]) [[id number]]  Admitting Physician: John Giovanni [[id number]]  Attending Physician: John Giovanni [[id number]]  PT Class (Do Not Modify): Observation [104]  PT Acc Code (Do Not Modify): Observation [10022]       B Medical/Surgery History Past Medical History:  Diagnosis Date  . Arthritis    knees  . Food allergy    Shellfish allergy  . Hypercholesterolemia   . Hypertension   . Stroke [facility])    Past Surgical History:  Procedure Laterality Date  . APPENDECTOMY    . CATARACT EXTRACTION N/A   . I&D EXTREMITY Left 08/22/2013   Procedure: IRRIGATION AND DEBRIDEMENT EXTREMITY;  Surgeon: Tami Ribas, MD;  Location: [facility] OR;  Service: Orthopedics;  Laterality: Left;  left ring finger  .  LUMBAR DISC SURGERY  2011   in [place]   . PERCUTANEOUS PINNING Left 08/22/2013   Procedure: PERCUTANEOUS PINNING EXTREMITY;  Surgeon: Tami Ribas, MD;  Location: [facility] OR;  Service: Orthopedics;  Laterality: Left;  left ring finger  . TOOTH EXTRACTION       A IV Location/Drains/Wounds Patient Lines/Drains/Airways Status   Active Line/Drains/Airways    Name:   Placement date:   Placement time:   Site:   Days:   Peripheral IV 09/08/18 Left Antecubital   09/08/18    1515    Antecubital   less than 1          Intake/Output Last 24 hours No intake or output data in the 24 hours ending 09/08/18 2149  Labs/Imaging Results for orders placed or performed during the hospital encounter of 09/08/18 (from the past 48 hour(s))  CBC with Differential     Status: Abnormal   Collection Time: 09/08/18  1:20 PM  Result Value Ref Range   WBC 13.7 (H) 4.0 - 10.5 K/uL   RBC 4.94 4.22 - 5.81 MIL/uL   Hemoglobin 11.6 (L) 13.0 - 17.0 g/dL   HCT 09.3 (L) 11.2 - 16.2 %   MCV 73.5 (L) 80.0 - 100.0 fL   MCH 23.5 (L) 26.0 - 34.0 pg   MCHC 32.0 30.0 - 36.0 g/dL   RDW 44.6 (H) 95.0 - 72.2 %   Platelets 323 150 - 400  K/uL   nRBC 0.0 0.0 - 0.2 %   Neutrophils Relative % 94 %   Neutro Abs 12.9 (H) 1.7 - 7.7 K/uL   Lymphocytes Relative 4 %   Lymphs Abs 0.5 (L) 0.7 - 4.0 K/uL   Monocytes Relative 1 %   Monocytes Absolute 0.2 0.1 - 1.0 K/uL   Eosinophils Relative 0 %   Eosinophils Absolute 0.0 0.0 - 0.5 K/uL   Basophils Relative 0 %   Basophils Absolute 0.0 0.0 - 0.1 K/uL   Immature Granulocytes 1 %   Abs Immature Granulocytes 0.09 (H) 0.00 - 0.07 K/uL    Comment: Performed at [facility], [address] [place], [place] [address]  Basic metabolic panel     Status: Abnormal   Collection Time: 09/08/18  1:20 PM  Result Value Ref Range   Sodium 132 (L) 135 - 145 mmol/L   Potassium 4.4 3.5 - 5.1 mmol/L   Chloride 97 (L) 98 - 111 mmol/L   CO2 23 22 - 32 mmol/L   Glucose, Bld 123 (H) 70 - 99 mg/dL    BUN 14 8 - 23 mg/dL   Creatinine, Ser 6.04 (L) 0.61 - 1.24 mg/dL   Calcium 8.5 (L) 8.9 - 10.3 mg/dL   GFR calc non Af Amer >60 >60 mL/min   GFR calc Af Amer >60 >60 mL/min   Anion gap 12 5 - 15    Comment: Performed at [facility], [address], [place], [place] [address]  Hepatic function panel     Status: Abnormal   Collection Time: 09/08/18  3:15 PM  Result Value Ref Range   Total Protein 5.7 (L) 6.5 - 8.1 g/dL   Albumin 2.5 (L) 3.5 - 5.0 g/dL   AST 119 (H) 15 - 41 U/L   ALT 95 (H) 0 - 44 U/L   Alkaline Phosphatase 72 38 - 126 U/L   Total Bilirubin 1.1 0.3 - 1.2 mg/dL   Bilirubin, Direct 0.2 0.0 - 0.2 mg/dL   Indirect Bilirubin 0.9 0.3 - 0.9 mg/dL    Comment: Performed at [facility], [address] [place], [place] [address]  Lipase, blood     Status: None   Collection Time: 09/08/18  3:15 PM  Result Value Ref Range   Lipase 36 11 - 51 U/L    Comment: Performed at [facility], [address], [place], [place] [address]  SARS Coronavirus 2 (CEPHEID- Performed in [facility] hospital lab), Hosp Order     Status: None   Collection Time: 09/08/18  5:02 PM  Result Value Ref Range   SARS Coronavirus 2 NEGATIVE NEGATIVE    Comment: (NOTE) If result is NEGATIVE SARS-CoV-2 target nucleic acids are NOT DETECTED. The SARS-CoV-2 RNA is generally detectable in upper and lower  respiratory specimens during the acute phase of infection. The lowest  concentration of SARS-CoV-2 viral copies this assay can detect is 250  copies / mL. A negative result does not preclude SARS-CoV-2 infection  and should not be used as the sole basis for treatment or other  patient management decisions.  A negative result may occur with  improper specimen collection / handling, submission of specimen other  than nasopharyngeal swab, presence of viral mutation(s) within the  areas targeted by this assay, and inadequate number of viral copies  (<250 copies / mL). A negative result must  be combined with clinical  observations, patient history, and epidemiological information. If result  is POSITIVE SARS-CoV-2 target nucleic acids are DETECTED. The SARS-CoV-2 RNA is generally detectable in upper and lower  respiratory specimens dur ing the acute phase of infection.  Positive  results are indicative of active infection with SARS-CoV-2.  Clinical  correlation with patient history and other diagnostic information is  necessary to determine patient infection status.  Positive results do  not rule out bacterial infection or co-infection with other viruses. If result is PRESUMPTIVE POSTIVE SARS-CoV-2 nucleic acids MAY BE PRESENT.   A presumptive positive result was obtained on the submitted specimen  and confirmed on repeat testing.  While 2019 novel coronavirus  (SARS-CoV-2) nucleic acids may be present in the submitted sample  additional confirmatory testing may be necessary for epidemiological  and / or clinical management purposes  to differentiate between  SARS-CoV-2 and other Sarbecovirus currently known to infect humans.  If clinically indicated additional testing with an alternate test  methodology [id number]) is advised. The SARS-CoV-2 RNA is generally  detectable in upper and lower respiratory sp ecimens during the acute  phase of infection. The expected result is Negative. Fact Sheet for Patients:  BoilerBrush.com.cy Fact Sheet for Healthcare Providers: https://pope.com/ This test is not yet approved or cleared by the [place] FDA and has been authorized for detection and/or diagnosis of SARS-CoV-2 by FDA under an Emergency Use Authorization (EUA).  This EUA will remain in effect (meaning this test can be used) for the duration of the COVID-19 declaration under Section 564(b)(1) of the Act, 21 U.S.C. section 360bbb-3(b)(1), unless the authorization is terminated or revoked sooner. Performed at [facility], [address] [place], [place] [address]    Ct Head Wo Contrast  Result Date: 09/08/2018 CLINICAL DATA:  Altered level of consciousness. EXAM: CT HEAD WITHOUT CONTRAST TECHNIQUE: Contiguous axial images were obtained from the base of the skull through the vertex without intravenous contrast. COMPARISON:  CT scan of Aug 27, 2018. FINDINGS: Brain: Significantly increased left posterior parietal low density is noted concerning for acute infarction. Stable right occipital infarction is noted consistent with subacute infarction. There does appear to be some degree of petechial hemorrhage now present within the right occipital infarction. Mild chronic ischemic white matter disease is noted. No midline shift is noted. Ventricular size is within normal limits. Vascular: No hyperdense vessel or unexpected calcification. Skull: Normal. Negative for fracture or focal lesion. Sinuses/Orbits: No acute finding. Other: None. IMPRESSION: Left posterior parietal low density noted on prior exam is significantly enlarged currently consistent with acute infarction. Also noted is interval development of probable petechial hemorrhage involving the cortical gyri of the subacute right occipital infarction noted on prior exam. Critical Value/emergent results were called by telephone at the time of interpretation on 09/08/2018 at 4:41 pm to Dr. Rush Landmark, who verbally acknowledged these results. Electronically Signed   By: Lupita Raider M.D.   On: 09/08/2018 16:42   Ct Abdomen Pelvis W Contrast  Result Date: 09/08/2018 CLINICAL DATA:  Low oxygen concentration.  Abdominal pain. EXAM: CT ABDOMEN AND PELVIS WITH CONTRAST TECHNIQUE: Multidetector CT imaging of the abdomen and pelvis was performed using the standard protocol following bolus administration of intravenous contrast. CONTRAST:  OMNIPAQUE IOHEXOL 300 MG/ML  SOLN COMPARISON:  09/29/2014 FINDINGS: Lower chest: There are moderate to large bilateral pleural  effusions which are only partially visualized on this exam. There is adjacent atelectasis. Heart is mildly enlarged. Hepatobiliary: No focal liver abnormality is seen. There is mild gallbladder wall thickening. There is no  evidence of biliary ductal dilatation. Pancreas: Unremarkable. No pancreatic ductal dilatation or surrounding inflammatory changes. Spleen: Normal in size without focal abnormality. Adrenals/Urinary Tract: The bilateral adrenal glands are unremarkable. The right kidney is unremarkable with no hydronephrosis. There is a 5-6 mm nonobstructing stone in the upper pole of the left kidney. The bladder is unremarkable. Stomach/Bowel: There is a large amount of stool in the rectum. There is no evidence of a small-bowel obstruction. There is some mild gastric wall thickening. Vascular/Lymphatic: Aortic atherosclerosis. No enlarged abdominal or pelvic lymph nodes. 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 Reproductive: Prostate is unremarkable. Other: There is diffuse body wall edema. There is a trace amount of free fluid in the abdomen. Musculoskeletal: There are advanced degenerative changes throughout the visualized lumbar spine. There is no displaced fracture. IMPRESSION: 1. Moderate to large bilateral pleural effusions. 2. Mild-to-moderate anasarca. 3. Large amount of stool in the rectum. 4. Nonobstructing stone in the upper pole the left kidney. No hydronephrosis. 5. Mild gallbladder wall thickening which is nonspecific and can be seen in the presence of ascites or heart failure. If there is clinical concern for acute cholecystitis follow-up with ultrasound is recommended. Electronically Signed   By: Katherine Mantlehristopher  Green M.D.   On: 09/08/2018 16:41   Dg Chest Port 1 View  Result Date: 09/08/2018 CLINICAL DATA:  Shortness of breath EXAM: PORTABLE CHEST 1 VIEW COMPARISON:  08/17/2018 FINDINGS: Cardiac shadow is mildly prominent but stable. Aortic  calcifications are seen. Bilateral pleural effusions are noted as well as left lower lobe consolidation. This is increased from the prior exam. No pneumothorax is seen. No bony abnormality is noted. IMPRESSION: Increasing left basilar infiltrate with bilateral effusions. Electronically Signed   By: Alcide CleverMark  Lukens M.D.   On: 09/08/2018 12:14   [place] Abdomen Limited Ruq  Result Date: 09/08/2018 CLINICAL DATA:  Right upper quadrant abdominal pain with fever. EXAM: ULTRASOUND ABDOMEN LIMITED RIGHT UPPER QUADRANT COMPARISON:  CT from the same day.  Ultrasound dated 05/02 2020 FINDINGS: Gallbladder: There is diffuse gallbladder wall thickening with the gallbladder wall measuring up to approximately 6 mm in width. No gallstones are identified. There is no biliary ductal dilatation. The sonographic Eulah PontMurphy sign is reported as negative. The gallbladder does appear to be somewhat distended. Common bile duct: Diameter: 2.5 mm Liver: No focal lesion identified. Within normal limits in parenchymal echogenicity. Portal vein is patent on color Doppler imaging with normal direction of blood flow towards the liver. A right-sided pleural effusion is again noted. IMPRESSION: 1. Persistent gallbladder wall thickening, however on today's exam no definite gallstones are identified. In the absence of a positive sonographic Murphy sign, gallbladder wall thickening is nonspecific. 2. Right-sided pleural effusion again noted. Electronically Signed   By: Katherine Mantlehristopher  Green M.D.   On: 09/08/2018 19:59    Pending Labs Unresulted Labs (From admission, onward)    Start     Ordered   09/08/18 2047  Urinalysis, Routine w reflex microscopic  Once,   R     09/08/18 2046   09/08/18 2047  Urine culture  ONCE - STAT,   STAT     09/08/18 2046   09/08/18 1042  Novel Coronavirus,NAA,(SEND-OUT TO REF LAB - TAT 24-48 hrs); Hosp Order  (Asymptomatic Patients Labs)  Once,   R    Question:  Rule Out  Answer:  Yes   09/08/18 1041           Vitals/Pain Today's Vitals   09/08/18  1600 09/08/18 1745 09/08/18 1800 09/08/18 1900  BP: 127/62 (!) 132/57 (!) 160/71 (!) 157/79  Pulse: 63 64 89 90  Resp:      Temp:      TempSrc:      SpO2: 100% 98% 99% 100%  PainSc:        Isolation Precautions Droplet and Contact precautions  Medications Medications  iohexol (OMNIPAQUE) 300 MG/ML solution 100 mL (100 mLs Intravenous Contrast Given 09/08/18 1611)    Mobility non-ambulatory Moderate fall risk   Focused Assessments    R Recommendations: See Admitting Provider Note  Report given to:   Additional Notes:

## 2018-09-08 NOTE — ED Notes (Signed)
Patient transported to MRI 

## 2018-09-08 NOTE — ED Notes (Signed)
PT's daughter at bed side. 

## 2018-09-08 NOTE — ED Triage Notes (Signed)
Pt arrives EMS. Pt nonverbal at baseline. Pts wife called EMS bc she was getting his oxygen saturation at home and her machine was reading in the 60s. Ems couldn't get the saturation to read in the house but once they were in the truck EMS put pulse ox on his ear and pts oxygen saturation read 100% on RA.   Pt was treated last month for pneumonia and ems reported congestion in bottom lobes.   Pt lives with daughter # [phone]

## 2018-09-08 NOTE — ED Notes (Signed)
Pt daughter at bedside

## 2018-09-08 NOTE — ED Provider Notes (Signed)
Care assumed from Dr. Adela Lank.  At time of transfer care, patient is awaiting results of diagnostic work-up.  Patient was found to have hypoxia with oxygen saturations in the 60s with altered mental status, and was found to have abdominal pain on initial exam.  Patient has not had hypoxia here however there was x-ray showing concern for larger infiltrate.  Unclear if this is chronic atelectasis or new pneumonia.  With his abdominal pain and elevated LFTs, patient has CT scan of the abdomen and pelvis.   Given his altered mental status and the transient hypoxia with worsening opacity in his lungs, will await for rest of work-up will likely need to admit for transient hypoxia with likely pneumonia.  Anticipate admission after work-up.  4:42 PM Radiologist called to report that the patient's head CT is concerning for acute stroke.  1 area has worsened of prior stroke and he has some petechial hemorrhages seen as well.  Neurology will be consulted.  Neurology looked at the images and feel that this is not a new stroke.  They do agree with getting MRI to further evaluate however.  Due to patient's leukocytosis, elevated LFTs, fever, abdominal pain, CT was ordered.  The CT scan showed concern for possible gallbladder wall thickening which may be seen in ascites or heart failure or acute cholecystitis.  Ultrasound was recommended and ordered.  The ultrasound did not show evidence of acute cholecystitis and was nonspecific.  Patient CT scan also showed large bilateral pleural effusions with may have contributed to his intermittent hypoxia.  Due to the fevers, abdominal pain, still questionable development of acute cholecystitis with inconclusive imaging, his altered mental status with the new delirium per family, and the possibility of new stroke, patient will need to be admitted.  Patient is still waiting on urinalysis results and MRI.  Coronavirus test negative.   Patient's repeat MRI shows new areas of  stroke.  Neurology will now see the patient.    Patient will be admitted to hospitalist service for further management.  Neurology will see.   Clinical Impression: 1. Infiltrate of left lung present on chest x-ray   2. RUQ abdominal pain   3. Fever, unspecified fever cause   4. Generalized abdominal pain     Disposition: Admit  This note was prepared with assistance of Dragon voice recognition software. Occasional wrong-word or sound-a-like substitutions may have occurred due to the inherent limitations of voice recognition software.       , Canary Brim, MD 09/09/18 [phone]

## 2018-09-09 ENCOUNTER — Other Ambulatory Visit: Payer: Self-pay

## 2018-09-09 DIAGNOSIS — R451 Restlessness and agitation: Secondary | ICD-10-CM | POA: Diagnosis not present

## 2018-09-09 DIAGNOSIS — E7800 Pure hypercholesterolemia, unspecified: Secondary | ICD-10-CM | POA: Diagnosis present

## 2018-09-09 DIAGNOSIS — Z7189 Other specified counseling: Secondary | ICD-10-CM | POA: Diagnosis not present

## 2018-09-09 DIAGNOSIS — R1011 Right upper quadrant pain: Secondary | ICD-10-CM | POA: Diagnosis present

## 2018-09-09 DIAGNOSIS — I959 Hypotension, unspecified: Secondary | ICD-10-CM | POA: Diagnosis present

## 2018-09-09 DIAGNOSIS — Y95 Nosocomial condition: Secondary | ICD-10-CM | POA: Diagnosis not present

## 2018-09-09 DIAGNOSIS — I639 Cerebral infarction, unspecified: Principal | ICD-10-CM

## 2018-09-09 DIAGNOSIS — J189 Pneumonia, unspecified organism: Secondary | ICD-10-CM | POA: Diagnosis present

## 2018-09-09 DIAGNOSIS — J90 Pleural effusion, not elsewhere classified: Secondary | ICD-10-CM

## 2018-09-09 DIAGNOSIS — Z1159 Encounter for screening for other viral diseases: Secondary | ICD-10-CM | POA: Diagnosis not present

## 2018-09-09 DIAGNOSIS — Z23 Encounter for immunization: Secondary | ICD-10-CM | POA: Diagnosis present

## 2018-09-09 DIAGNOSIS — Z66 Do not resuscitate: Secondary | ICD-10-CM | POA: Diagnosis not present

## 2018-09-09 DIAGNOSIS — Z515 Encounter for palliative care: Secondary | ICD-10-CM | POA: Diagnosis not present

## 2018-09-09 DIAGNOSIS — E871 Hypo-osmolality and hyponatremia: Secondary | ICD-10-CM | POA: Diagnosis present

## 2018-09-09 DIAGNOSIS — R918 Other nonspecific abnormal finding of lung field: Secondary | ICD-10-CM

## 2018-09-09 DIAGNOSIS — R1310 Dysphagia, unspecified: Secondary | ICD-10-CM | POA: Diagnosis present

## 2018-09-09 DIAGNOSIS — Z8673 Personal history of transient ischemic attack (TIA), and cerebral infarction without residual deficits: Secondary | ICD-10-CM

## 2018-09-09 DIAGNOSIS — E785 Hyperlipidemia, unspecified: Secondary | ICD-10-CM | POA: Diagnosis present

## 2018-09-09 DIAGNOSIS — R7989 Other specified abnormal findings of blood chemistry: Secondary | ICD-10-CM | POA: Diagnosis present

## 2018-09-09 DIAGNOSIS — R109 Unspecified abdominal pain: Secondary | ICD-10-CM

## 2018-09-09 DIAGNOSIS — G9341 Metabolic encephalopathy: Secondary | ICD-10-CM | POA: Diagnosis present

## 2018-09-09 DIAGNOSIS — I10 Essential (primary) hypertension: Secondary | ICD-10-CM | POA: Diagnosis present

## 2018-09-09 DIAGNOSIS — D649 Anemia, unspecified: Secondary | ICD-10-CM | POA: Diagnosis present

## 2018-09-09 DIAGNOSIS — E43 Unspecified severe protein-calorie malnutrition: Secondary | ICD-10-CM | POA: Diagnosis present

## 2018-09-09 DIAGNOSIS — I480 Paroxysmal atrial fibrillation: Secondary | ICD-10-CM | POA: Diagnosis present

## 2018-09-09 DIAGNOSIS — J9811 Atelectasis: Secondary | ICD-10-CM | POA: Diagnosis present

## 2018-09-09 DIAGNOSIS — I69354 Hemiplegia and hemiparesis following cerebral infarction affecting left non-dominant side: Secondary | ICD-10-CM | POA: Diagnosis not present

## 2018-09-09 DIAGNOSIS — Z681 Body mass index (BMI) 19 or less, adult: Secondary | ICD-10-CM | POA: Diagnosis not present

## 2018-09-09 DIAGNOSIS — J918 Pleural effusion in other conditions classified elsewhere: Secondary | ICD-10-CM | POA: Diagnosis present

## 2018-09-09 DIAGNOSIS — R0902 Hypoxemia: Secondary | ICD-10-CM | POA: Diagnosis present

## 2018-09-09 DIAGNOSIS — R627 Adult failure to thrive: Secondary | ICD-10-CM | POA: Diagnosis present

## 2018-09-09 DIAGNOSIS — K5900 Constipation, unspecified: Secondary | ICD-10-CM | POA: Diagnosis present

## 2018-09-09 LAB — CBC
HCT: 27.1 % — ABNORMAL LOW (ref 39.0–52.0)
Hemoglobin: 8.9 g/dL — ABNORMAL LOW (ref 13.0–17.0)
MCH: 23.6 pg — ABNORMAL LOW (ref 26.0–34.0)
MCHC: 32.8 g/dL (ref 30.0–36.0)
MCV: 71.9 fL — ABNORMAL LOW (ref 80.0–100.0)
Platelets: 270 10*3/uL (ref 150–400)
RBC: 3.77 MIL/uL — ABNORMAL LOW (ref 4.22–5.81)
RDW: 20.4 % — ABNORMAL HIGH (ref 11.5–15.5)
WBC: 9.8 10*3/uL (ref 4.0–10.5)
nRBC: 0 % (ref 0.0–0.2)

## 2018-09-09 LAB — BASIC METABOLIC PANEL WITH GFR
Anion gap: 8 (ref 5–15)
BUN: 14 mg/dL (ref 8–23)
CO2: 24 mmol/L (ref 22–32)
Calcium: 7.9 mg/dL — ABNORMAL LOW (ref 8.9–10.3)
Chloride: 99 mmol/L (ref 98–111)
Creatinine, Ser: 0.47 mg/dL — ABNORMAL LOW (ref 0.61–1.24)
GFR calc Af Amer: >60 mL/min
GFR calc non Af Amer: >60 mL/min
Glucose, Bld: 90 mg/dL (ref 70–99)
Potassium: 3.7 mmol/L (ref 3.5–5.1)
Sodium: 131 mmol/L — ABNORMAL LOW (ref 135–145)

## 2018-09-09 LAB — URINALYSIS, ROUTINE W REFLEX MICROSCOPIC
Bilirubin Urine: NEGATIVE
Glucose, UA: NEGATIVE mg/dL
Hgb urine dipstick: NEGATIVE
Ketones, ur: 20 mg/dL — AB
Nitrite: NEGATIVE
Protein, ur: 30 mg/dL — AB
Specific Gravity, Urine: 1.044 — ABNORMAL HIGH (ref 1.005–1.030)
WBC, UA: >50 WBC/hpf — ABNORMAL HIGH (ref 0–5)
pH: 6 (ref 5.0–8.0)

## 2018-09-09 LAB — NOVEL CORONAVIRUS, NAA (HOSP ORDER, SEND-OUT TO REF LAB; TAT 18-24 HRS): SARS-CoV-2, NAA: NOT DETECTED

## 2018-09-09 LAB — BRAIN NATRIURETIC PEPTIDE: B Natriuretic Peptide: 202.1 pg/mL — ABNORMAL HIGH (ref 0.0–100.0)

## 2018-09-09 MED ORDER — METOPROLOL TARTRATE 12.5 MG HALF TABLET
12.5000 mg | ORAL_TABLET | Freq: Two times a day (BID) | ORAL | Status: DC
  Administered 2018-09-09 – 2018-09-11 (×5): 12.5 mg via ORAL
  Filled 2018-09-09 (×5): qty 1

## 2018-09-09 MED ORDER — DIVALPROEX SODIUM 125 MG PO CSDR
125.0000 mg | DELAYED_RELEASE_CAPSULE | Freq: Two times a day (BID) | ORAL | Status: DC
  Administered 2018-09-09 – 2018-09-10 (×2): 125 mg via ORAL
  Filled 2018-09-09 (×2): qty 1

## 2018-09-09 MED ORDER — OXYCODONE HCL 5 MG PO TABS
5.0000 mg | ORAL_TABLET | Freq: Three times a day (TID) | ORAL | Status: DC | PRN
  Administered 2018-09-09 – 2018-09-10 (×4): 5 mg via ORAL
  Filled 2018-09-09 (×4): qty 1

## 2018-09-09 MED ORDER — HYDRALAZINE HCL 20 MG/ML IJ SOLN: 5.0000 mg | INTRAMUSCULAR | Status: DC | PRN

## 2018-09-09 MED ORDER — APIXABAN 5 MG PO TABS
5.0000 mg | ORAL_TABLET | Freq: Two times a day (BID) | ORAL | Status: DC
  Administered 2018-09-09 – 2018-09-10 (×2): 5 mg via ORAL
  Filled 2018-09-09 (×2): qty 1

## 2018-09-09 MED ORDER — ASPIRIN 325 MG PO TABS
325.0000 mg | ORAL_TABLET | Freq: Every day | ORAL | Status: DC
  Administered 2018-09-09 – 2018-09-11 (×3): 325 mg via ORAL
  Filled 2018-09-09 (×4): qty 1

## 2018-09-09 MED ORDER — ENSURE ENLIVE PO LIQD
237.0000 mL | Freq: Two times a day (BID) | ORAL | Status: DC
  Administered 2018-09-10 – 2018-09-11 (×2): 237 mL via ORAL
  Filled 2018-09-09: qty 237

## 2018-09-09 MED ORDER — PNEUMOCOCCAL VAC POLYVALENT 25 MCG/0.5ML IJ INJ
0.5000 mL | INJECTION | INTRAMUSCULAR | Status: AC
  Administered 2018-09-10: 0.5 mL via INTRAMUSCULAR
  Filled 2018-09-09: qty 0.5

## 2018-09-09 NOTE — Evaluation (Signed)
Physical Therapy Evaluation Patient Details Name: Nathan Bruce MRN: [id number] DOB: 02-14-1946 Today's Date: 09/09/2018   History of Present Illness  Pt is a 73 y/o male admitted secondary to hypoxia. Pt with recent CVA that resulted in L sided weakness and recent PNA. MRI revealed Subacute infarction in the left posterior parietal lobe and multiple embolic infarcts. PMH includes a fib, HTN, and CVA with L deficits.   Clinical Impression  Pt admitted secondary to problem above with deficits below. Pt with L extremity weakness at baseline secondary to CVA. Pt also presenting with cognitive deficits and inconsistently following commands, even while daughter was interpreting. Pt requiring total A +2 to perform bed mobility tasks. Per daughter, plan is to return home with 24/7 assist from family. Will continue to follow acutely to maximize functional mobility independence and safety.     Follow Up Recommendations Home health PT;Supervision/Assistance - 24 hour(daughter requesting to take pt home)    Equipment Recommendations  3in1 (PT)    Recommendations for Other Services       Precautions / Restrictions Precautions Precautions: Fall Restrictions Weight Bearing Restrictions: No      Mobility  Bed Mobility Overal bed mobility: Needs Assistance Bed Mobility: Supine to Sit;Sit to Supine;Rolling Rolling: Total assist;+2 for physical assistance   Supine to sit: Total assist;+2 for physical assistance Sit to supine: Total assist;+2 for physical assistance   General bed mobility comments: total A +2 for bed mobility and to sit at EOB. Pt with heavy posterior lean in sitting and required total A to maintain sitting balance. Upon return to supine, rolled from side to side for removal of wet bed pad. Total A +2 to roll.   Transfers                    Ambulation/Gait                Stairs            Wheelchair Mobility    Modified Rankin (Stroke Patients  Only) Modified Rankin (Stroke Patients Only) Pre-Morbid Rankin Score: Severe disability Modified Rankin: Severe disability     Balance Overall balance assessment: Needs assistance Sitting-balance support: Feet supported Sitting balance-Leahy Scale: Zero Sitting balance - Comments: total A to maintain sitting balance                                     Pertinent Vitals/Pain Pain Assessment: Faces Faces Pain Scale: Hurts even more Pain Location: feet and generalized with movement Pain Descriptors / Indicators: Grimacing;Guarding Pain Intervention(s): Limited activity within patient's tolerance;Monitored during session;Repositioned    Home Living Family/patient expects to be discharged to:: Private residence Living Arrangements: Children Available Help at Discharge: Family;Available 24 hours/day Type of Home: House Home Access: Stairs to enter Entrance Stairs-Rails: None Entrance Stairs-Number of Steps: 2 Home Layout: One level Home Equipment: Walker - 2 wheels;Cane - single point;Wheelchair - manual;Hospital bed;Other (comment)(hoyer lift)      Prior Function Level of Independence: Needs assistance   Gait / Transfers Assistance Needed: Per daughter, pt has been mostly bed bound. Pt requires total A +2 to transfer to [facility] and requires total A +2 to be carried down steps.   ADL's / Homemaking Assistance Needed: Per daughter, pt has been requiring sponge baths in the bed. Pt has also been incontinent.         Hand Dominance  Extremity/Trunk Assessment   Upper Extremity Assessment Upper Extremity Assessment: Defer to OT evaluation    Lower Extremity Assessment Lower Extremity Assessment: LLE deficits/detail LLE Deficits / Details: LLE flacid secondary to previous CVA. No voluntary movement noted in LLE.     Cervical / Trunk Assessment Cervical / Trunk Assessment: Kyphotic  Communication   Communication: Prefers language other than  English;Expressive difficulties;Receptive difficulties(daughter interpreted)  Cognition Arousal/Alertness: Awake/alert Behavior During Therapy: Flat affect Overall Cognitive Status: Difficult to assess                                 General Comments: Pt inconsistently following commands given by daughter. Pt would yell out and daughter reports this has been going on since last CVA.       General Comments General comments (skin integrity, edema, etc.): Daughter present at end of session.     Exercises     Assessment/Plan    PT Assessment Patient needs continued PT services  PT Problem List Decreased strength;Decreased activity tolerance;Decreased balance;Decreased knowledge of use of DME;Decreased safety awareness;Decreased cognition;Decreased coordination;Decreased mobility;Pain       PT Treatment Interventions DME instruction;Functional mobility training;Therapeutic activities;Therapeutic exercise;Balance training;Neuromuscular re-education;Cognitive remediation;Patient/family education    PT Goals (Current goals can be found in the Care Plan section)  Acute Rehab PT Goals Patient Stated Goal: for pt to return home per daughter PT Goal Formulation: With family Time For Goal Achievement: 09/23/18 Potential to Achieve Goals: Fair    Frequency Min 2X/week   Barriers to discharge        Co-evaluation PT/OT/SLP Co-Evaluation/Treatment: Yes Reason for Co-Treatment: Complexity of the patient's impairments (multi-system involvement);For patient/therapist safety;Necessary to address cognition/behavior during functional activity;To address functional/ADL transfers PT goals addressed during session: Mobility/safety with mobility;Balance         AM-PAC PT "6 Clicks" Mobility  Outcome Measure Help needed turning from your back to your side while in a flat bed without using bedrails?: Total Help needed moving from lying on your back to sitting on the side of a flat  bed without using bedrails?: Total Help needed moving to and from a bed to a chair (including a wheelchair)?: Total Help needed standing up from a chair using your arms (e.g., wheelchair or bedside chair)?: Total Help needed to walk in hospital room?: Total Help needed climbing 3-5 steps with a railing? : Total 6 Click Score: 6    End of Session   Activity Tolerance: Patient limited by fatigue Patient left: in bed;with call bell/phone within reach;with bed alarm set;with family/visitor present Nurse Communication: Mobility status PT Visit Diagnosis: Other abnormalities of gait and mobility (R26.89);Difficulty in walking, not elsewhere classified (R26.2);Hemiplegia and hemiparesis;Muscle weakness (generalized) (M62.81) Hemiplegia - Right/Left: Left Hemiplegia - caused by: Cerebral infarction    Time: 1050-1110 PT Time Calculation (min) (ACUTE ONLY): 20 min   Charges:   PT Evaluation $PT Eval Moderate Complexity: 1 Mod          Gladys DammeBrittany Orva Gwaltney, PT, DPT  Acute Rehabilitation Services  Pager: [phone] Office: [phone]   Lehman PromBrittany S Zoie Sarin 09/09/2018, 1:17 PM

## 2018-09-09 NOTE — Evaluation (Signed)
Occupational Therapy Evaluation Patient Details Name: Nathan Bruce Y Mccarry MRN: [id number] DOB: May 25, 1945 Today's Date: 09/09/2018    History of Present Illness Pt is a 73 y/o male admitted secondary to hypoxia. Pt with recent CVA that resulted in L sided weakness and recent PNA. MRI revealed Subacute infarction in the left posterior parietal lobe and multiple embolic infarcts. PMH includes a fib, HTN, and CVA with L deficits.    Clinical Impression   Patient admitted for above, limited by problem list below including impaired cognition, generalized weakness (L>R), impaired balance, and decreased activity tolerance.  Per daughter, increased decline in functional status since last hospital admission, requiring total assist for transfers and self care from bed level at home--but he was able to feed himself when encouarged.  Today, patient is unable to follow commands consistently (daughter interpreting) and call outs throughout session.  Requires total assist +2 for bed mobility and self care.  Per daughter, plan is to return home with 24/7 assist from family.  Will follow while admitted in order to decrease burden of care and increase safety upon dc home.     Follow Up Recommendations  Supervision/Assistance - 24 hour;Home health OT    Equipment Recommendations  3 in 1 bedside commode    Recommendations for Other Services       Precautions / Restrictions Precautions Precautions: Fall Restrictions Weight Bearing Restrictions: No      Mobility Bed Mobility Overal bed mobility: Needs Assistance Bed Mobility: Supine to Sit;Sit to Supine;Rolling Rolling: Total assist;+2 for physical assistance   Supine to sit: Total assist;+2 for physical assistance Sit to supine: Total assist;+2 for physical assistance   General bed mobility comments: total A +2 for bed mobility and to sit at EOB. Pt with heavy posterior lean in sitting and required total A to maintain sitting balance. Upon return to supine,  rolled from side to side for removal of wet bed pad. Total A +2 to roll.   Transfers                 General transfer comment: deferred due to safety     Balance Overall balance assessment: Needs assistance Sitting-balance support: Feet supported Sitting balance-Leahy Scale: Zero Sitting balance - Comments: total A to maintain sitting balance Postural control: Posterior lean;Left lateral lean                                 ADL either performed or assessed with clinical judgement   ADL Overall ADL's : Needs assistance/impaired                                       General ADL Comments: total assist for all self care at this time      Vision   Additional Comments: further assessment needed     Perception     Praxis      Pertinent Vitals/Pain Pain Assessment: Faces Faces Pain Scale: Hurts even more Pain Location: feet and generalized with movement Pain Descriptors / Indicators: Grimacing;Guarding Pain Intervention(s): Limited activity within patient's tolerance;Repositioned;Monitored during session     Hand Dominance     Extremity/Trunk Assessment Upper Extremity Assessment Upper Extremity Assessment: RUE deficits/detail;LUE deficits/detail;Difficult to assess due to impaired cognition RUE Deficits / Details: grasp 3/5, generally weak but able to raise UE when initated by therapist   LUE  Deficits / Details: does not grasp to command or attempt to move during session, appears flaccid but daughter reports he is able to move UE a little   Lower Extremity Assessment Lower Extremity Assessment: Defer to PT evaluation LLE Deficits / Details: LLE flacid secondary to previous CVA. No voluntary movement noted in LLE.    Cervical / Trunk Assessment Cervical / Trunk Assessment: Kyphotic   Communication Communication Communication: Prefers language other than English;Expressive difficulties;Receptive difficulties(daughter interpreted)    Cognition Arousal/Alertness: Awake/alert Behavior During Therapy: Flat affect Overall Cognitive Status: Difficult to assess                                 General Comments: Pt inconsistently following commands given by daughter. Pt would yell out and daughter reports this has been going on since last CVA.    General Comments  daughter present throughout session    Exercises     Shoulder Instructions      Home Living Family/patient expects to be discharged to:: Private residence Living Arrangements: Children Available Help at Discharge: Family;Available 24 hours/day Type of Home: House Home Access: Stairs to enter [employer] of Steps: 2 Entrance Stairs-Rails: None Home Layout: One level               Home Equipment: Walker - 2 wheels;Cane - single point;Wheelchair - manual;Hospital bed;Other (comment)(hoyer lift)          Prior Functioning/Environment Level of Independence: Needs assistance  Gait / Transfers Assistance Needed: Per daughter, pt has been mostly bed bound. Pt requires total A +2 to transfer to [facility] and requires total A +2 to be carried down steps.  ADL's / Homemaking Assistance Needed: Per daughter, pt has been requiring sponge baths in the bed. Pt has also been incontinent.             OT Problem List: Decreased strength;Decreased activity tolerance;Impaired balance (sitting and/or standing);Decreased coordination;Decreased cognition;Decreased safety awareness;Decreased knowledge of use of DME or AE;Decreased knowledge of precautions;Pain;Impaired tone;Impaired sensation;Impaired UE functional use      OT Treatment/Interventions: Self-care/ADL training;Neuromuscular education;Therapeutic activities;Cognitive remediation/compensation;Patient/family education;Balance training    OT Goals(Current goals can be found in the care plan section) Acute Rehab OT Goals Patient Stated Goal: for pt to return home per daughter OT Goal  Formulation: With family Time For Goal Achievement: 09/23/18 Potential to Achieve Goals: Good  OT Frequency: Min 2X/week   Barriers to D/C:            Co-evaluation PT/OT/SLP Co-Evaluation/Treatment: Yes Reason for Co-Treatment: Complexity of the patient's impairments (multi-system involvement);For patient/therapist safety;To address functional/ADL transfers;Necessary to address cognition/behavior during functional activity PT goals addressed during session: Mobility/safety with mobility;Balance OT goals addressed during session: ADL's and self-care      AM-PAC OT "6 Clicks" Daily Activity     Outcome Measure Help from another person eating meals?: Total Help from another person taking care of personal grooming?: Total Help from another person toileting, which includes using toliet, bedpan, or urinal?: Total Help from another person bathing (including washing, rinsing, drying)?: Total Help from another person to put on and taking off regular upper body clothing?: Total Help from another person to put on and taking off regular lower body clothing?: Total 6 Click Score: 6   End of Session Nurse Communication: Mobility status  Activity Tolerance: Patient limited by fatigue Patient left: in bed;with call bell/phone within reach;with bed alarm set;with family/visitor present  OT Visit Diagnosis: Other abnormalities of gait and mobility (R26.89);Other symptoms and signs involving cognitive function;Other symptoms and signs involving the nervous system (R29.898)                Time: 1050-1110 OT Time Calculation (min): 20 min Charges:  OT General Charges $OT Visit: 1 Visit OT Evaluation $OT Eval Moderate Complexity: 1 Mod  Chancy Milroy, OT Acute Rehabilitation Services Pager [phone] Office [phone]   Chancy Milroy 09/09/2018, 2:31 PM

## 2018-09-09 NOTE — Progress Notes (Signed)
STROKE TEAM PROGRESS NOTE   INTERVAL HISTORY Pt daughter is at bedside as interpretor. Pt lying in bed, paucity of speech, but intermittent shouting and spitting. Denies pain, but not answer questions or following commands. Left hemiplegia and R leg only able to move ankle and right arm 3+/5.   Vitals:   09/08/18 2257 09/09/18 0632 09/09/18 1146 09/09/18 1235  BP: (!) 141/55 (!) 120/54 117/67 (!) 118/51  Pulse: 75 88 75 76  Resp: Temp:  98.3 F (36.8 C) 98.7 F (37.1 C) (!) 97.3 F (36.3 C)  TempSrc:  Axillary Oral Oral  SpO2: 98% 95% 97% 97%  Weight: 49.9 kg     Height:  (1.676 m)       CBC:  Recent Labs  Lab 09/08/18 1320 09/09/18 0421  WBC 13.7* 9.8  NEUTROABS 12.9*  --   HGB 11.6* 8.9*  HCT 36.3* 27.1*  MCV 73.5* 71.9*  PLT 323 270    Basic Metabolic Panel:  Recent Labs  Lab 09/08/18 1320 09/09/18 0421  NA 132* 131*  K 4.4 3.7  CL 97* 99  CO2 23 24  GLUCOSE 123* 90  BUN 14 14  CREATININE 0.45* 0.47*  CALCIUM 8.5* 7.9*   Lipid Panel:     Component Value Date/Time   CHOL 129 08/18/2018 0453   TRIG 131 08/18/2018 0453   HDL 19 (L) 08/18/2018 0453   CHOLHDL 6.8 08/18/2018 0453   VLDL 26 08/18/2018 0453   LDLCALC 84 08/18/2018 0453   HgbA1c:  Lab Results  Component Value Date   HGBA1C 4.9 08/18/2018   Urine Drug Screen: No results found for: LABOPIA, COCAINSCRNUR, LABBENZ, AMPHETMU, THCU, LABBARB  Alcohol Level No results found for: ETH  IMAGING Ct Head Wo Contrast  Result Date: 09/08/2018 CLINICAL DATA:  Altered level of consciousness. EXAM: CT HEAD WITHOUT CONTRAST TECHNIQUE: Contiguous axial images were obtained from the base of the skull through the vertex without intravenous contrast. COMPARISON:  CT scan of Aug 27, 2018. FINDINGS: Brain: Significantly increased left posterior parietal low density is noted concerning for acute infarction. Stable right occipital infarction is noted consistent with subacute infarction. There does  appear to be some degree of petechial hemorrhage now present within the right occipital infarction. Mild chronic ischemic white matter disease is noted. No midline shift is noted. Ventricular size is within normal limits. Vascular: No hyperdense vessel or unexpected calcification. Skull: Normal. Negative for fracture or focal lesion. Sinuses/Orbits: No acute finding. Other: None. IMPRESSION: Left posterior parietal low density noted on prior exam is significantly enlarged currently consistent with acute infarction. Also noted is interval development of probable petechial hemorrhage involving the cortical gyri of the subacute right occipital infarction noted on prior exam. Critical Value/emergent results were called by telephone at the time of interpretation on 09/08/2018 at 4:41 pm to Dr. Rush Landmark, who verbally acknowledged these results. Electronically Signed   By: Lupita Raider M.D.   On: 09/08/2018 16:42   Mr Brain Wo Contrast  Result Date: 09/08/2018 CLINICAL DATA:  73 y/o  M; delirium and altered mental status. EXAM: MRI HEAD WITHOUT CONTRAST TECHNIQUE: Multiplanar, multiecho pulse sequences of the brain and surrounding structures were obtained without intravenous contrast. COMPARISON:  09/08/2018 CT head.  08/18/2018 MRI head. FINDINGS: Brain: New area of reduced diffusion centered within the left posterior parietal lobe spanning 4.7 x 3.5 x 4.8 cm (volume = 41 cm^3) compatible with acute infarction corresponding to hypodensity on the prior CT  of head. There is susceptibility a vascular structures within the area of acute infarction may represent vessel enlargement and/or thrombus associated with the stroke (series 9, image 57). A component of petechial hemorrhage may be present. Multiple areas of diffusion hyperintensity on the prior MRI of the brain in the right greater than left cerebral hemispheres within the right ACA and PCA distributions as well as multiple left left-sided vascular territories now  demonstrates mildly reduced diffusion on ADC compatible with expected evolution to subacute infarction. The areas of subacute infarction are T2 FLAIR hyperintense with mild local mass effect. Persistent minimal petechial hemorrhage within the right medial parietal lobe subacute infarction. New faint foci of susceptibility hypointensity within the left occipital lobe subacute infarct (series 9, image 55) compatible with interval petechial hemorrhage. There is been interval development of laminar necrosis within right medial frontal and parietal subacute infarctions as well as left parietal and occipital subacute infarctions. Small infarcts within the left cerebellar hemisphere and the right caudate head on the prior MRI no longer demonstrate reduced diffusion compatible with late subacute to chronic etiology. No hydrocephalus, herniation, significant mass effect, or large gross hemorrhage. Vascular: Normal flow voids. Skull and upper cervical spine: Normal marrow signal. Sinuses/Orbits: Negative. Other: Left intra-ocular lens replacement. IMPRESSION: 1. Interval development of an acute infarction centered in the left posterior parietal lobe measuring up to 4.8 cm, 41 cc. Susceptibility blooming of vascular structures within the infarct may represent hyperemia and/or small vessel thrombus related to the stroke. Mild petechial hemorrhage not excluded. 2. Multiple now subacute infarctions in multiple vascular territories are stable in distribution from the prior MRI of the brain. Stable minimal petechial hemorrhage within the right medial parietal lobe infarction. New minimal petechial hemorrhage within the left occipital lobe infarction. 3. Small now late subacute to chronic infarcts in left cerebellar hemisphere and right caudate head no longer have reduced diffusion. These results were called by telephone at the time of interpretation on 09/08/2018 at 10:15 pm to Dr. CHRISTOPHER TEGELER , who verbally acknowledged  these results. Electronically Signed   By: Lance  Furusawa-Stratton M.D.   On: 09/08/2018 22:18   Ct Abdomen Pelvis W Contrast  Result Date: 09/08/2018 CLINICAL DATA:  Low oxygen concentration.  Abdominal pain. EXAM: CT ABDOMEN AND PELVIS WITH CONTRAST TECHNIQUE: Multidetector CT imaging of the abdomen and pelvis was performed using the standard protocol following boluElease HashiNell JElease HashiLoElease HashiHealthsouth Rehabilitation HosElease HashiAElease HashiCalhoElease HashiKearney Amb[facility] Dba HearElease HashiElease HashiDignity Health-St. RoseElease HashiSansum Clinic [facility]ba [facility] CeElease HashiWaElease HashiMargaret R. [phone] HashiBerkElease HashiSelectElease HashiClaiborne Memorial Medical [phone] [phone] rate to large bilateral pleural effusions which are only partially visualized on this exam. There is adjacent atelectasis. Heart is mildly enlarged. Hepatobiliary: No focal liver abnormality is seen. There is mild gallbladder wall thickening. There is no evidence of biliary ductal dilatation. Pancreas: Unremarkable. No pancreatic ductal dilatation or surrounding inflammatory changes. Spleen: Normal in size without focal abnormality. Adrenals/Urinary Tract: The bilateral adrenal glands are unremarkable. The right kidney is unremarkable with no hydronephrosis. There is a 5-6 mm nonobstructing stone in the upper pole of the left kidney. The bladder is unremarkable. Stomach/Bowel: There is a large amount of stool in the rectum. There is no evidence of a small-bowel obstruction. There is some mild gastric wall thickening. Vascular/Lymphatic: Aortic atherosclerosis. No enlarged abdominal or pelvic lymph nodes. 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 Reproductive: Prostate is unremarkable. Other: There is diffuse body wall edema. There is a trace amount of free fluid in the abdomen. Musculoskeletal: There are advanced degenerative  changes throughout the visualized lumbar spine. There is no displaced fracture. IMPRESSION: 1. Moderate to large bilateral pleural effusions. 2. Mild-to-moderate anasarca. 3. Large amount of stool in the rectum. 4. Nonobstructing  stone in the upper pole the left kidney. No hydronephrosis. 5. Mild gallbladder wall thickening which is nonspecific and can be seen in the presence of ascites or heart failure. If there is clinical concern for acute cholecystitis follow-up with ultrasound is recommended. Electronically Signed   By: Katherine Mantle M.D.   On: 09/08/2018 16:41   Dg Chest Port 1 View  Result Date: 09/08/2018 CLINICAL DATA:  Shortness of breath EXAM: PORTABLE CHEST 1 VIEW COMPARISON:  08/17/2018 FINDINGS: Cardiac shadow is mildly prominent but stable. Aortic calcifications are seen. Bilateral pleural effusions are noted as well as left lower lobe consolidation. This is increased from the prior exam. No pneumothorax is seen. No bony abnormality is noted. IMPRESSION: Increasing left basilar infiltrate with bilateral effusions. Electronically Signed   By: Alcide Clever M.D.   On: 09/08/2018 12:14   US Abdomen Limited Ruq  Result Date: 09/08/2018 CLINICAL DATA:  Right upper quadrant abdominal pain with fever. EXAM: ULTRASOUND ABDOMEN LIMITED RIGHT UPPER QUADRANT COMPARISON:  CT from the same day.  Ultrasound dated 05/02 2020 FINDINGS: Gallbladder: There is diffuse gallbladder wall thickening with the gallbladder wall measuring up to approximately 6 mm in width. No gallstones are identified. There is no biliary ductal dilatation. The sonographic Eulah Pont sign is reported as negative. The gallbladder does appear to be somewhat distended. Common bile duct: Diameter: 2.5 mm Liver: No focal lesion identified. Within normal limits in parenchymal echogenicity. Portal vein is patent on color Doppler imaging with normal direction of blood flow towards the liver. A right-sided pleural effusion is again noted. IMPRESSION: 1. Persistent gallbladder wall thickening, however on today's exam no definite gallstones are identified. In the absence of a positive sonographic Murphy sign, gallbladder wall thickening is nonspecific. 2. Right-sided  pleural effusion again noted. Electronically Signed   By: Katherine Mantle M.D.   On: 09/08/2018 19:59    PHYSICAL EXAM  Temp:  [97.3 F (36.3 C)-99.5 F (37.5 C)] 99.5 F (37.5 C) (05/19 1700) Pulse Rate:  [75-90] 84 (05/19 1700) Resp:  [15-18] 17 (05/19 1700) BP: (117-157)/(51-79) 130/58 (05/19 1700) SpO2:  [95 %-100 %] 97 % (05/19 1235) Weight:  [49.9 kg] 49.9 kg (05/18 2257)  General - Well nourished, well developed, intermittent shouting and spitting.  Ophthalmologic - fundi not visualized due to noncooperation.  Cardiovascular - Regular rate and rhythm, not in afib.  Neuro - awake alert, able to tracking bilaterally, no gaze preference, inconsistently blinking to visual threat. Not answer any orientation questions. Not following commands. PERRL, Mild left facial sroop, tongue midline in mouth. LUE and LLE hemiplegia, no movement with painful stimulation. RLE proximal 0/5, but able to move ankle 3/5 dorsiflexion and plantarflexion. No movement of toes. RUE 3+/5 proximal, able to against gravity. DTR 1+ and no babinski. Left sided muscle tone is flaccid. Sensation, coordination and gait not tested.   ASSESSMENT/PLAN [name] is a 73 y.o. male with history of paroxysmal atrial fibrillation on Eliquis, hypertension, hyperlipidemia with recent stroke in April due to A. fib with resultant left hemi-plegia presenting with hypoxia.   Stroke: New L posterior parietal lobe infarct extension from previous stroke, likely due to hypoxia and hypotension from his anemia, recent pneumonia and pleural effusion  CT head L posterior parietal infarct with interval development of petechial hmg  in the cortical gyri of subacute R occipital infarct.   MRI  Interval development L posterior parietal lobe infarct. Subacute infarcts in multiple vascular territories are stable from prior MRI. Stable minimal petechial hmg R medial parietal lobe infarct, new minimal petechial hmg L occipital infarct.  Subacute/chronic infarcts L cerebellar and R caudate head no longer DWI positive.   LDL 84 in April  HgbA1c 4.9 in April  SCDs for VTE prophylaxis  Diet - NPO  Eliquis (apixaban) daily prior to admission, now on aspirin 325 mg daily. Hold off [facility] for now given potential procedures and anemia.   Therapy recommendations:  HH PT  Disposition:  pending   Hx stroke/TIA  07/2018 - Bilateral cerebral and left cerebellar scattered infarcts embolic secondary to confirmed atrial fibrillation.  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  Carotid Doppler and LE venous Doppler unremarkable.  LDL 84 and A1c 4.9.  Started on Eliquis and Lipitor 40.   As per daughter, patient has residual left hemiplegia, bedbound at home, but able to swallow and following commands.  Bilateral pleural effusion with pneumonia  CT abd/pel mod to lg B pleural effusions. Mild to mod anasarca. lg amt stool in rectum. L kidney stone, non-obstructive. Mild GB wall thickening.  CXR increasing left basilar infiltrate  On Levaquin  Paroxysmal atrial Fibrillation  Home anticoagulation:  Eliquis (apixaban) daily   Now on aspirin.  Eliquis on hold for anemia and planned thoracentesis given bilateral pleural effusions.  Anemia   Hemoglobin 10.8-11.6-8.9  EGD has been planned before but not done  On aspirin, currently anticoagulation on hold  Close monitoring   Hyperlipidemia  Home meds: Lipitor 40 resumed in hospital  LDL 49 in April, goal < 70  LFTs nowt elevated 139/95, but stable from 06/2018  Recommend to continue lipitor on discharge if not contraindicated  Other Stroke Risk Factors  Advanced age  Former cigarette smoker, quit 13 years ago   Other Active Problems  Mild hyponatremia  Hospital day # 0  Marvel Plan, MD PhD Stroke Neurology 09/09/2018 6:43 PM   To contact Stroke Continuity provider, please refer to  [email or web address]. After hours, contact General Neurology

## 2018-09-09 NOTE — Evaluation (Addendum)
Clinical/Bedside Swallow Evaluation Patient Details  Name: Nathan Bruce MRN: [id number] Date of Birth: 1945/06/06  Today's Date: 09/09/2018 Time: SLP Start Time (ACUTE ONLY): [id number] SLP Stop Time (ACUTE ONLY): 0853 SLP Time Calculation (min) (ACUTE ONLY): 27 min  Past Medical History:  Past Medical History:  Diagnosis Date  . Arthritis    knees  . Food allergy    Shellfish allergy  . Hypercholesterolemia   . Hypertension   . Stroke [facility](HCC)    Past Surgical History:  Past Surgical History:  Procedure Laterality Date  . APPENDECTOMY    . CATARACT EXTRACTION N/A   . I&D EXTREMITY Left 08/22/2013   Procedure: IRRIGATION AND DEBRIDEMENT EXTREMITY;  Surgeon: Tami RibasKevin R Kuzma, MD;  [facility];  Service: Orthopedics;  Laterality: Left;  left ring finger  . LUMBAR DISC SURGERY  2011   [place]   . PERCUTANEOUS PINNING Left 08/22/2013   Procedure: PERCUTANEOUS PINNING EXTREMITY;  Surgeon: Tami RibasKevin R Kuzma, MD;  [facility];  Service: Orthopedics;  Laterality: Left;  left ring finger  . TOOTH EXTRACTION     HPI:  Nathan Bruce is a 73 y.o. male with medical history significant of paroxysmal atrial fibrillation, hypertension, hyperlipidemia, recent admission on August 18, 2018 for CVA with residual left hemiplegia being brought to the hospital by EMS for evaluation of hypoxia. Per chart he is eating very little which is a significant change from his baseline and  patient points to his abdomen and complains of pain and vomits a small amount of mucus after eating or taking a medication. In addition pt has had a 50 pounds since January. CXR increasing left basilar infiltrate with bilateral effusions. MBS 4/29 with cervical ostephytes impinging on pharynx restricting full transit through UES, flash penetration. Dys 3, thin recommended.    Assessment / Plan / Recommendation Clinical Impression  Unfortunately Nathan Bruce has chronic pharyngoesophageal dysphagia diagnosed via MBS last admission. Depending on  consistency and volume, his laryngeal elevation and anterior hyoid movement is decreased. In addition, significant cervical osteophytes at the upper esophageal sphincter prohibits full clearance of bolus through UES leaving max vallecular and pyriform sinus residue. Suspect "vomiting" is likely pharyngeal/cervical esopahgeal residue he is bringing back into oral cavity although he may also have some underlying lower esophageal impairments not observed or diagnosed with MBS. Today SLP offered puree and thin liquids with immediate throat clearing and multiple swallows in attempts to clear. Thin liquids should clear pharynx easier than solids (pt affirms with dtr at bedside translating). Therapist reviewed and showed videos of MBS explaining typical fair-poor outcomes of this type of dysphagia in pt's with multiple co-morbitities (deconditioning, weight loss etc). Explained services of Palliative care and she was receptive to speaking with them. Clear liquid diet ordered, thin liquids, crush pills, full supervision/assist and esophageal precautions. Will follow for education with daughter.    Order also received for speech-lang-cognitive evaluation. ST familiar with pt. Cognition has declined however acute hallucinations and delirium are likely from current illnesses. Likely will improve from more medical management/time and daughter is here to assist in calming pt. No need for cog eval at this time.   SLP Visit Diagnosis: Dysphagia, pharyngoesophageal phase (R13.14)    Aspiration Risk  Moderate aspiration risk;Severe aspiration risk    Diet Recommendation Thin liquid;Other (Comment)(clear liquids)   Liquid Administration via: Cup Medication Administration: Crushed with puree Supervision: Staff to assist with self feeding;Full supervision/cueing for compensatory strategies Compensations: Slow rate;Small sips/bites;Minimize environmental distractions;Multiple dry swallows  after each bite/sip Postural  Changes: Seated upright at 90 degrees;Remain upright for at least 30 minutes after po intake    Other  Recommendations Oral Care Recommendations: Oral care BID   Follow up Recommendations Skilled Nursing facility      Frequency and Duration min 1 x/week  1 week       Prognosis Prognosis for Safe Diet Advancement: (fair-guarded) Barriers to Reach Goals: Cognitive deficits;Severity of deficits      Swallow Study   General HPI: Nathan Bruce is a 73 y.o. male with medical history significant of paroxysmal atrial fibrillation, hypertension, hyperlipidemia, recent admission on August 18, 2018 for CVA with residual left hemiplegia being brought to the hospital by EMS for evaluation of hypoxia. Per chart he is eating very little which is a significant change from his baseline and  patient points to his abdomen and complains of pain and vomits a small amount of mucus after eating or taking a medication. In addition pt has had a 50 pounds since January. CXR increasing left basilar infiltrate with bilateral effusions. MBS 4/29 with cervical ostephytes impinging on pharynx restricting full transit through UES, flash penetration. Dys 3, thin recommended.  Type of Study: Bedside Swallow Evaluation Previous Swallow Assessment: (see HPI) Diet Prior to this Study: NPO Temperature Spikes Noted: No Respiratory Status: Room air History of Recent Intubation: No Behavior/Cognition: Alert;Distractible;Requires cueing Oral Cavity Assessment: (limited view) Oral Care Completed by SLP: No Oral Cavity - Dentition: Missing dentition Vision: Functional for self-feeding Self-Feeding Abilities: Needs assist Patient Positioning: Upright in bed Baseline Vocal Quality: Normal Volitional Cough: Cognitively unable to elicit Volitional Swallow: Unable to elicit    Oral/Motor/Sensory Function Overall Oral Motor/Sensory Function: Other (comment)   Ice Chips Ice chips: Not tested   Thin Liquid Thin Liquid:  Impaired Presentation: Cup;Straw Oral Phase Impairments: Reduced labial seal Oral Phase Functional Implications: Left anterior spillage Pharyngeal  Phase Impairments: Multiple swallows;Throat Clearing - Immediate;Other (comments);Decreased hyoid-laryngeal movement(audible swallow)    Nectar Thick Nectar Thick Liquid: Not tested   Honey Thick Honey Thick Liquid: Not tested   Puree Puree: Impaired Presentation: Spoon Pharyngeal Phase Impairments: Multiple swallows;Other (comments)(audible )   Solid     Solid: Not tested      Nathan Bruce 09/09/2018,9:20 AM    Nathan Bruce.Ed Nurse, children's [phone] Office [phone]

## 2018-09-09 NOTE — Consult Note (Signed)
Requesting Physician: Dr. Loney Loh    Chief Complaint: Confusion, reduced oxygen saturation, pain  History obtained from: Patient and Chart     HPI:                                                                                                                                       Nathan Bruce is a 73 y.o. male with past medical history of paroxysmal atrial fibrillation on Eliquis, hypertension, hyperlipidemia with recent admission for CVA in April resulting in left hemiplegia presents to the emergency room due to hypoxia. According to the family, patient has remained confused since his discharge from the hospital after his stroke.  ED course Patient underwent chest x-ray which showed chronic atelectasis/pneumonia.  White count was elevated to 13.7.  Patient also complained of abdominal pain and he was noted to have increased LFTs.  CT scan of his abdomen/pelvis was obtained showed possible gallbladder thickening concerning for acute cholecystitis/not present on ultrasound.  CT also showed bilateral large pleural effusions. A CT head of the brain was obtained which showed increased size of left parietal infarct.   Patient was admitted for work-up for hypoxia, pain.  Neurology was consulted regarding CT and MRI findings.  Past Medical History:  Diagnosis Date  . Arthritis    knees  . Food allergy    Shellfish allergy  . Hypercholesterolemia   . Hypertension   . Stroke [facility])     Past Surgical History:  Procedure Laterality Date  . APPENDECTOMY    . CATARACT EXTRACTION N/A   . I&D EXTREMITY Left 08/22/2013   Procedure: IRRIGATION AND DEBRIDEMENT EXTREMITY;  Surgeon: Tami Ribas, MD;  Location: [facility] OR;  Service: Orthopedics;  Laterality: Left;  left ring finger  . LUMBAR DISC SURGERY  2011   in [place]   . PERCUTANEOUS PINNING Left 08/22/2013   Procedure: PERCUTANEOUS PINNING EXTREMITY;  Surgeon: Tami Ribas, MD;  Location: [facility] OR;  Service: Orthopedics;  Laterality: Left;  left ring  finger  . TOOTH EXTRACTION      Family History  Problem Relation Age of Onset  . Hypertension Mother   . Hypertension Father    Social History:  reports that he quit smoking about 13 years ago. His smoking use included cigarettes. He has never used smokeless tobacco. He reports that he does not drink alcohol or use drugs.  Allergies:  Allergies  Allergen Reactions  . Shellfish Allergy Itching and Rash    Mild    Medications:  I reviewed home medications   ROS:                                                                                                                                     Unable to review systems due to patient's mental status.   Examination:                                                                                                      General: Appears well-developed  Psych: Affect appropriate to situation Eyes: No scleral injection HENT: No OP obstrucion Head: Normocephalic.  Cardiovascular: Normal rate and regular rhythm.  Respiratory: Effort normal and breath sounds normal to anterior ascultation GI: Soft.  No distension. There is no tenderness.  Skin: WDI Ext: Bilateral lower extremity edema, worse on the left side with increased temperature.    Neurological Examination (performed with assistance of granddaughter) Mental Status: Patient awake, agitated.  Screaming of pain.  Does not answer name, age or month.  Follow simple commands after repeatedly requesting. Cranial Nerves: II: Visual fields : Difficult to assess, blinks to threat bilaterally III,IV, VI: ptosis not present, tracks examiner across the room, pupils equal, round, reactive to light and accommodation V,VII: smile symmetric, facial light touch sensation normal bilaterally XII: midline tongue extension Motor: Right : Upper extremity   5/5    Left:     Upper  extremity   2+/5  Lower extremity   4/5     Lower extremity   3/5 Tone and bulk:normal tone throughout; no atrophy noted Sensory: Screams with pain when touching both sides. Plantars: Right: downgoing   Left: downgoing Cerebellar: No gross ataxia out of proportion to weakness on exam Gait: Not assessed     Lab Results: Basic Metabolic Panel: Recent Labs  Lab 09/08/18 1320  NA 132*  K 4.4  CL 97*  CO2 23  GLUCOSE 123*  BUN 14  CREATININE 0.45*  CALCIUM 8.5*    CBC: Recent Labs  Lab 09/08/18 1320  WBC 13.7*  NEUTROABS 12.9*  HGB 11.6*  HCT 36.3*  MCV 73.5*  PLT 323    Coagulation Studies: No results for input(s): LABPROT, INR in the last 72 hours.  Imaging: Ct Head Wo Contrast  Result Date: 09/08/2018 CLINICAL DATA:  Altered level of consciousness. EXAM: CT HEAD WITHOUT CONTRAST TECHNIQUE: Contiguous axial images were obtained from the base of the skull through the vertex without intravenous contrast. COMPARISON:  CT scan of Aug 27, 2018. FINDINGS: Brain: Significantly  increased left posterior parietal low density is noted concerning for acute infarction. Stable right occipital infarction is noted consistent with subacute infarction. There does appear to be some degree of petechial hemorrhage now present within the right occipital infarction. Mild chronic ischemic white matter disease is noted. No midline shift is noted. Ventricular size is within normal limits. Vascular: No hyperdense vessel or unexpected calcification. Skull: Normal. Negative for fracture or focal lesion. Sinuses/Orbits: No acute finding. Other: None. IMPRESSION: Left posterior parietal low density noted on prior exam is significantly enlarged currently consistent with acute infarction. Also noted is interval development of probable petechial hemorrhage involving the cortical gyri of the subacute right occipital infarction noted on prior exam. Critical Value/emergent results were called by telephone at the  time of interpretation on 09/08/2018 at 4:41 pm to Dr. Rush Landmark, who verbally acknowledged these results. Electronically Signed   By: Lupita Raider M.D.   On: 09/08/2018 16:42   Mr Brain Wo Contrast  Result Date: 09/08/2018 CLINICAL DATA:  73 y/o  M; delirium and altered mental status. EXAM: MRI HEAD WITHOUT CONTRAST TECHNIQUE: Multiplanar, multiecho pulse sequences of the brain and surrounding structures were obtained without intravenous contrast. COMPARISON:  09/08/2018 CT head.  08/18/2018 MRI head. FINDINGS: Brain: New area of reduced diffusion centered within the left posterior parietal lobe spanning 4.7 x 3.5 x 4.8 cm (volume = 41 cm^3) compatible with acute infarction corresponding to hypodensity on the prior CT of head. There is susceptibility a vascular structures within the area of acute infarction may represent vessel enlargement and/or thrombus associated with the stroke (series 9, image 57). A component of petechial hemorrhage may be present. Multiple areas of diffusion hyperintensity on the prior MRI of the brain in the right greater than left cerebral hemispheres within the right ACA and PCA distributions as well as multiple left left-sided vascular territories now demonstrates mildly reduced diffusion on ADC compatible with expected evolution to subacute infarction. The areas of subacute infarction are T2 FLAIR hyperintense with mild local mass effect. Persistent minimal petechial hemorrhage within the right medial parietal lobe subacute infarction. New faint foci of susceptibility hypointensity within the left occipital lobe subacute infarct (series 9, image 55) compatible with interval petechial hemorrhage. There is been interval development of laminar necrosis within right medial frontal and parietal subacute infarctions as well as left parietal and occipital subacute infarctions. Small infarcts within the left cerebellar hemisphere and the right caudate head on the prior MRI no longer  demonstrate reduced diffusion compatible with late subacute to chronic etiology. No hydrocephalus, herniation, significant mass effect, or large gross hemorrhage. Vascular: Normal flow voids. Skull and upper cervical spine: Normal marrow signal. Sinuses/Orbits: Negative. Other: Left intra-ocular lens replacement. IMPRESSION: 1. Interval development of an acute infarction centered in the left posterior parietal lobe measuring up to 4.8 cm, 41 cc. Susceptibility blooming of vascular structures within the infarct may represent hyperemia and/or small vessel thrombus related to the stroke. Mild petechial hemorrhage not excluded. 2. Multiple now subacute infarctions in multiple vascular territories are stable in distribution from the prior MRI of the brain. Stable minimal petechial hemorrhage within the right medial parietal lobe infarction. New minimal petechial hemorrhage within the left occipital lobe infarction. 3. Small now late subacute to chronic infarcts in left cerebellar hemisphere and right caudate head no longer have reduced diffusion. These results were called by telephone at the time of interpretation on 09/08/2018 at 10:15 pm to Dr. Lynden Oxford , who verbally acknowledged these results. Electronically  Signed   By: Mitzi HansenLance  Furusawa-Stratton M.D.   On: 09/08/2018 22:18   Ct Abdomen Pelvis W Contrast  Result Date: 09/08/2018 CLINICAL DATA:  Low oxygen concentration.  Abdominal pain. EXAM: CT ABDOMEN AND PELVIS WITH CONTRAST TECHNIQUE: Multidetector CT imaging of the abdomen and pelvis was performed using the standard protocol following bolus administration of intravenous contrast. CONTRAST:  100mL OMNIPAQUE IOHEXOL 300 MG/ML  SOLN COMPARISON:  09/29/2014 FINDINGS: Lower chest: There are moderate to large bilateral pleural effusions which are only partially visualized on this exam. There is adjacent atelectasis. Heart is mildly enlarged. Hepatobiliary: No focal liver abnormality is seen. There is mild  gallbladder wall thickening. There is no evidence of biliary ductal dilatation. Pancreas: Unremarkable. No pancreatic ductal dilatation or surrounding inflammatory changes. Spleen: Normal in size without focal abnormality. Adrenals/Urinary Tract: The bilateral adrenal glands are unremarkable. The right kidney is unremarkable with no hydronephrosis. There is a 5-6 mm nonobstructing stone in the upper pole of the left kidney. The bladder is unremarkable. Stomach/Bowel: There is a large amount of stool in the rectum. There is no evidence of a small-bowel obstruction. There is some mild gastric wall thickening. Vascular/Lymphatic: Aortic atherosclerosis. No enlarged abdominal or pelvic lymph nodes. 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 Reproductive: Prostate is unremarkable. Other: There is diffuse body wall edema. There is a trace amount of free fluid in the abdomen. Musculoskeletal: There are advanced degenerative changes throughout the visualized lumbar spine. There is no displaced fracture. IMPRESSION: 1. Moderate to large bilateral pleural effusions. 2. Mild-to-moderate anasarca. 3. Large amount of stool in the rectum. 4. Nonobstructing stone in the upper pole the left kidney. No hydronephrosis. 5. Mild gallbladder wall thickening which is nonspecific and can be seen in the presence of ascites or heart failure. If there is clinical concern for acute cholecystitis follow-up with ultrasound is recommended. Electronically Signed   By: Katherine Mantlehristopher  Green M.D.   On: 09/08/2018 16:41   Dg Chest Port 1 View  Result Date: 09/08/2018 CLINICAL DATA:  Shortness of breath EXAM: PORTABLE CHEST 1 VIEW COMPARISON:  08/17/2018 FINDINGS: Cardiac shadow is mildly prominent but stable. Aortic calcifications are seen. Bilateral pleural effusions are noted as well as left lower lobe consolidation. This is increased from the prior exam. No pneumothorax is seen. No bony abnormality is  noted. IMPRESSION: Increasing left basilar infiltrate with bilateral effusions. Electronically Signed   By: Alcide CleverMark  Lukens M.D.   On: 09/08/2018 12:14   [place] Abdomen Limited Ruq  Result Date: 09/08/2018 CLINICAL DATA:  Right upper quadrant abdominal pain with fever. EXAM: ULTRASOUND ABDOMEN LIMITED RIGHT UPPER QUADRANT COMPARISON:  CT from the same day.  Ultrasound dated 05/02 2020 FINDINGS: Gallbladder: There is diffuse gallbladder wall thickening with the gallbladder wall measuring up to approximately 6 mm in width. No gallstones are identified. There is no biliary ductal dilatation. The sonographic Eulah PontMurphy sign is reported as negative. The gallbladder does appear to be somewhat distended. Common bile duct: Diameter: 2.5 mm Liver: No focal lesion identified. Within normal limits in parenchymal echogenicity. Portal vein is patent on color Doppler imaging with normal direction of blood flow towards the liver. A right-sided pleural effusion is again noted. IMPRESSION: 1. Persistent gallbladder wall thickening, however on today's exam no definite gallstones are identified. In the absence of a positive sonographic Murphy sign, gallbladder wall thickening is nonspecific. 2. Right-sided pleural effusion again noted. Electronically Signed   By: Katherine Mantlehristopher  Green M.D.   On: 09/08/2018  19:59     ASSESSMENT AND PLAN  73 y.o. male with past medical history of paroxysmal atrial fibrillation on Eliquis, hypertension, hyperlipidemia with recent admission for CVA in April resulting in left hemiplegia presents to the emergency room due to hypoxia. CT head repeated due to confusion.  Concern for expansion of old infarct.    Subacute infarction in the left posterior parietal lobe 2/2 completion of prior left parietal stroke vs new embolic event Multiple subacute embolic infarcts  Encephalopathy - metabolic as well as stroke  Risk factors: atrial fibrillation Etiology: cardioembolic   Recommendations # Eliquis held  for procedure tomorrow, continue AA #Continue statin  # BP goal: normotension  # HBAIC and Lipid profile # Telemetry monitoring # Frequent neuro checks #stroke swallow screen  Left leg pain/edema - LE Dopplers to r/o DVT, management per primary  Please page stroke NP  Or  PA  Or MD from 8am -4 pm  as this patient from this time will be  followed by the stroke.   You can look them up on [email or web address]  Password [facility]   Sushanth Aroor [facility] Pager Number [phone]

## 2018-09-09 NOTE — Consult Note (Signed)
Consultation Note Date: 09/09/2018   Patient Name: Nathan Bruce  DOB: [birth year]  MRN: [id number]  Age / Sex: 73 y.o., male  PCP: Rutherford Guys, MD Referring Physician: Terrilee Croak, MD  Reason for Consultation: Establishing goals of care and Psychosocial/spiritual support  HPI/Patient Profile: 73 y.o. male  admitted on 09/08/2018 with past  medical history significant of paroxysmal atrial fibrillation, hypertension, hyperlipidemia, recent admission on August 18, 2018 for CVA with residual left hemiplegia being brought to the hospital by EMS for evaluation of hypoxia.  Per EMS report, patient is nonverbal at baseline.  His wife called EMS because she noticed home oxygen saturation reading in the 60s.  Oxygen saturation checked by EMS 100% on room air.  Patient speaks a dialect of [place], he is Product/process development scientist and served to [employer] during the [place] [employer]  Today the daughter gives a history significant for a 50 pound weight loss over the last several months, continuous confusion and hollering out, dependent for all ADLs.  Daughter verbalizes concern over increasing care needs at home.   ED Course: Afebrile, not tachycardic, not tachypneic, and not hypoxic.  Blood pressure 150/63 on arrival.  White count 13.7.  Hemoglobin 11.6, at baseline.  Platelet count 323. Sodium 132, potassium 4.4, chloride 97, bicarb 23, BUN 14, creatinine 0.4, and glucose 123.  AST 139 and ALT 95.  Alk phos and T bili normal.  AST and ALT previously elevated as well.  Lipase normal.  COVID-19 rapid test negative.  Chest x-ray (personally reviewed) showing increasing left basilar infiltrate with bilateral effusions.  CT abdomen pelvis showing Moderate to large bilateral pleural effusions. 2. Mild-to-moderate anasarca. 3. Large amount of stool in the rectum. 4. Nonobstructing stone in the upper pole the left kidney. No  hydronephrosis. 5. Mild gallbladder wall thickening which is nonspecific and can be seen in the presence of ascites or heart failure. Right upper quadrant ultrasound showing persistent gallbladder wall thickening and no definite gallstone and absence of positive sonographic Murphy sign. CT head showing left posterior parietal low-density which is significantly enlarged from prior exam consistent with acute infarction.  Also showing interval development of probable petechial hemorrhage involving the cortical gyri of the subacute right occipital infarction noted on prior exam.  Family face treatment option decisions, advanced directive decisions and anticipatory care needs.     Clinical Assessment and Goals of Care:   This NP Wadie Lessen reviewed medical records, received report from team, assessed the patient and then met at the bedside with his daughter/ Nathan Bruce to discuss diagnosis, prognosis, GOC, EOL wishes disposition and options.  Concept of  Palliative Care was discussed  A detailed discussion was had today regarding advanced directives.  Concepts specific to code status, artifical feeding and hydration, continued IV antibiotics and rehospitalization was had.  The difference between a aggressive medical intervention path  and a palliative comfort care path for this patient at this time was had.  Values and goals of care important to patient and family were  attempted to be elicited.  MOST form introduced and  Hard Choices booklet was left for review  Natural trajectory and expectations at EOL were discussed.  Questions and concerns addressed.   Family encouraged to call with questions or concerns.    PMT will continue to support holistically.    No documented healthcare power of attorney.  Patient does have a living spouse however daughter listed is main Media planner and family representative.     SUMMARY OF RECOMMENDATIONS    Code Status/Advance Care Planning:  Full  code   Encouraged family to consider DNR/DNI status knowing poor outcomes in similar patients.   Symptom Management:   Agitated behavior: Depakote Sprinkles 125 po bid  Palliative Prophylaxis:   Aspiration, Bowel Regimen, Delirium Protocol, Frequent Pain Assessment and Oral Care  Additional Recommendations (Limitations, Scope, Preferences):  Full Scope Treatment   Family is open to all offered and available medical interventions to prolong life  Psycho-social/Spiritual:   Desire for further Chaplaincy support:no  Additional Recommendations: Grief/Bereavement Support  Prognosis:   Unable to determine  Discharge Planning: To Be Determined      Primary Diagnoses: Present on Admission: . Acute CVA (cerebrovascular accident) ([place]) . Community acquired pneumonia   I have reviewed the medical record, interviewed the patient and family, and examined the patient. The following aspects are pertinent.  Past Medical History:  Diagnosis Date  . Arthritis    knees  . Food allergy    Shellfish allergy  . Hypercholesterolemia   . Hypertension   . Stroke [facility])    Social History   Socioeconomic History  . Marital status: Married    Spouse name: Not on file  . Number of children: Not on file  . Years of education: Not on file  . Highest education level: Not on file  Occupational History  . Not on file  Social Needs  . Financial resource strain: Not on file  . Food insecurity:    Worry: Not on file    Inability: Not on file  . Transportation needs:    Medical: Not on file    Non-medical: Not on file  Tobacco Use  . Smoking status: Former Smoker    Types: Cigarettes    Last attempt to quit: 04/23/2005    Years since quitting: 13.3  . Smokeless tobacco: Never Used  Substance and Sexual Activity  . Alcohol use: No    Alcohol/week: 0.0 standard drinks  . Drug use: No  . Sexual activity: Not on file  Lifestyle  . Physical activity:    Days per week: Not on file     Minutes per session: Not on file  . Stress: Not on file  Relationships  . Social connections:    Talks on phone: Not on file    Gets together: Not on file    Attends religious service: Not on file    Active member of club or organization: Not on file    Attends meetings of clubs or organizations: Not on file    Relationship status: Not on file  Other Topics Concern  . Not on file  Social History Narrative  . Not on file   Family History  Problem Relation Age of Onset  . Hypertension Mother   . Hypertension Father    Scheduled Meds: . aspirin  325 mg Oral Daily  . atorvastatin  40 mg Oral q1800  . metoprolol tartrate  12.5 mg Oral BID  . pantoprazole  40 mg Oral Daily  . [  START ON 09/10/2018] pneumococcal 23 valent vaccine  0.5 mL Intramuscular Tomorrow-1000  . polyethylene glycol  17 g Oral Daily  . vitamin B-12  500 mcg Oral Daily   Continuous Infusions: . sodium chloride 100 mL/hr at 09/08/18 2351  . levofloxacin (LEVAQUIN) IV 750 mg (09/09/18 0005)   PRN Meds:.acetaminophen **OR** acetaminophen (TYLENOL) oral liquid 160 mg/5 mL **OR** acetaminophen, fluticasone, hydrALAZINE, ondansetron (ZOFRAN) IV, oxyCODONE, senna-docusate Medications Prior to Admission:  Prior to Admission medications   Medication Sig Start Date End Date Taking? Authorizing Provider  acetaminophen (TYLENOL) 500 MG tablet Take 500 mg by mouth every 6 (six) hours as needed for mild pain, fever or headache.    Yes [provider]  apixaban (ELIQUIS) 5 MG TABS tablet Take 1 tablet (5 mg total) by mouth 2 (two) times daily. 08/26/18  Yes Marcial Pacas, MD  Ensure (ENSURE) Take 1 Can by mouth 2 (two) times daily between meals.   Yes [provider]  fluticasone (FLONASE) 50 MCG/ACT nasal spray Place 1 spray into both nostrils 2 (two) times daily. Patient taking differently: Place 1 spray into both nostrils as needed for allergies or rhinitis.  07/24/18  Yes Rutherford Guys, MD  guaiFENesin  (MUCINEX) 600 MG 12 hr tablet Take 600 mg by mouth 2 (two) times daily.   Yes [provider]  metoprolol tartrate (LOPRESSOR) 25 MG tablet Take 0.5 tablets (12.5 mg total) by mouth 2 (two) times daily. 08/22/18  Yes Domenic Polite, MD  omeprazole (PRILOSEC) 40 MG capsule Take 1 capsule (40 mg total) by mouth daily. 07/30/18  Yes Armbruster, Carlota Raspberry, MD  oxyCODONE (ROXICODONE) 5 MG immediate release tablet Take 1 tablet (5 mg total) by mouth every 8 (eight) hours as needed for moderate pain or severe pain. 08/29/18  Yes Rutherford Guys, MD  polyethylene glycol powder (GLYCOLAX/MIRALAX) 17 GM/SCOOP powder Take 17 g by mouth daily. Patient taking differently: Take 17 g by mouth as needed for mild constipation or moderate constipation.  09/03/18  Yes Rutherford Guys, MD  predniSONE (DELTASONE) 20 MG tablet Take 1.5 tablets (30 mg total) by mouth daily with breakfast for 7 days, THEN 1 tablet (20 mg total) daily with breakfast for 21 days. 08/29/18 09/26/18 Yes Rutherford Guys, MD  vitamin B-12 (CYANOCOBALAMIN) 500 MCG tablet Take 500 mcg by mouth daily.   Yes [provider]  atorvastatin (LIPITOR) 40 MG tablet Take 1 tablet (40 mg total) by mouth daily at 6 PM. Patient not taking: Reported on 08/23/2018 08/22/18   Domenic Polite, MD   Allergies  Allergen Reactions  . Shellfish Allergy Itching and Rash    Mild   Review of Systems  Unable to perform ROS   Physical Exam  Vital Signs: BP (!) 120/54 (BP Location: Right Arm)   Pulse 88   Temp 98.3 F (36.8 C) (Axillary)   Resp 17   Ht _0  (1.676 m)   Wt 49.9 kg   SpO2 95%   BMI 17.75 kg/m  Pain Scale: 0-10   Pain Score: Asleep   SpO2: SpO2: 95 % O2 Device:SpO2: 95 % O2 Flow Rate: .   IO: Intake/output summary:   Intake/Output Summary (Last 24 hours) at 09/09/2018 1051 Last data filed at 09/09/2018 0620 Gross per 24 hour  Intake 578.93 ml  Output -  Net 578.93 ml    LBM: Last BM Date: 09/08/18 Baseline Weight:  Weight: 49.9 kg Most recent weight: Weight: 49.9 kg  Palliative Assessment/Data:   Discussed with Dr Pietro Cassis  Time In: 1530 Time Out: 1645 Time Total: 75 minutes Greater than 50%  of this time was spent counseling and coordinating care related to the above assessment and plan.  Signed by: Wadie Lessen, NP   Please contact Palliative Medicine Team phone at [phone] for questions and concerns.  For individual provider: See Shea Evans

## 2018-09-09 NOTE — Progress Notes (Signed)
PROGRESS NOTE  [name] UQJ:[id number] DOB: 1946-01-11 DOA: 09/08/2018 PCP: Rutherford Guys, MD   LOS: 0 days   Brief narrative: Nathan Bruce is a 73 y.o. male with medical history significant of paroxysmal atrial fibrillation, hypertension, hyperlipidemia, recent admission on August 18, 2018 for CVA with residual left hemiplegia being brought to the hospital by EMS for evaluation of hypoxia.  Per EMS report, patient is nonverbal at baseline.  His wife called EMS because she noticed home oxygen saturation reading in the 60s.  Oxygen saturation checked by EMS 100% on room air.  ED Course: Afebrile, not tachycardic, not tachypneic, and not hypoxic.  Blood pressure 150/63 on arrival. White count 13.7. COVID-19 rapid test negative.   Chest x-ray showed increasing left basilar infiltrate with bilateral effusions.   CT abdomen pelvis showed moderate to large bilateral pleural effusions. Right upper quadrant ultrasound showing persistent gallbladder wall thickening and no definite gallstone and absence of positive sonographic Murphy sign. CT head and MRI showed left posterior parietal low-density which is significantly enlarged from prior exam consistent with acute infarction.  Also showing interval development of probable petechial hemorrhage involving the cortical gyri of the subacute right occipital infarction noted on prior exam.  Patient was admitted under hospitalist service for further management.  Subjective: Patient was seen and examined this afternoon.  Elderly [place] male.  Does not speak [place].  Daughter/primary caregiver at bedside to interpret.  According to daughter.  Since the stroke, patient has had cognitive deficits as well.  He seems to be talking to his deceased parents.  He tends to choke on his food.  Does not have a PEG tube.  Has significant weight loss.  Assessment/Plan:  Principal Problem:   Acute CVA (cerebrovascular accident) [facility]) Active Problems:   Community  acquired pneumonia   Pleural effusion, bilateral   Hyponatremia   Abdominal pain  Community-acquired pneumonia -Not hypoxic.  No fever.  WBC count 13.7.  Chest x-ray showed increasing left basilar infiltrate.  Started on IV Levaquin on admission which we will continue.  Monitor CBC daily.  Currently not requiring supplemental oxygen.  Moderate to large bilateral pleural effusions  -Patient currently is not short of breath or complaining of chest pain.  Family would like to go for [facility] care.  Will discuss with them.  If family opts, we can order for ultrasound-guided thoracentesis.    Stroke  -Imaging of head with findings as above.  Neurology consult was obtained.  Per neurology note, patient most likely has subacute infarction in the left posterior parietal lobe due to completion of prior left parietal stroke vs new embolic event.  There are multiple other subacute embolic infarcts as well. -On Eliquis for anticoagulation.  Continue statin.  Dysphagia -Patient has significant dysphagia since last stroke.  He also has osteophytes pushing on the pharynx.  Discussed with patient's daughter this afternoon.  She would not like to go for PEG tube feeding.  She is heading towards palliative care route. -Okay for luxury feeding.    Mild hyponatremia Sodium 131 today. -IV fluid hydration -Continue to monitor BMP  Abdominal pain -Inconsistent in examination.  Patient does not seem to have any abdominal tenderness at the time of my evaluation.  CT scan of abdomen showed large amount of stool.  No clear evidence of acute cholecystitis.  Patient is not a surgical candidate at this time anyway.  Not proceed with any surgical intervention at this time.   MiraLAX daily for constipation.  Paroxysmal atrial fibrillation Patient noted to be in A. fib during his recent hospitalization.  Currently in sinus rhythm.  Continue metoprolol for rate control.  Resume Eliquis.  Weight loss/elevated  LFTs/concern for connective tissue disease Per prior labs, ANA negative, CCP positive (weak positive at 22), ESR 56.  Seen by GI on July 30, 2018 and EGD was recommended but not done at that time given COVID-19 outbreak and the practice not doing elective procedures.  Patient was referred to rheumatology at the time of discharge as he was also noted to have muscle tenderness and a skin rash. Per daughter, he was recently seen by rheumatology and they have not heard back about the lab results yet.  -Continue PPI for possible gastritis or peptic ulcer disease as previously recommended by GI -Ensure outpatient GI and rheumatology follow-up  Hypertension -Blood pressure goal: normotension per neurology -Continue home metoprolol -Hydralazine PRN  Mobility: Bedbound. Diet: luxury feeding DVT prophylaxis:  Eliquis Code Status:   Code Status: Full Code  Family Communication:  Discussed with daughter Opal Sidles at bedside Disposition Plan:  Home likely in 1 to 2 days  Consultants:  Palliative care  Procedures:    Antimicrobials:  Anti-infectives (From admission, onward)   Start     Dose/Rate Route Frequency Ordered Stop   09/08/18 2300  levofloxacin (LEVAQUIN) IVPB 750 mg     750 mg 100 mL/hr over 90 Minutes Intravenous Every 24 hours 09/08/18 2248        Infusions:  . sodium chloride 100 mL/hr at 09/09/18 1135  . levofloxacin (LEVAQUIN) IV 750 mg (09/09/18 0005)    Scheduled Meds: . aspirin  325 mg Oral Daily  . atorvastatin  40 mg Oral q1800  . metoprolol tartrate  12.5 mg Oral BID  . pantoprazole  40 mg Oral Daily  . [START ON 09/10/2018] pneumococcal 23 valent vaccine  0.5 mL Intramuscular Tomorrow-1000  . polyethylene glycol  17 g Oral Daily  . vitamin B-12  500 mcg Oral Daily    PRN meds: acetaminophen **OR** acetaminophen (TYLENOL) oral liquid 160 mg/5 mL **OR** acetaminophen, fluticasone, hydrALAZINE, ondansetron (ZOFRAN) IV, oxyCODONE, senna-docusate   Objective:  Vitals:   09/09/18 1146 09/09/18 1235  BP: 117/67 (!) 118/51  Pulse: 75 76  Resp: 18 18  Temp: 98.7 F (37.1 C) (!) 97.3 F (36.3 C)  SpO2: 97% 97%    Intake/Output Summary (Last 24 hours) at 09/09/2018 1520 Last data filed at 09/09/2018 9892 Gross per 24 hour  Intake 578.93 ml  Output -  Net 578.93 ml   Filed Weights   09/08/18 2257  Weight: 49.9 kg   Weight change:  Body mass index is 17.75 kg/m.   Physical Exam: General exam: Elderly [place] male.  Alert, awake, answers few questions to daughter in [place]. Skin: No rashes, lesions or ulcers. HEENT: Atraumatic, normocephalic, supple neck, no obvious bleeding Lungs: Clear to auscultation bilaterally CVS: Regular rate and rhythm, no murmur GI/Abd soft, nontender, nondistended, bowel sound present CNS: Alert, awake, answers simple questions.  Not oriented to place or time.  At baseline, post stroke deficits on the left Psychiatry: Depressed look Extremities: No pedal edema, no calf tenderness  Data Review: I have personally reviewed the laboratory data and studies available.  Recent Labs  Lab 09/08/18 1320 09/09/18 0421  WBC 13.7* 9.8  NEUTROABS 12.9*  --   HGB 11.6* 8.9*  HCT 36.3* 27.1*  MCV 73.5* 71.9*  PLT 323 270   Recent Labs  Lab 09/08/18 1320  09/09/18 0421  NA 132* 131*  K 4.4 3.7  CL 97* 99  CO2 23 24  GLUCOSE 123* 90  BUN 14 14  CREATININE 0.45* 0.47*  CALCIUM 8.5* 7.9*    Terrilee Croak, MD  [facility] 09/09/2018

## 2018-09-10 ENCOUNTER — Inpatient Hospital Stay (HOSPITAL_COMMUNITY): Payer: Medicare Other

## 2018-09-10 DIAGNOSIS — F0151 Vascular dementia with behavioral disturbance: Secondary | ICD-10-CM

## 2018-09-10 DIAGNOSIS — R451 Restlessness and agitation: Secondary | ICD-10-CM

## 2018-09-10 DIAGNOSIS — G9340 Encephalopathy, unspecified: Secondary | ICD-10-CM

## 2018-09-10 DIAGNOSIS — R627 Adult failure to thrive: Secondary | ICD-10-CM

## 2018-09-10 DIAGNOSIS — Z515 Encounter for palliative care: Secondary | ICD-10-CM

## 2018-09-10 MED ORDER — LIDOCAINE HCL 1 % IJ SOLN
INTRAMUSCULAR | Status: AC
  Filled 2018-09-10: qty 20

## 2018-09-10 MED ORDER — DIVALPROEX SODIUM 125 MG PO CSDR
250.0000 mg | DELAYED_RELEASE_CAPSULE | Freq: Two times a day (BID) | ORAL | Status: DC
  Administered 2018-09-10 – 2018-09-11 (×2): 250 mg via ORAL
  Filled 2018-09-10 (×2): qty 2

## 2018-09-10 MED ORDER — LIDOCAINE HCL (PF) 1 % IJ SOLN
INTRAMUSCULAR | Status: AC | PRN
  Administered 2018-09-10: 10 mL

## 2018-09-10 NOTE — Progress Notes (Signed)
Patient brought to radiology department for thoracentesis due to bilateral pleural effusions. Patient in mittens, somewhat confused.  Daughter was presents for assistance with calm and interpretation on patient's behalf due to confusion and dialect.  Attempted to roll, position, and inject with lidocaine x4.  With each attempt patient would thrash in bed.  Despite education, redirection, and reattempts I was not able to proceed safely with procedure.  Procedure stopped.  MD made aware.   Loyce Dys, MS RD PA-C 4:54 PM

## 2018-09-10 NOTE — Progress Notes (Signed)
   09/10/18 1525  Clinical Encounter Type  Visited With Patient and family together  Visit Type Initial  Referral From Physician (AD)  Consult/Referral To Chaplain  This chaplain responded to spiritual care consult for an AD.  The chaplain read the Pt. chart and checked in with the RN-Lauryn.  The chaplain introduced herself to the Pt. daughter-Jen. The chaplain started AD education with the Pt. daughter in the room and later clarified a Pt. AD was not needed.  Jen informed the chaplain of the PMT relationship with NP-ML. This chaplain will be available for F/U as needed with the Pt. and family.

## 2018-09-10 NOTE — Progress Notes (Signed)
Initial Nutrition Assessment   RD working remotely.  DOCUMENTATION CODES:   Underweight  INTERVENTION:   Continue Ensure Enlive po BID, each supplement provides 350 kcal and 20 grams of protein.  NUTRITION DIAGNOSIS:   Inadequate oral intake related to dysphagia as evidenced by meal completion < 25%.  GOAL:   Patient will meet greater than or equal to 90% of their needs  MONITOR:   PO intake, Supplement acceptance, Weight trends, Labs, Skin, I & O's  REASON FOR ASSESSMENT:   Malnutrition Screening Tool    ASSESSMENT:   73 y.o. male with medical history significant of paroxysmal atrial fibrillation, hypertension, hyperlipidemia, recent admission on August 18, 2018 for CVA with residual left hemiplegia being brought to the hospital presents for evaluation of hypoxia. Chest x-ray showed increasing left basilar infiltrate with bilateral effusions.  CT head and MRI showed left posterior parietal low-density consistent with acute infarction   Pt MD note, pt nonverbal at baseline. RD unable to obtain most recent nutrition history. Per SLP evaluation, pt with chronic pharyngoesophageal dysphagia. Pt with moderate to severe aspiration risk. Pt with frequent choking/vomiting episode at home related to dysphagia. Pt currently on a thin clear liquid diet as thin liquids clear pharynx easier than solids. Family desires no PEG tube feeding. Family agreeable to luxury po feeding for patient. Palliative has been consulted. Daughter leaning towards comfort path. Goals of care discussion ongoing with family. Pt with a 12% weight loss in 3 months, significant for time frame. Pt currently has Ensure ordered. RD to continue with current orders to aid in po intake.   Unable to complete Nutrition-Focused physical exam at this time.   Labs and medications reviewed.   Diet Order:   Diet Order            Diet clear liquid Room service appropriate? Yes; Fluid consistency: Thin  Diet effective now               EDUCATION NEEDS:   Not appropriate for education at this time  Skin:  Skin Assessment: Reviewed RN Assessment  Last BM:  5/18  Height:   Ht Readings from Last 1 Encounters:  09/08/18 5\' 6"  (1.676 m)    Weight:   Wt Readings from Last 1 Encounters:  09/08/18 49.9 kg    Ideal Body Weight:  64.5 kg  BMI:  Body mass index is 17.75 kg/m.  Estimated Nutritional Needs:   Kcal:  1700-1850  Protein:  75-85 grams  Fluid:  1.7 - 1.9 L/day    Roslyn Smiling, MS, RD, LDN Pager # [phone] After hours/ weekend pager # [phone]

## 2018-09-10 NOTE — Progress Notes (Signed)
Patient ID: BLU HEWATT, male   DOB: 1946/01/17, 73 y.o.   MRN: [id number]  This NP visited patient at the bedside as a follow up to  yesterday's GOCs meeting.  Daughter at bedside.  Continued conversation regarding current medical situation, goals of care and anticipatory care needs.  We revisited advanced directive concept specific to CODE STATUS, artificial feeding and hydration, IV antibiotic use and rehospitalization.  We discussed hospice benefit at home.  Daughter again verbalizes her concern regarding the impact of culture on family decisions for this patient.  Fortunately we were able to have the patient's pastor who speaks his native language come to the bedside.  [name]  # [phone]  The pastor was able to support the daughter regarding decisions that reflect a comfort approach;  DNR/DNI, artificial feeding and hydration.  He spoke to human mortality and that end-of-life is part of God's plan as much of his life.  He spoke to the patient's integrity and trustworthiness.  Daughter/[name] was much comforted by the pastors presence and encouragement.  Daughter is leaning towards a more comfort path and this afternoon both she and the pastor will speak to the rest of the family members regarding goals of care and treatment plan.  This nurse practitioner will re-meet with the daughter tomorrow morning to complete a MOST form.  Questions and concerns addressed   Discussed with Dr Pola Corn and speech therapy.  Total time spent on the unit was 45 minutes  Greater than 50% of the time was spent in counseling and coordination of care  Lorinda Creed NP  Palliative Medicine Team Team Phone # [phone] Pager [phone]

## 2018-09-10 NOTE — Progress Notes (Signed)
  Speech Language Pathology Treatment: Dysphagia  Patient Details Name: JERMIAH SODERMAN MRN: [id number] DOB: 03/24/1946 Today's Date: 09/10/2018 Time: 3406-8403 SLP Time Calculation (min) (ACUTE ONLY): 13 min  Assessment / Plan / Recommendation Clinical Impression  SLP discussed pt's dysphagia with hospitalist MD and Stanton Kidney, NP with Palliative care. Mary met with pt, daughter and pt's chaplain and agreed upon no long term alternate means of nutrition. He has chronic dysphagia and most appropriate easiest consistency to transit through pharynx is liquids. Daughter waiting on RN to give Ensure "because I'm scared." SLP verbally and physically demonstrated swallow precautions for upright posture, small sips (cup or straw), allow time for multiple swallows, remain upright as long as able with back/sacral pain. Today he swallowed 3 times with sips Ensure for 3 trials and requested suction which extracted Ensure residue. Liquid stimulated increased saliva production which he wants suctioned frequently. Daughter asked him to swallow but he wants to be suctioned or he expectorates (has waterproof pad on lap to catch po's). ST will continue to follow and educate of textures/foods for when he is discharged home.    HPI HPI: Deuntae DEYVI BONANNO is a 73 y.o. male with medical history significant of paroxysmal atrial fibrillation, hypertension, hyperlipidemia, recent admission on August 18, 2018 for CVA with residual left hemiplegia being brought to the hospital by EMS for evaluation of hypoxia. Per chart he is eating very little which is a significant change from his baseline and  patient points to his abdomen and complains of pain and vomits a small amount of mucus after eating or taking a medication. In addition pt has had a 50 pounds since January. CXR increasing left basilar infiltrate with bilateral effusions. MBS 4/29 with cervical ostephytes impinging on pharynx restricting full transit through UES, flash penetration. Dys 3,  thin recommended.       SLP Plan  Continue with current plan of care       Recommendations  Diet recommendations: Thin liquid;Other(comment)(thin liquids) Liquids provided via: Cup;Straw Medication Administration: Crushed with puree Supervision: Staff to assist with self feeding;Full supervision/cueing for compensatory strategies Compensations: Slow rate;Small sips/bites;Minimize environmental distractions;Multiple dry swallows after each bite/sip Postural Changes and/or Swallow Maneuvers: Seated upright 90 degrees                Oral Care Recommendations: Oral care BID Follow up Recommendations: 24 hour supervision/assistance Plan: Continue with current plan of care       [place], Ruta Capece Willis 09/10/2018, 3:51 PM  Orbie Pyo Theadora Noyes M.Ed Risk analyst [phone] Office [phone]

## 2018-09-10 NOTE — Progress Notes (Signed)
STROKE TEAM PROGRESS NOTE   INTERVAL HISTORY Pt daughter is at bedside as interpretor. Pt lying in bed, sleeping. Discussed with daughter about palliative care, daughter stated that pt told her that he does not want to die and he is a IT sales professionalfighter. Family is interested nursing home. I also discussed with Dr. Leane Plattaha that DOAC in on hold for thoracentesis, and if pt goes to NH, PEG is reasonable to guarantee the nutrition. Family is willing to consider.    Vitals:   09/10/18 0329 09/10/18 0825 09/10/18 1201 09/10/18 1700  BP: (!) 135/59  138/61   Pulse: 75 79  76  Resp: 17 19 18 16   Temp: 97.8 F (36.6 C) 97.9 F (36.6 C) 98.2 F (36.8 C) 98.1 F (36.7 C)  TempSrc: Oral Axillary Axillary Axillary  SpO2: 98%  99%   Weight:      Height:        CBC:  Recent Labs  Lab 09/08/18 1320 09/09/18 0421  WBC 13.7* 9.8  NEUTROABS 12.9*  --   HGB 11.6* 8.9*  HCT 36.3* 27.1*  MCV 73.5* 71.9*  PLT 323 270    Basic Metabolic Panel:  Recent Labs  Lab 09/08/18 1320 09/09/18 0421  NA 132* 131*  K 4.4 3.7  CL 97* 99  CO2 23 24  GLUCOSE 123* 90  BUN 14 14  CREATININE 0.45* 0.47*  CALCIUM 8.5* 7.9*   Lipid Panel:     Component Value Date/Time   CHOL 129 08/18/2018 0453   TRIG 131 08/18/2018 0453   HDL 19 (L) 08/18/2018 0453   CHOLHDL 6.8 08/18/2018 0453   VLDL 26 08/18/2018 0453   LDLCALC 84 08/18/2018 0453   HgbA1c:  Lab Results  Component Value Date   HGBA1C 4.9 08/18/2018   Urine Drug Screen: No results found for: LABOPIA, COCAINSCRNUR, LABBENZ, AMPHETMU, THCU, LABBARB  Alcohol Level No results found for: [facility]  IMAGING Mr Brain Wo Contrast  Result Date: 09/08/2018 CLINICAL DATA:  73 y/o  M; delirium and altered mental status. EXAM: MRI HEAD WITHOUT CONTRAST TECHNIQUE: Multiplanar, multiecho pulse sequences of the brain and surrounding structures were obtained without intravenous contrast. COMPARISON:  09/08/2018 CT head.  08/18/2018 MRI head. FINDINGS: Brain: New area of  reduced diffusion centered within the left posterior parietal lobe spanning 4.7 x 3.5 x 4.8 cm (volume = 41 cm^3) compatible with acute infarction corresponding to hypodensity on the prior CT of head. There is susceptibility a vascular structures within the area of acute infarction may represent vessel enlargement and/or thrombus associated with the stroke (series 9, image 57). A component of petechial hemorrhage may be present. Multiple areas of diffusion hyperintensity on the prior MRI of the brain in the right greater than left cerebral hemispheres within the right ACA and PCA distributions as well as multiple left left-sided vascular territories now demonstrates mildly reduced diffusion on ADC compatible with expected evolution to subacute infarction. The areas of subacute infarction are T2 FLAIR hyperintense with mild local mass effect. Persistent minimal petechial hemorrhage within the right medial parietal lobe subacute infarction. New faint foci of susceptibility hypointensity within the left occipital lobe subacute infarct (series 9, image 55) compatible with interval petechial hemorrhage. There is been interval development of laminar necrosis within right medial frontal and parietal subacute infarctions as well as left parietal and occipital subacute infarctions. Small infarcts within the left cerebellar hemisphere and the right caudate head on the prior MRI no longer demonstrate reduced diffusion compatible with late subacute to  chronic etiology. No hydrocephalus, herniation, significant mass effect, or large gross hemorrhage. Vascular: Normal flow voids. Skull and upper cervical spine: Normal marrow signal. Sinuses/Orbits: Negative. Other: Left intra-ocular lens replacement. IMPRESSION: 1. Interval development of an acute infarction centered in the left posterior parietal lobe measuring up to 4.8 cm, 41 cc. Susceptibility blooming of vascular structures within the infarct may represent hyperemia and/or  small vessel thrombus related to the stroke. Mild petechial hemorrhage not excluded. 2. Multiple now subacute infarctions in multiple vascular territories are stable in distribution from the prior MRI of the brain. Stable minimal petechial hemorrhage within the right medial parietal lobe infarction. New minimal petechial hemorrhage within the left occipital lobe infarction. 3. Small now late subacute to chronic infarcts in left cerebellar hemisphere and right caudate head no longer have reduced diffusion. These results were called by telephone at the time of interpretation on 09/08/2018 at 10:15 pm to Dr. Lynden Oxford , who verbally acknowledged these results. Electronically Signed   By: Mitzi Hansen M.D.   On: 09/08/2018 22:18   Ir US Chest  Result Date: 09/10/2018 CLINICAL DATA:  Pleural effusions and scheduled for thoracentesis. EXAM: CHEST ULTRASOUND COMPARISON:  CT of the abdomen 09/08/2018 FINDINGS: Ultrasound images demonstrated a right pleural effusion. Unfortunately, the patient is confused and would not tolerate a thoracentesis procedure. IMPRESSION: Right pleural effusion. Patient was unable to tolerate a thoracentesis procedure. Electronically Signed   By: Richarda Overlie M.D.   On: 09/10/2018 17:12   US Abdomen Limited Ruq  Result Date: 09/08/2018 CLINICAL DATA:  Right upper quadrant abdominal pain with fever. EXAM: ULTRASOUND ABDOMEN LIMITED RIGHT UPPER QUADRANT COMPARISON:  CT from the same day.  Ultrasound dated 05/02 2020 FINDINGS: Gallbladder: There is diffuse gallbladder wall thickening with the gallbladder wall measuring up to approximately 6 mm in width. No gallstones are identified. There is no biliary ductal dilatation. The sonographic Eulah Pont sign is reported as negative. The gallbladder does appear to be somewhat distended. Common bile duct: Diameter: 2.5 mm Liver: No focal lesion identified. Within normal limits in parenchymal echogenicity. Portal vein is patent on color  Doppler imaging with normal direction of blood flow towards the liver. A right-sided pleural effusion is again noted. IMPRESSION: 1. Persistent gallbladder wall thickening, however on today's exam no definite gallstones are identified. In the absence of a positive sonographic Murphy sign, gallbladder wall thickening is nonspecific. 2. Right-sided pleural effusion again noted. Electronically Signed   By: Katherine Mantle M.D.   On: 09/08/2018 19:59    PHYSICAL EXAM  Temp:  [97.6 F (36.4 C)-98.2 F (36.8 C)] 98.1 F (36.7 C) (05/20 1700) Pulse Rate:  [72-79] 76 (05/20 1700) Resp:  [16-19] 16 (05/20 1700) BP: (116-138)/(51-61) 138/61 (05/20 1201) SpO2:  [96 %-99 %] 99 % (05/20 1201)  General - Well nourished, well developed, intermittent shouting and spitting.  Ophthalmologic - fundi not visualized due to noncooperation.  Cardiovascular - Regular rate and rhythm, not in afib.  Neuro - sleepy but open eyes on voice, continue to shout and spitting intermittently, able to tracking bilaterally, no gaze preference, inconsistently blinking to visual threat. Not answer any orientation questions. Not following commands. PERRL, Mild left facial sroop, tongue midline in mouth. LUE and LLE hemiplegia, no movement with painful stimulation. RLE proximal 0/5, but able to move ankle 3/5 dorsiflexion and plantarflexion. No movement of toes. RUE 3+/5 proximal, able to against gravity. DTR 1+ and no babinski. Left sided muscle tone is flaccid. Sensation, coordination and gait not  tested.   ASSESSMENT/PLAN [name] is a 73 y.o. male with history of paroxysmal atrial fibrillation on Eliquis, hypertension, hyperlipidemia with recent stroke in April due to A. fib with resultant left hemi-plegia presenting with hypoxia.   Stroke: New L posterior parietal lobe infarct extension from previous stroke, likely due to hypoxia and hypotension from his anemia, recent pneumonia and pleural effusion  CT head L  posterior parietal infarct with interval development of petechial hmg in the cortical gyri of subacute R occipital infarct.   MRI  Interval development L posterior parietal lobe infarct. Subacute infarcts in multiple vascular territories are stable from prior MRI. Stable minimal petechial hmg R medial parietal lobe infarct, new minimal petechial hmg L occipital infarct. Subacute/chronic infarcts L cerebellar and R caudate head no longer DWI positive.   LDL 84 in April  HgbA1c 4.9 in April  SCDs for VTE prophylaxis  Diet - NPO  Eliquis (apixaban) daily prior to admission, now on aspirin 325 mg daily. Hold off AC for now given thoracentesis and anemia.   Therapy recommendations:  HH PT  Disposition:  pending   Hx stroke/TIA  07/2018 - Bilateral cerebral and left cerebellar scattered infarcts embolic secondary to confirmed atrial fibrillation.  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  Carotid Doppler and LE venous Doppler unremarkable.  LDL 84 and A1c 4.9.  Started on Eliquis and Lipitor 40.   As per daughter, patient has residual left hemiplegia, bedbound at home, but able to swallow and following commands.  Bilateral pleural effusion with pneumonia  CT abd/pel mod to lg B pleural effusions. Mild to mod anasarca. lg amt stool in rectum. L kidney stone, non-obstructive. Mild GB wall thickening.  CXR increasing left basilar infiltrate  On Levaquin  Paroxysmal atrial Fibrillation  Home anticoagulation:  Eliquis (apixaban) daily   Now on aspirin.  Eliquis on hold for anemia and planned thoracentesis given bilateral pleural effusions.  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   Anemia   Hemoglobin 10.8-11.6-8.9  EGD has been planned  before but not done  On aspirin, currently anticoagulation on hold  Close monitoring   Hyperlipidemia  Home meds: Lipitor 40 resumed in hospital  LDL 49 in April, goal < 70  LFTs nowt elevated 139/95, but stable from 06/2018  Recommend to continue lipitor on discharge if not contraindicated  Other Stroke Risk Factors  Advanced age  Former cigarette smoker, quit 13 years ago   Other Active Problems  Mild hyponatremia  Hospital day # 1  Neurology will sign off. Please call with questions. Pt will follow up with stroke clinic NP at [facility] in about 4 weeks. Thanks for the consult.   Marvel Plan, MD PhD Stroke Neurology 09/10/2018 5:28 PM   To contact Stroke Continuity provider, please refer to [email or web address]. After hours, contact General Neurology

## 2018-09-10 NOTE — TOC Initial Note (Signed)
Transition of Care [facility]) - Initial/Assessment Note    Patient Details  Name: Nathan Bruce MRN: [id number] Date of Birth: 10/02/1945  [facility]) CM/SW Contact:    Kermit Balo, RN Phone Number: 09/10/2018, 2:29 PM  Clinical Narrative:                 Pt recently admitted with a stroke.  Pt set up last visit with [facility] through [facility]. Pt also had hoyer lift and hospital bed delivered to his home. Pt has care at home with his family.  CM following.  Expected Discharge Plan: Home w Home Health Services Barriers to Discharge: Continued Medical Work up   Patient Goals and CMS Choice        Expected Discharge Plan and Services Expected Discharge Plan: Home w Home Health Services   Discharge Planning Services: CM Consult   Living arrangements for the past 2 months: Single Family Home                                      Prior Living Arrangements/Services Living arrangements for the past 2 months: Single Family Home Lives with:: Spouse, Adult Children(daughter is main contact) Patient language and need for interpreter reviewed:: Yes(pt speaks [place])        Need for Family Participation in Patient Care: Yes (Comment)(24 hour supervision) Care giver support system in place?: Yes (comment)(family able to provide needed care) Current home services: DME(hoyer lift and hospital bed) Criminal Activity/Legal Involvement Pertinent to Current Situation/Hospitalization: No - Comment as needed  Activities of Daily Living Home Assistive Devices/Equipment: Wheelchair ADL Screening (condition at time of admission) Patient's cognitive ability adequate to safely complete daily activities?: No Is the patient deaf or have difficulty hearing?: No Does the patient have difficulty seeing, even when wearing glasses/contacts?: No Does the patient have difficulty concentrating, remembering, or making decisions?: Yes Patient able to express need for assistance with ADLs?:  No Does the patient have difficulty dressing or bathing?: Yes Independently performs ADLs?: No Communication: Needs assistance Is this a change from baseline?: Pre-admission baseline Dressing (OT): Needs assistance Is this a change from baseline?: Pre-admission baseline Grooming: Needs assistance Is this a change from baseline?: Pre-admission baseline Feeding: Needs assistance Is this a change from baseline?: Pre-admission baseline Bathing: Needs assistance Is this a change from baseline?: Pre-admission baseline Toileting: Dependent Is this a change from baseline?: Pre-admission baseline In/Out Bed: Dependent Is this a change from baseline?: Pre-admission baseline Walks in Home: Dependent Is this a change from baseline?: Pre-admission baseline Does the patient have difficulty walking or climbing stairs?: Yes Weakness of Legs: Both Weakness of Arms/Hands: Both  Permission Sought/Granted                  Emotional Assessment Appearance:: Appears stated age Attitude/Demeanor/Rapport: Unable to Assess(doesnt speak english and hollers out in room)       Psych Involvement: No (comment)  Admission diagnosis:  Generalized abdominal pain [R10.84] RUQ abdominal pain [R10.11] Fever, unspecified fever cause [R50.9] Infiltrate of left lung present on chest x-ray [R91.8] Patient Active Problem List   Diagnosis Date Noted  . Pleural effusion, bilateral 09/09/2018  . Hyponatremia 09/09/2018  . Abdominal pain 09/09/2018  . Palliative care by specialist   . DNR (do not resuscitate) discussion   . Restlessness and agitation   . Paroxysmal atrial fibrillation (HCC) 08/29/2018  . Gait abnormality 08/26/2018  .  Acute CVA (cerebrovascular accident) (HCC) 08/18/2018  . Community acquired pneumonia 08/18/2018  . Anemia 08/18/2018  . HTN (hypertension) 08/18/2018  . Hyperlipidemia 08/18/2018   PCP:  Myles LippsSantiago, Irma M, MD Pharmacy:   [facility] [phone][id number] [address], [place] - [phone] [facility] [address] [place] [address] Phone: [phone] Fax: [phone]     Social Determinants of Health (SDOH) Interventions    Readmission Risk Interventions No flowsheet data found.

## 2018-09-10 NOTE — Progress Notes (Addendum)
PROGRESS NOTE  Nathan Bruce YJE:[id number] DOB: Jun 25, 1945 DOA: 09/08/2018 PCP: Rutherford Guys, MD   LOS: 1 day   Brief narrative: Nathan Bruce a 73 y.o.malewith medical history significant ofparoxysmal atrial fibrillation, hypertension, hyperlipidemia, recent admission on August 18, 2018 forCVA with residual left hemiplegia being brought to the hospital by EMS for evaluation of hypoxia. Per EMS report, patient is nonverbal at baseline. His wife called EMS because she noticed home oxygen saturation reading in the 60s. Oxygen saturation checked by EMS 100% on room air.  ED Course:Afebrile, not tachycardic, not tachypneic,andnot hypoxic. Blood pressure 150/63 on arrival. White count 13.7. COVID-19 rapid test negative.  Chest x-ray showed increasing left basilar infiltrate with bilateral effusions.  CT abdomen pelvis showed moderate to large bilateral pleural effusions. Right upper quadrant ultrasound showing persistent gallbladder wall thickening and no definite gallstone and absence of positive sonographic Murphy sign. CT head and MRI showed left posterior parietal low-density which is significantly enlarged from prior exam consistent with acute infarction. Also showing interval development of probable petechial hemorrhage involving the cortical gyri of the subacute right occipital infarction noted on prior exam.  Patient was admitted under hospitalist service for further management.  Subjective: Patient was seen and examined this morning. Sleeping. Not agitated or restless like yesterday.  Assessment/Plan:  Principal Problem:   Acute CVA (cerebrovascular accident) [facility]) Active Problems:   Community acquired pneumonia   Pleural effusion, bilateral   Hyponatremia   Abdominal pain   Palliative care by specialist   DNR (do not resuscitate) discussion   Restlessness and agitation  Community-acquired pneumonia -Not hypoxic.  No fever.  WBC count 13.7.  Chest x-ray showed  increasing left basilar infiltrate.  Started on IV Levaquin on admission which we will continue. WBC count improving, 9.8 today. Currently not requiring supplemental oxygen.  Moderate to large bilateral pleural effusions  -Patient currently is not short of breath or complaining of chest pain. However, he has bilateral moderate to large pleural effusion.  Ultrasound-guided thoracentesis ordered for today.    Stroke  -Imaging of head with findings as above.  Neurology consult was obtained.  Per neurology note, patient most likely has subacute infarction in the left posterior parietal lobe due tocompletion of prior left parietal strokevsnew embolic event. There are multiple other subacute embolic infarcts as well. Prior to admission, patient was on Eliquis for anticoagulation. Currently on hold. Continue statin.  Dysphagia - Patient has significant dysphagia since last stroke.  Speech therapy evaluation obtained. He also has osteophytes pushing on the pharynx.  Palliative care consult obtained.  Patient and family seems to be him towards nonaggressive care.  Mildhyponatremia Sodium 131 today.  Continue IV fluid.  Continue to monitor BMP.  Abdominal pain -Inconsistent in examination.  Patient does not seem to have any abdominal tenderness at the time of my evaluation.  CT scan of abdomen showed large amount of stool.  No clear evidence of acute cholecystitis.  No need of any surgical intervention at this time.   MiraLAX daily for constipation.  Paroxysmal atrial fibrillation Patient noted to be in A. fib during his recent hospitalization.Currently in sinus rhythm. Continue metoprolol for rate control.  Resume Eliquis.  Weight loss/elevated LFTs/concern for connective tissue disease Per prior labs, ANA negative, CCP positive (weak positive at 22), [id number]. Seen by GI on July 30, 2018 and EGD was recommended but not done at that time given COVID-19 outbreak andthe practice not doing  elective procedures. Patient was referred to rheumatology at  the time of discharge as he was also noted to have muscle tenderness and a skin rash.Per daughter, he was recently seen by rheumatology and they have not heard back about the lab results yet.  -Continue PPI for possible gastritis or peptic ulcer disease as previously recommended by GI -Ensure outpatient GI and rheumatology follow-up  Hypertension -Blood pressure goal:normotension per neurology -Continue home metoprolol -Hydralazine PRN  Mobility: Bedbound. Diet: luxury feeding DVT prophylaxis: Eliquis Code Status:  Code Status: Full Code  Family Communication: Discussed with daughter Opal Sidles at bedside Disposition Plan: Home likely in 1 to 2 days  Consultants:  Palliative care  Procedures:    Antimicrobials:  Anti-infectives (From admission, onward)   Start     Dose/Rate Route Frequency Ordered Stop   09/08/18 2300  levofloxacin (LEVAQUIN) IVPB 750 mg     750 mg 100 mL/hr over 90 Minutes Intravenous Every 24 hours 09/08/18 2248        Infusions:  . sodium chloride 100 mL/hr at 09/09/18 2312  . levofloxacin (LEVAQUIN) IV 750 mg (09/10/18 0046)    Scheduled Meds: . aspirin  325 mg Oral Daily  . atorvastatin  40 mg Oral q1800  . divalproex  250 mg Oral Q12H  . feeding supplement (ENSURE ENLIVE)  237 mL Oral BID BM  . metoprolol tartrate  12.5 mg Oral BID  . pantoprazole  40 mg Oral Daily  . polyethylene glycol  17 g Oral Daily  . vitamin B-12  500 mcg Oral Daily    PRN meds: acetaminophen **OR** acetaminophen (TYLENOL) oral liquid 160 mg/5 mL **OR** acetaminophen, fluticasone, hydrALAZINE, ondansetron (ZOFRAN) IV, oxyCODONE, senna-docusate   Objective: Vitals:   09/10/18 0825 09/10/18 1201  BP:  138/61  Pulse: 79   Resp: 19 18  Temp: 97.9 F (36.6 C) 98.2 F (36.8 C)  SpO2:  99%    Intake/Output Summary (Last 24 hours) at 09/10/2018 1354 Last data filed at 09/10/2018 0330 Gross per 24  hour  Intake 1738.51 ml  Output 450 ml  Net 1288.51 ml   Filed Weights   09/08/18 2257  Weight: 49.9 kg   Weight change:  Body mass index is 17.75 kg/m.   Physical Exam: General exam: Sleeping, not restless today.  Daughter at bedside Skin: No rashes, lesions or ulcers. HEENT: Atraumatic, normocephalic, supple neck, no obvious bleeding Lungs: Clear to auscultation bilaterally CVS: Regular rate and rhythm, no murmur GI/Abd soft, nontender, nondistended, bowel sound present CNS: Sleeping, opens eyes on sternal rub..  Asleep again Extremities: No pedal edema, no calf tenderness  Data Review: I have personally reviewed the laboratory data and studies available.  Recent Labs  Lab 09/08/18 1320 09/09/18 0421  WBC 13.7* 9.8  NEUTROABS 12.9*  --   HGB 11.6* 8.9*  HCT 36.3* 27.1*  MCV 73.5* 71.9*  PLT 323 270   Recent Labs  Lab 09/08/18 1320 09/09/18 0421  NA 132* 131*  K 4.4 3.7  CL 97* 99  CO2 23 24  GLUCOSE 123* 90  BUN 14 14  CREATININE 0.45* 0.47*  CALCIUM 8.5* 7.9*   Lipid Panel     Component Value Date/Time   CHOL 129 08/18/2018 0453   TRIG 131 08/18/2018 0453   HDL 19 (L) 08/18/2018 0453   CHOLHDL 6.8 08/18/2018 0453   VLDL 26 08/18/2018 0453   LDLCALC 84 08/18/2018 0453   Lab Results  Component Value Date   HGBA1C 4.9 08/18/2018   Terrilee Croak, MD  [facility] 09/10/2018

## 2018-09-11 ENCOUNTER — Telehealth: Payer: Self-pay | Admitting: Family Medicine

## 2018-09-11 DIAGNOSIS — Z66 Do not resuscitate: Secondary | ICD-10-CM

## 2018-09-11 MED ORDER — LEVOFLOXACIN 750 MG PO TABS: 750.0000 mg | ORAL_TABLET | Freq: Every day | ORAL | 0 refills | Status: AC

## 2018-09-11 MED ORDER — MORPHINE SULFATE (CONCENTRATE) 10 MG/0.5ML PO SOLN: 5.0000 mg | ORAL | Status: DC | PRN

## 2018-09-11 MED ORDER — DIVALPROEX SODIUM 125 MG PO CSDR: 250.0000 mg | DELAYED_RELEASE_CAPSULE | Freq: Two times a day (BID) | ORAL | Status: DC

## 2018-09-11 MED ORDER — MORPHINE SULFATE (CONCENTRATE) 10 MG/0.5ML PO SOLN: 5.0000 mg | ORAL | 0 refills | Status: AC | PRN

## 2018-09-11 MED ORDER — MORPHINE SULFATE (CONCENTRATE) 10 MG/0.5ML PO SOLN: 5.0000 mg | ORAL | 0 refills | Status: DC | PRN

## 2018-09-11 MED ORDER — DIVALPROEX SODIUM 125 MG PO CSDR: 125.0000 mg | DELAYED_RELEASE_CAPSULE | Freq: Two times a day (BID) | ORAL | 0 refills | Status: AC

## 2018-09-11 MED ORDER — VALPROATE SODIUM 500 MG/5ML IV SOLN
250.0000 mg | Freq: Once | INTRAVENOUS | Status: AC
  Administered 2018-09-11: 250 mg via INTRAVENOUS
  Filled 2018-09-11: qty 2.5

## 2018-09-11 NOTE — Plan of Care (Signed)
Adequate for discharge.

## 2018-09-11 NOTE — Telephone Encounter (Signed)
Sent message to the doctor for approval

## 2018-09-11 NOTE — Discharge Summary (Signed)
Physician Discharge Summary  Nathan Bruce OZH:[id number] DOB: 01/12/1946 DOA: 09/08/2018  PCP: Myles LippsSantiago, Irma M, MD  Admit date: 09/08/2018 Discharge date: 09/11/2018  Admitted From: Home Discharge disposition: Home with hospice   Code Status: DNR   Recommendations for Outpatient Follow-Up:   1. Per hospice  Discharge Diagnosis:   Principal Problem:   Acute CVA (cerebrovascular accident) [facility](HCC) Active Problems:   Community acquired pneumonia   Pleural effusion, bilateral   Hyponatremia   Abdominal pain   Palliative care by specialist   DNR (do not resuscitate) discussion   Restlessness and agitation   Adult failure to thrive   DNR (do not resuscitate)    History of Present Illness / Brief narrative:  Nathan Bruce a 73 y.o.malewith medical history significant ofparoxysmal atrial fibrillation, hypertension, hyperlipidemia, recent admission on August 18, 2018 forCVA with residual left hemiplegia being brought to the hospital by EMS for evaluation of hypoxia. Per EMS report, patient is nonverbal at baseline. His wife called EMS because she noticed home oxygen saturation reading in the 60s. Oxygen saturation checked by EMS 100% on room air.  ED Course:Afebrile, not tachycardic, not tachypneic,andnot hypoxic. Blood pressure 150/63 on arrival. White count 13.7. COVID-19 rapid test negative.  Chest x-rayshowedincreasing left basilar infiltrate with bilateral effusions.  CT abdomen pelvisshowedmoderate to large bilateral pleural effusions. Right upper quadrant ultrasound showing persistent gallbladder wall thickening and no definite gallstone and absence of positive sonographic Murphy sign. CT headand MRI showedleft posterior parietal low-density which is significantly enlarged from prior exam consistent with acute infarction. Also showing interval development of probable petechial hemorrhage involving the cortical gyri of the subacute right occipital infarction noted  on prior exam.  Patient was admitted under hospitalist service for further management.  Hospital Course:  Community-acquired pneumonia -Not hypoxic. No fever. WBC count was 13.7 on admission.. Chest x-ray showed increasing left basilar infiltrate. Started on IV Levaquin on admission.  WBC count improved.  No fever.  Currently not on oxygen supplementation.  Will discharge on 5 more days of oral Levaquin.  Moderate to large bilateralpleural effusions -Patient currently is not short of breath or complaining of chest pain. However, he has bilateral moderate to large pleural effusion.  We attempted for an ultrasound-guided thoracentesis, however he could not be done because patient was not cooperative to needle stick.  Subacute stroke -Imaging of head with findingsas above. Neurology consult was obtained. Per neurology note, patient most likely has subacute infarction in the left posterior parietal lobedue tocompletion of prior left parietal strokevsnew embolic event.There are multipleothersubacute embolic infarctsas well. Prior to admission, patient was on Eliquis for anticoagulation. Currently on hold. Continue statin.  Dysphagia - Patient has significant dysphagia since last stroke.  Speech therapy evaluation obtained.He also has osteophytes pushing on the pharynx. Palliative care consult obtained.  Patient and family wanted hospice care.  Tube feeding was not considered.  Abdominal pain -Inconsistent in examination. Patient does not seem to have any abdominal tenderness at the time of my evaluation. CT scan of abdomen showed large amount of stool. No clear evidence of acute cholecystitis.  No need of any surgical intervention at this time.  MiraLAX daily for constipation.  Paroxysmal atrial fibrillation Patient noted to be in A. fib during his recent hospitalization.Currently in sinus rhythm. Continue metoprolol for rate control.Resume Eliquis if patient is  cooperative to take it.  Hypertension -Blood pressure goal:normotension per neurology -Continue home metoprolol  Severe Malnutrition  - related to chronic illness(CVA, dysphagia) as evidenced  by percent weight loss, severe muscle depletion. Nutrition consult appreciated.  Poor prognosis. Palliative care consult appreciated.  Patient's daughter Hedy Jacob is a primary care giver at home.  Patient's wife has her own medical issues.  After multiple conversations, patient and family chose to go home with home hospice.  Hospice services started. Will discharge the patient home with hospice today.  Subjective:  Seen and examined this morning.  Awake, restless, yelling.  Daughter at bedside.  Discharge Exam:   Vitals:   09/10/18 2352 09/11/18 0410 09/11/18 0825 09/11/18 1143  BP: (!) 142/64 (!) 138/58 125/64 131/61  Pulse: 72 69 67 66  Resp: Temp: 98.6 F (37 C) 98.2 F (36.8 C) 97.6 F (36.4 C) 98.5 F (36.9 C)  TempSrc: Axillary Axillary Oral Oral  SpO2: 98% 98% 98% 98%  Weight:      Height:        Body mass index is 17.75 kg/m.  General exam: Awake, restless, yelling Skin: No rashes, lesions or ulcers. HEENT: Atraumatic, normocephalic, supple neck, no obvious bleeding Lungs: Clear to auscultation bilaterally CVS: Regular rate and rhythm, no murmur GI/Abd soft, nontender, nondistended, bowel sound present CNS: Restless, unable to follow commands Psychiatry: Restless Extremities: Pedal edema trace to 1+ in both legs  Discharge Instructions:  Wound care: None Discharge Instructions    Ambulatory referral to Neurology   Complete by:  As directed    Follow up with stroke clinic NP (Jessica Vanschaick or Darrol Angel, if both not available, consider Manson Allan, or Ahern) at [facility] in about 4 weeks. Thanks.   Diet - low sodium heart healthy   Complete by:  As directed    Increase activity slowly   Complete by:  As directed      Follow-up Information     [facility]. Schedule an appointment as soon as possible for a visit in 4 week(s).   Specialty:  Neurology Contact information: [address] [place] [address] [phone]         Allergies as of 09/11/2018      Reactions   Shellfish Allergy Itching, Rash   Mild      Medication List    STOP taking these medications   acetaminophen 500 MG tablet Commonly known as:  TYLENOL     TAKE these medications   apixaban 5 MG Tabs tablet Commonly known as:  ELIQUIS Take 1 tablet (5 mg total) by mouth 2 (two) times daily.   atorvastatin 40 MG tablet Commonly known as:  LIPITOR Take 1 tablet (40 mg total) by mouth daily at 6 PM.   divalproex 125 MG capsule Commonly known as:  DEPAKOTE SPRINKLE Take 1 capsule (125 mg total) by mouth every 12 (twelve) hours for 30 days.   Ensure Take 1 Can by mouth 2 (two) times daily between meals.   fluticasone 50 MCG/ACT nasal spray Commonly known as:  FLONASE Place 1 spray into both nostrils 2 (two) times daily. What changed:    when to take this  reasons to take this   guaiFENesin 600 MG 12 hr tablet Commonly known as:  MUCINEX Take 600 mg by mouth 2 (two) times daily.   levofloxacin 750 MG tablet Commonly known as:  Levaquin Take 1 tablet (750 mg total) by mouth daily for 5 days.   metoprolol tartrate 25 MG tablet Commonly known as:  LOPRESSOR Take 0.5 tablets (12.5 mg total) by mouth 2 (two) times daily.  morphine CONCENTRATE 10 MG/0.5ML Soln concentrated solution Take 0.25 mLs (5 mg total) by mouth every hour as needed for moderate pain or shortness of breath.   omeprazole 40 MG capsule Commonly known as:  PRILOSEC Take 1 capsule (40 mg total) by mouth daily.   oxyCODONE 5 MG immediate release tablet Commonly known as:  Roxicodone Take 1 tablet (5 mg total) by mouth every 8 (eight) hours as needed for moderate pain or severe pain.   polyethylene glycol powder 17 GM/SCOOP  powder Commonly known as:  GLYCOLAX/MIRALAX Take 17 g by mouth daily. What changed:    when to take this  reasons to take this   predniSONE 20 MG tablet Commonly known as:  DELTASONE Take 1.5 tablets (30 mg total) by mouth daily with breakfast for 7 days, THEN 1 tablet (20 mg total) daily with breakfast for 21 days. Start taking on:  Aug 29, 2018   vitamin B-12 500 MCG tablet Commonly known as:  CYANOCOBALAMIN Take 500 mcg by mouth daily.       Time coordinating discharge: 35 minutes  The results of significant diagnostics from this hospitalization (including imaging, microbiology, ancillary and laboratory) are listed below for reference.    Procedures and Diagnostic Studies:   Ct Head Wo Contrast  Result Date: 09/08/2018 CLINICAL DATA:  Altered level of consciousness. EXAM: CT HEAD WITHOUT CONTRAST TECHNIQUE: Contiguous axial images were obtained from the base of the skull through the vertex without intravenous contrast. COMPARISON:  CT scan of Aug 27, 2018. FINDINGS: Brain: Significantly increased left posterior parietal low density is noted concerning for acute infarction. Stable right occipital infarction is noted consistent with subacute infarction. There does appear to be some degree of petechial hemorrhage now present within the right occipital infarction. Mild chronic ischemic white matter disease is noted. No midline shift is noted. Ventricular size is within normal limits. Vascular: No hyperdense vessel or unexpected calcification. Skull: Normal. Negative for fracture or focal lesion. Sinuses/Orbits: No acute finding. Other: None. IMPRESSION: Left posterior parietal low density noted on prior exam is significantly enlarged currently consistent with acute infarction. Also noted is interval development of probable petechial hemorrhage involving the cortical gyri of the subacute right occipital infarction noted on prior exam. Critical Value/emergent results were called by telephone  at the time of interpretation on 09/08/2018 at 4:41 pm to Dr. Rush Landmark, who verbally acknowledged these results. Electronically Signed   By: Lupita Raider M.D.   On: 09/08/2018 16:42   Mr Brain Wo Contrast  Result Date: 09/08/2018 CLINICAL DATA:  73 y/o  M; delirium and altered mental status. EXAM: MRI HEAD WITHOUT CONTRAST TECHNIQUE: Multiplanar, multiecho pulse sequences of the brain and surrounding structures were obtained without intravenous contrast. COMPARISON:  09/08/2018 CT head.  08/18/2018 MRI head. FINDINGS: Brain: New area of reduced diffusion centered within the left posterior parietal lobe spanning 4.7 x 3.5 x 4.8 cm (volume = 41 cm^3) compatible with acute infarction corresponding to hypodensity on the prior CT of head. There is susceptibility a vascular structures within the area of acute infarction may represent vessel enlargement and/or thrombus associated with the stroke (series 9, image 57). A component of petechial hemorrhage may be present. Multiple areas of diffusion hyperintensity on the prior MRI of the brain in the right greater than left cerebral hemispheres within the right ACA and PCA distributions as well as multiple left left-sided vascular territories now demonstrates mildly reduced diffusion on ADC compatible with expected evolution to subacute infarction. The areas  of subacute infarction are T2 FLAIR hyperintense with mild local mass effect. Persistent minimal petechial hemorrhage within the right medial parietal lobe subacute infarction. New faint foci of susceptibility hypointensity within the left occipital lobe subacute infarct (series 9, image 55) compatible with interval petechial hemorrhage. There is been interval development of laminar necrosis within right medial frontal and parietal subacute infarctions as well as left parietal and occipital subacute infarctions. Small infarcts within the left cerebellar hemisphere and the right caudate head on the prior MRI no longer  demonstrate reduced diffusion compatible with late subacute to chronic etiology. No hydrocephalus, herniation, significant mass effect, or large gross hemorrhage. Vascular: Normal flow voids. Skull and upper cervical spine: Normal marrow signal. Sinuses/Orbits: Negative. Other: Left intra-ocular lens replacement. IMPRESSION: 1. Interval development of an acute infarction centered in the left posterior parietal lobe measuring up to 4.8 cm, 41 cc. Susceptibility blooming of vascular structures within the infarct may represent hyperemia and/or small vessel thrombus related to the stroke. Mild petechial hemorrhage not excluded. 2. Multiple now subacute infarctions in multiple vascular territories are stable in distribution from the prior MRI of the brain. Stable minimal petechial hemorrhage within the right medial parietal lobe infarction. New minimal petechial hemorrhage within the left occipital lobe infarction. 3. Small now late subacute to chronic infarcts in left cerebellar hemisphere and right caudate head no longer have reduced diffusion. These results were called by telephone at the time of interpretation on 09/08/2018 at 10:15 pm to Dr. Lynden Oxford , who verbally acknowledged these results. Electronically Signed   By: Mitzi Hansen M.D.   On: 09/08/2018 22:18   Ct Abdomen Pelvis W Contrast  Result Date: 09/08/2018 CLINICAL DATA:  Low oxygen concentration.  Abdominal pain. EXAM: CT ABDOMEN AND PELVIS WITH CONTRAST TECHNIQUE: Multidetector CT imaging of the abdomen and pelvis was performed using the standard protocol following bolus administration of intravenous contrast. CONTRAST:  OMNIPAQUE IOHEXOL 300 MG/ML  SOLN COMPARISON:  09/29/2014 FINDINGS: Lower chest: There are moderate to large bilateral pleural effusions which are only partially visualized on this exam. There is adjacent atelectasis. Heart is mildly enlarged. Hepatobiliary: No focal liver abnormality is seen. There is mild  gallbladder wall thickening. There is no evidence of biliary ductal dilatation. Pancreas: Unremarkable. No pancreatic ductal dilatation or surrounding inflammatory changes. Spleen: Normal in size without focal abnormality. Adrenals/Urinary Tract: The bilateral adrenal glands are unremarkable. The right kidney is unremarkable with no hydronephrosis. There is a 5-6 mm nonobstructing stone in the upper pole of the left kidney. The bladder is unremarkable. Stomach/Bowel: There is a large amount of stool in the rectum. There is no evidence of a small-bowel obstruction. There is some mild gastric wall thickening. Vascular/Lymphatic: Aortic atherosclerosis. No enlarged abdominal or pelvic lymph nodes. 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 Reproductive: Prostate is unremarkable. Other: There is diffuse body wall edema. There is a trace amount of free fluid in the abdomen. Musculoskeletal: There are advanced degenerative changes throughout the visualized lumbar spine. There is no displaced fracture. IMPRESSION: 1. Moderate to large bilateral pleural effusions. 2. Mild-to-moderate anasarca. 3. Large amount of stool in the rectum. 4. Nonobstructing stone in the upper pole the left kidney. No hydronephrosis. 5. Mild gallbladder wall thickening which is nonspecific and can be seen in the presence of ascites or heart failure. If there is clinical concern for acute cholecystitis follow-up with ultrasound is recommended. Electronically Signed   By: Katherine Mantle M.D.   On:  09/08/2018 16:41   Dg Chest Port 1 View  Result Date: 09/08/2018 CLINICAL DATA:  Shortness of breath EXAM: PORTABLE CHEST 1 VIEW COMPARISON:  08/17/2018 FINDINGS: Cardiac shadow is mildly prominent but stable. Aortic calcifications are seen. Bilateral pleural effusions are noted as well as left lower lobe consolidation. This is increased from the prior exam. No pneumothorax is seen. No bony abnormality is  noted. IMPRESSION: Increasing left basilar infiltrate with bilateral effusions. Electronically Signed   By: Alcide Clever M.D.   On: 09/08/2018 12:14   US Abdomen Limited Ruq  Result Date: 09/08/2018 CLINICAL DATA:  Right upper quadrant abdominal pain with fever. EXAM: ULTRASOUND ABDOMEN LIMITED RIGHT UPPER QUADRANT COMPARISON:  CT from the same day.  Ultrasound dated 05/02 2020 FINDINGS: Gallbladder: There is diffuse gallbladder wall thickening with the gallbladder wall measuring up to approximately 6 mm in width. No gallstones are identified. There is no biliary ductal dilatation. The sonographic Eulah Pont sign is reported as negative. The gallbladder does appear to be somewhat distended. Common bile duct: Diameter: 2.5 mm Liver: No focal lesion identified. Within normal limits in parenchymal echogenicity. Portal vein is patent on color Doppler imaging with normal direction of blood flow towards the liver. A right-sided pleural effusion is again noted. IMPRESSION: 1. Persistent gallbladder wall thickening, however on today's exam no definite gallstones are identified. In the absence of a positive sonographic Murphy sign, gallbladder wall thickening is nonspecific. 2. Right-sided pleural effusion again noted. Electronically Signed   By: Katherine Mantle M.D.   On: 09/08/2018 19:59     Labs:   Basic Metabolic Panel: Recent Labs  Lab 09/08/18 1320 09/09/18 0421  NA 132* 131*  K 4.4 3.7  CL 97* 99  CO2 23 24  GLUCOSE 123* 90  BUN 14 14  CREATININE 0.45* 0.47*  CALCIUM 8.5* 7.9*   GFR Estimated Creatinine Clearance: 57.2 mL/min (A) (by C-G formula based on SCr of 0.47 mg/dL (L)). Liver Function Tests: Recent Labs  Lab 09/08/18 1515  AST 139*  ALT 95*  ALKPHOS 72  BILITOT 1.1  PROT 5.7*  ALBUMIN 2.5*   Recent Labs  Lab 09/08/18 1515  LIPASE 36   No results for input(s): AMMONIA in the last 168 hours. Coagulation profile No results for input(s): INR, PROTIME in the last 168  hours.  CBC: Recent Labs  Lab 09/08/18 1320 09/09/18 0421  WBC 13.7* 9.8  NEUTROABS 12.9*  --   HGB 11.6* 8.9*  HCT 36.3* 27.1*  MCV 73.5* 71.9*  PLT 323 270   Cardiac Enzymes: No results for input(s): CKTOTAL, CKMB, CKMBINDEX, TROPONINI in the last 168 hours. BNP: Invalid input(s): POCBNP CBG: No results for input(s): GLUCAP in the last 168 hours. D-Dimer No results for input(s): DDIMER in the last 72 hours. Hgb A1c No results for input(s): HGBA1C in the last 72 hours. Lipid Profile No results for input(s): CHOL, HDL, LDLCALC, TRIG, CHOLHDL, LDLDIRECT in the last 72 hours. Thyroid function studies No results for input(s): TSH, T4TOTAL, T3FREE, THYROIDAB in the last 72 hours.  Invalid input(s): FREET3 Anemia work up No results for input(s): VITAMINB12, FOLATE, FERRITIN, TIBC, IRON, RETICCTPCT in the last 72 hours. Microbiology Recent Results (from the past 240 hour(s))  Novel Coronavirus,NAA,(SEND-OUT TO REF LAB - TAT 24-48 hrs); Hosp Order     Status: None   Collection Time: 09/08/18 11:17 AM  Result Value Ref Range Status   SARS-CoV-2, NAA NOT DETECTED NOT DETECTED Final    Comment: (NOTE) This test was  developed and its performance characteristics determined by [employer]. This test has not been FDA cleared or approved. This test has been authorized by FDA under an Emergency Use Authorization (EUA). This test is only authorized for the duration of time the declaration that circumstances exist justifying the authorization of the emergency use of in vitro diagnostic tests for detection of SARS-CoV-2 virus and/or diagnosis of COVID-19 infection under section 564(b)(1) of the Act, 21 U.S.C. 409WJX-9(J)(4), unless the authorization is terminated or revoked sooner. When diagnostic testing is negative, the possibility of a false negative result should be considered in the context of a patient's recent exposures and the presence of clinical signs and symptoms  consistent with COVID-19. An individual without symptoms of COVID-19 and who is not shedding SARS-CoV-2 virus would expect to have a negative (not detected) result in this assay. Performed  At: [facility] [address] [place], [place] [id number] Jolene Schimke MD YQ:[id number]    Coronavirus Source NASOPHARYNGEAL  Final    Comment: Performed at [facility], [address] [place], [place] [address]  SARS Coronavirus 2 (CEPHEID- Performed in [facility] hospital lab), Hosp Order     Status: None   Collection Time: 09/08/18  5:02 PM  Result Value Ref Range Status   SARS Coronavirus 2 NEGATIVE NEGATIVE Final    Comment: (NOTE) If result is NEGATIVE SARS-CoV-2 target nucleic acids are NOT DETECTED. The SARS-CoV-2 RNA is generally detectable in upper and lower  respiratory specimens during the acute phase of infection. The lowest  concentration of SARS-CoV-2 viral copies this assay can detect is 250  copies / mL. A negative result does not preclude SARS-CoV-2 infection  and should not be used as the sole basis for treatment or other  patient management decisions.  A negative result may occur with  improper specimen collection / handling, submission of specimen other  than nasopharyngeal swab, presence of viral mutation(s) within the  areas targeted by this assay, and inadequate number of viral copies  (<250 copies / mL). A negative result must be combined with clinical  observations, patient history, and epidemiological information. If result is POSITIVE SARS-CoV-2 target nucleic acids are DETECTED. The SARS-CoV-2 RNA is generally detectable in upper and lower  respiratory specimens dur ing the acute phase of infection.  Positive  results are indicative of active infection with SARS-CoV-2.  Clinical  correlation with patient history and other diagnostic information is  necessary to determine patient infection status.  Positive results do  not rule out bacterial  infection or co-infection with other viruses. If result is PRESUMPTIVE POSTIVE SARS-CoV-2 nucleic acids MAY BE PRESENT.   A presumptive positive result was obtained on the submitted specimen  and confirmed on repeat testing.  While 2019 novel coronavirus  (SARS-CoV-2) nucleic acids may be present in the submitted sample  additional confirmatory testing may be necessary for epidemiological  and / or clinical management purposes  to differentiate between  SARS-CoV-2 and other Sarbecovirus currently known to infect humans.  If clinically indicated additional testing with an alternate test  methodology [phone]) is advised. The SARS-CoV-2 RNA is generally  detectable in upper and lower respiratory sp ecimens during the acute  phase of infection. The expected result is Negative. Fact Sheet for Patients:  BoilerBrush.com.cy Fact Sheet for Healthcare Providers: https://pope.com/ This test is not yet approved or cleared by the [place] FDA and has been authorized for detection and/or diagnosis of SARS-CoV-2 by FDA under an Emergency Use Authorization (EUA).  This EUA will remain in effect (meaning this test can be used) for the duration of the COVID-19 declaration under Section 564(b)(1) of the Act, 21 U.S.C. section 360bbb-3(b)(1), unless the authorization is terminated or revoked sooner. Performed at [facility], [address] [place], [place] [address]   Urine culture     Status: Abnormal (Preliminary result)   Collection Time: 09/09/18 10:42 AM  Result Value Ref Range Status   Specimen Description URINE, CLEAN CATCH  Final   Special Requests NONE  Final   Culture (A)  Final    80,000 COLONIES/mL PROTEUS MIRABILIS SUSCEPTIBILITIES TO FOLLOW Performed at [facility], [address], [place], [place] [address]    Report Status PENDING  Incomplete    Signed: Melina Schools Shakai Dolley  [facility] 09/11/2018, 2:56  PM

## 2018-09-11 NOTE — Telephone Encounter (Signed)
Copied from CRM [id number]. Topic: General - Other >> Sep 11, 2018  3:27 PM Jaquita Rector A wrote: Reason for CRM: Candise Bowens with [facility] hosp[facility] called to say patient will be discharged on 09/12/2018 from the hospital and want to know if Dr Leretha Pol will be his attending and also she need a call back to get permission for them to go see the patient. Please call Ph# [phone]

## 2018-09-11 NOTE — Progress Notes (Signed)
Patient ID: Nathan Bruce, male   DOB: 06-22-1945, 73 y.o.   MRN: [id number]  This NP visited patient at the bedside as a follow up for palliative medicine needs and emotional support.  Daughter at bedside.  Continued conversation regarding current medical situation, goals of care and anticipatory care needs.  Daughter was able to have conversation with the rest of the family at home yesterday with the assistance of [name]  # [phone]  Family all agree that comfort and dignity are the focus of care.  Plan of care: -DNR/DNI -No artificial feeding now or in the future -Oral antibiotics only -Avoid rehospitalization -Hospice services at home-will write for choice  -Prognosis is likley less than 3 months   We discussed hospice benefit at home and the options of residential hospice when appropriate    MOST form completed, hard copy/pink with family.  Questions and concerns addressed   Discussed with Dr Pola Corn   Total time spent on the unit was 45 minutes  Greater than 50% of the time was spent in counseling and coordination of care  Lorinda Creed NP  Palliative Medicine Team Team Phone # [phone] Pager [phone]

## 2018-09-11 NOTE — Progress Notes (Signed)
Nutrition Follow-up  DOCUMENTATION CODES:   Severe malnutrition in context of chronic illness, Underweight  INTERVENTION:   Continue comfort feeds.   Continue Ensure Enlive po BID as appropriate, each supplement provides 350 kcal and 20 grams of protein.  NUTRITION DIAGNOSIS:   Severe Malnutrition related to chronic illness(CVA, dysphagia) as evidenced by percent weight loss, severe muscle depletion  GOAL:   Patient will meet greater than or equal to 90% of their needs; not met  MONITOR:   PO intake, Supplement acceptance, Weight trends, Labs, Skin, I & O's  REASON FOR ASSESSMENT:   Malnutrition Screening Tool    ASSESSMENT:   73 y.o. male with medical history significant of paroxysmal atrial fibrillation, hypertension, hyperlipidemia, recent admission on August 18, 2018 for CVA with residual left hemiplegia being brought to the hospital presents for evaluation of hypoxia. Chest x-ray showed increasing left basilar infiltrate with bilateral effusions.  CT head and MRI showed left posterior parietal low-density consistent with acute infarction  Per Palliative, family desires focus of care to be comfort with no artificial feeding. Plans for home with hospice. Recommend continuation of comfort feeds and Ensure supplementation as appropriate. Daughter at bedside reports pt usually consumes 2 meals a day with an Ensure once daily. Meals at home usually consists of very soft, very well cooked foods such as rice, soup/stew, and minced hot dogs. Daughter reports pt po intake has been gradually decreasing. Pt with a 12% weight loss in 3 months per weight records, significant for time frame. Pt meets criteria for severe malnutrition.   NUTRITION - FOCUSED PHYSICAL EXAM:    Most Recent Value  Orbital Region  Unable to assess  Upper Arm Region  Moderate depletion  Thoracic and Lumbar Region  Moderate depletion  Buccal Region  Unable to assess  Temple Region  Unable to assess  Clavicle  Bone Region  Severe depletion  Clavicle and Acromion Bone Region  Severe depletion  Scapular Bone Region  Unable to assess  Dorsal Hand  Unable to assess  Patellar Region  Moderate depletion  Anterior Thigh Region  Severe depletion  Posterior Calf Region  Unable to assess [edema]  Edema (RD Assessment)  Moderate  Hair  Reviewed  Eyes  Reviewed  Mouth  Reviewed  Skin  Reviewed  Nails  Reviewed       Diet Order:   Diet Order            Diet clear liquid Room service appropriate? Yes; Fluid consistency: Thin  Diet effective now              EDUCATION NEEDS:   Not appropriate for education at this time  Skin:  Skin Assessment: Reviewed RN Assessment  Last BM:  5/18  Height:   Ht Readings from Last 1 Encounters:  09/08/18 '5\' 6"'  (1.676 m)    Weight:   Wt Readings from Last 1 Encounters:  09/08/18 49.9 kg    Ideal Body Weight:  64.5 kg  BMI:  Body mass index is 17.75 kg/m.  Estimated Nutritional Needs:   Kcal:  1700-1850  Protein:  75-85 grams  Fluid:  1.7 - 1.9 L/day    Corrin Parker, MS, RD, LDN Pager # [phone] After hours/ weekend pager # [phone]

## 2018-09-11 NOTE — TOC Transition Note (Signed)
Transition of Care [facility]) - CM/SW Discharge Note   Patient Details  Name: Nathan Bruce MRN: [id number] Date of Birth: 02-02-1946  Transition [facility]) CM/SW Contact:  Kermit Balo, RN Phone Number: 09/11/2018, 3:22 PM   Clinical Narrative:    Pt discharging home with hospice care. [place] with [facility] called and spoke with daughter: [name].  Daughter requesting d/c to home today. MD aware and [place] aware. Hospice will see him in the am.  Daughter requesting PTAR home. CM called and arranged transport. Bedside RN aware. D/c packet at the desk.    Final next level of care: Home w Hospice Care Barriers to Discharge: Barriers Resolved   Patient Goals and CMS Choice Patient states their goals for this hospitalization and ongoing recovery are:: daughter: to get dad home CMS Medicare.gov Compare Post Acute Care list provided to:: Patient Represenative (must comment)(daughter) Choice offered to / list presented to : Adult Children  Discharge Placement                       Discharge Plan and Services   Discharge Planning Services: CM Consult Post Acute Care Choice: Hospice                      [facility] Agency: [facility] [place] Date [facility] Agency Contacted: 09/11/18 Time HH Agency Contacted: 1401 Representative spoke with at [facility] Agency: [place]  Social Determinants of Health (SDOH) Interventions     Readmission Risk Interventions No flowsheet data found.

## 2018-09-11 NOTE — Progress Notes (Signed)
Patent examiner [facility])  Hospital Liaison: RN note  Notified by Fabio Neighbors, CMRN of patient/family request for [facility] services at home after discharge. Chart and patient information under review by [facility] physician. Hospice eligibility pending at this time.     Writer spoke with  Daughter [name] by phone to initiate education related to hospice philosophy, services and team approach to care.  [name] verbalized understanding of information given. Per discussion, plan is for discharge to home by PTAR.     Please send signed and completed DNR form home with patient/family. Patient will need prescriptions for discharge comfort medications.     DME needs have been discussed, patient currently has the following equipment in the home: hospital bed.  Patient/family requests the following DME for delivery to the home:  W/C and 3N1. [facility] equipment manager has been notified and will contact [employer] to arrange delivery to the home. Home address has been verified and is correct in the chart. [name]  is the family member to contact to arrange time of delivery.     [facility] aware of the above. Please notify [facility] when patient is ready to leave the unit at discharge. (Call [phone] or [phone] after 5pm.) [facility] information and contact numbers given to  Daughter.      Please call with any hospice related questions.     Thank you for this referral.     Elsie Saas, RN, CCM  [facility] Acute Care Liaison (listed on AMION under [facility] [place])  [phone]  ?

## 2018-09-11 NOTE — Progress Notes (Signed)
  Speech Language Pathology Treatment: Dysphagia  Patient Details Name: Nathan Bruce MRN: [id number] DOB: 04-11-1946 Today's Date: 09/11/2018 Time: 7614-7092 SLP Time Calculation (min) (ACUTE ONLY): 16 min  Assessment / Plan / Recommendation Clinical Impression  Leaving for home with hospice today. Met with daughter to complete education for swallow and answer questions. Pt actually smiled several times today and requested liquid. Daughter suggested Coke- he only wanted several sips. No overt cough, throat clear. Multiple swallows then began to expectorate similar to yesterday. Expectorate was saliva with increased production after eating/drinking and accumulates. Daughter cues him to swallow but he prefers to expectorate. Discussed that pt can have what he would like to try- liquids will be easiest but if desires soft noodle,can give with small amounts and supervision. Will discharge ST at present.    HPI HPI: Nathan Bruce is a 73 y.o. male with medical history significant of paroxysmal atrial fibrillation, hypertension, hyperlipidemia, recent admission on August 18, 2018 for CVA with residual left hemiplegia being brought to the hospital by EMS for evaluation of hypoxia. Per chart he is eating very little which is a significant change from his baseline and  patient points to his abdomen and complains of pain and vomits a small amount of mucus after eating or taking a medication. In addition pt has had a 50 pounds since January. CXR increasing left basilar infiltrate with bilateral effusions. MBS 4/29 with cervical ostephytes impinging on pharynx restricting full transit through UES, flash penetration. Dys 3, thin recommended.       SLP Plan  Other (Comment)(all education completed)       Recommendations  Diet recommendations: Thin liquid(whatever pt desires and able to transit-home with hospice) Liquids provided via: Cup;Straw Medication Administration: Crushed with puree Supervision: Staff to  assist with self feeding;Full supervision/cueing for compensatory strategies Compensations: Slow rate;Small sips/bites;Minimize environmental distractions;Multiple dry swallows after each bite/sip Postural Changes and/or Swallow Maneuvers: Seated upright 90 degrees                Oral Care Recommendations: Oral care BID Follow up Recommendations: 24 hour supervision/assistance SLP Visit Diagnosis: Dysphagia, pharyngoesophageal phase (R13.14) Plan: Other (Comment)(all education completed)       GO                Houston Siren 09/11/2018, 3:16 PM   Orbie Pyo Colvin Caroli.Ed Risk analyst [phone] Office [phone]

## 2018-09-12 ENCOUNTER — Telehealth: Payer: Self-pay

## 2018-09-12 LAB — URINE CULTURE: Culture: 80000 — AB

## 2018-09-12 NOTE — Telephone Encounter (Signed)
Spoke with Candise Bowens with [facility], Dr. Leretha Pol did give permission to care for the patient

## 2018-09-16 ENCOUNTER — Telehealth: Payer: Self-pay | Admitting: Family Medicine

## 2018-09-17 ENCOUNTER — Telehealth: Payer: Self-pay

## 2018-09-17 NOTE — Telephone Encounter (Signed)
Patient has passed

## 2018-09-17 NOTE — Telephone Encounter (Signed)
Spoke with Lupita Leash, she will be coming today to pick up the death certificate for Mr. Broas. Lupita Leash can be reached at [phone].

## 2018-09-17 NOTE — Telephone Encounter (Signed)
completed

## 2018-09-22 NOTE — Telephone Encounter (Signed)
Nathan Bruce [facility] dropped off death certificate to be filled out.

## 2018-09-22 DEATH — deceased

## 2018-10-01 ENCOUNTER — Ambulatory Visit: Payer: Self-pay | Admitting: Neurology

## 2018-10-20 ENCOUNTER — Telehealth: Payer: Self-pay | Admitting: Family Medicine

## 2018-10-20 NOTE — Telephone Encounter (Signed)
Nathan Bruce with [facility] called and stated that he received fax for patient but only received the first page. He would like this re faxed.    Fax#[phone]  [phone]

## 2018-10-27 NOTE — Telephone Encounter (Signed)
Nathan Bruce is calling back from [facility] requesting if referral can be resent. He only received the first page. Please advise CB- [phone] Fax [phone]

## 2018-10-28 NOTE — Telephone Encounter (Signed)
Pt has passed away.  

## 2020-01-22 IMAGING — DX PORTABLE CHEST - 1 VIEW
1 series · 1 of 1 positions shown · non-contrast
Comparison: 08/17/2018

CLINICAL DATA: Shortness of breath

EXAM:
PORTABLE CHEST 1 VIEW

[chest ap]
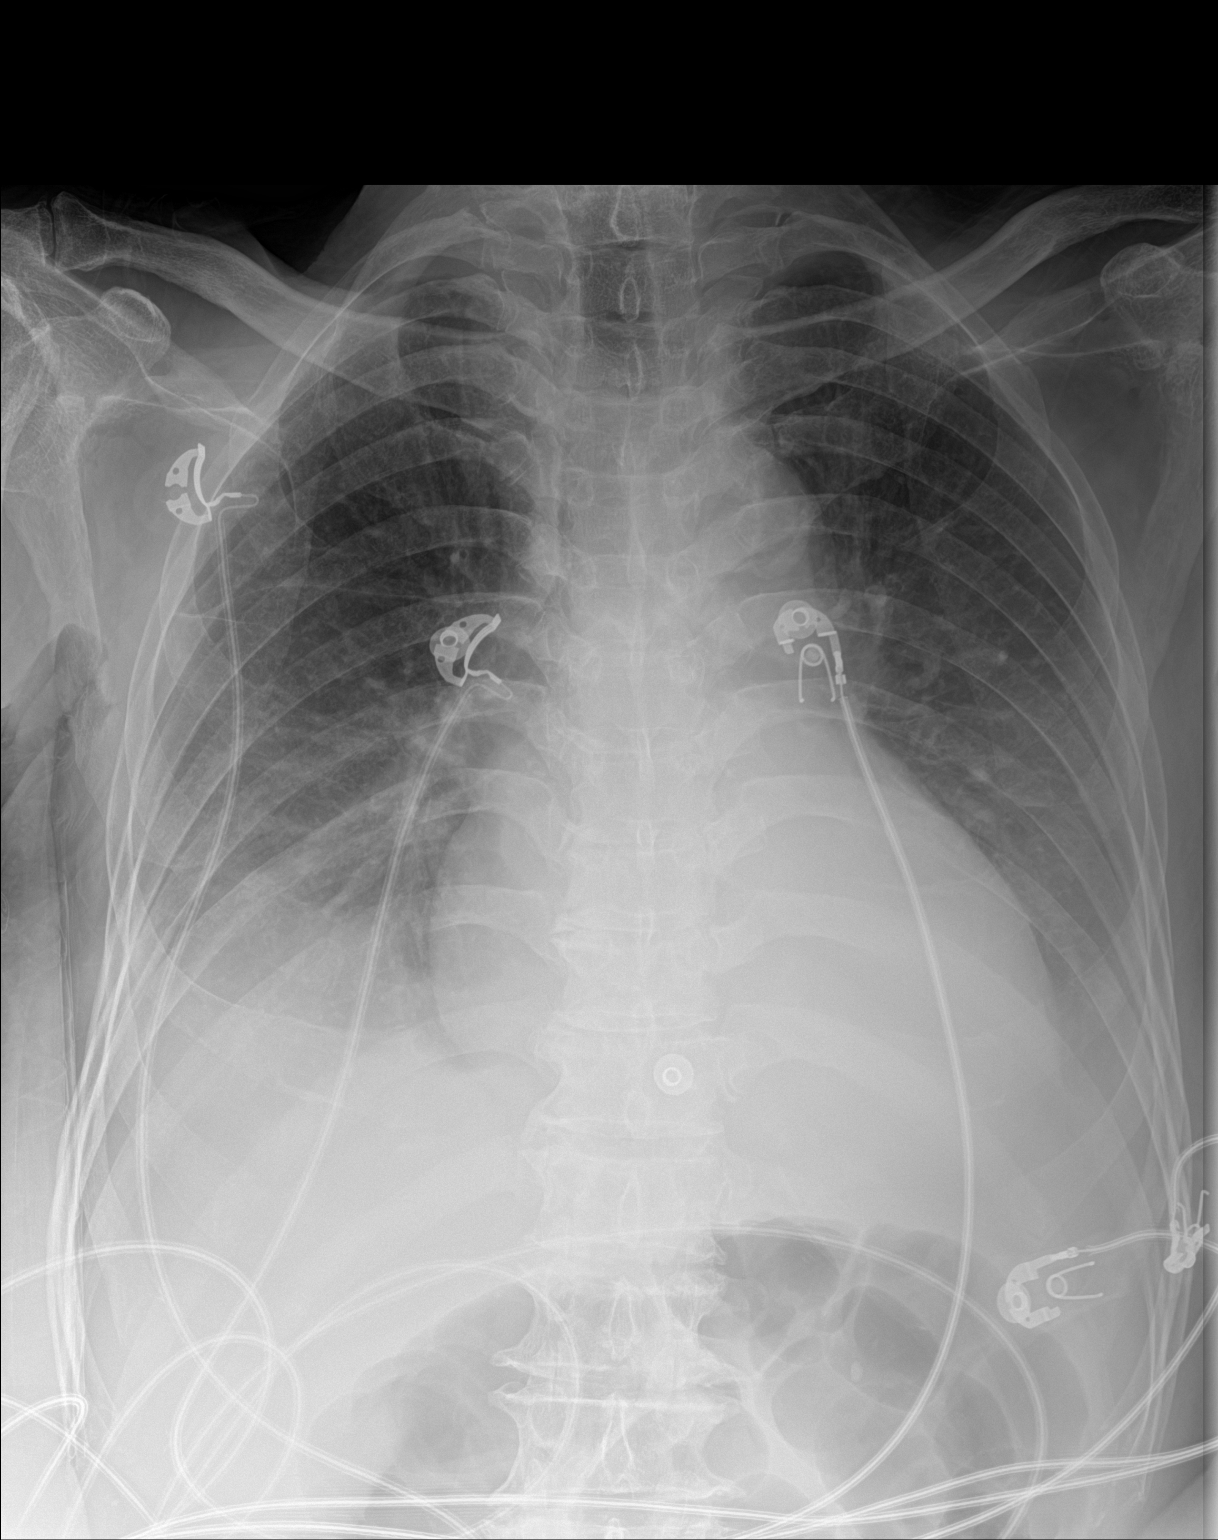

[1 of 1 positions shown; findings below may reference images not displayed]

FINDINGS: Cardiac shadow is mildly prominent but stable. Aortic calcifications
are seen. Bilateral pleural effusions are noted as well as left
lower lobe consolidation. This is increased from the prior exam. No
pneumothorax is seen. No bony abnormality is noted.
IMPRESSION: Increasing left basilar infiltrate with bilateral effusions.

## 2020-01-22 IMAGING — CT CT ABDOMEN AND PELVIS WITH CONTRAST
2 of 5 series · 16 of 46 positions shown, 18 images · IV contrast (Omni 300)
Comparison: 09/29/2014

CLINICAL DATA: Low oxygen concentration.  Abdominal pain.

EXAM:
CT ABDOMEN AND PELVIS WITH CONTRAST
TECHNIQUE: Multidetector CT imaging of the abdomen and pelvis was performed
using the standard protocol following bolus administration of
intravenous contrast.
CONTRAST:  100mL OMNIPAQUE IOHEXOL 300 MG/ML  SOLN

[Series 3: a/p w/ 5mm · axial · 0.85mm/px · z∈[-866,-436]mm · 13 of 98 slices shown, 15 images]
[im 6/98  soft-tissue]
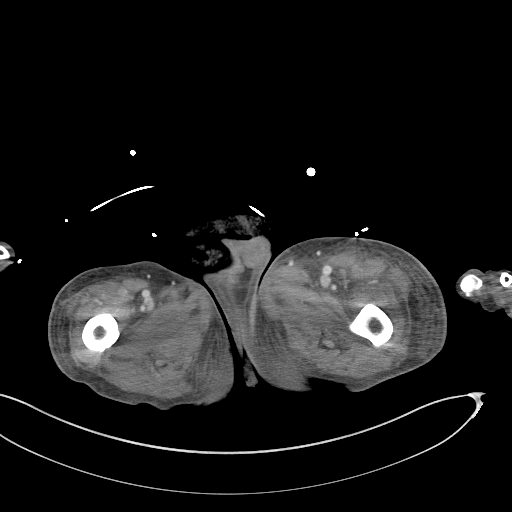
[im 6/98  bone]
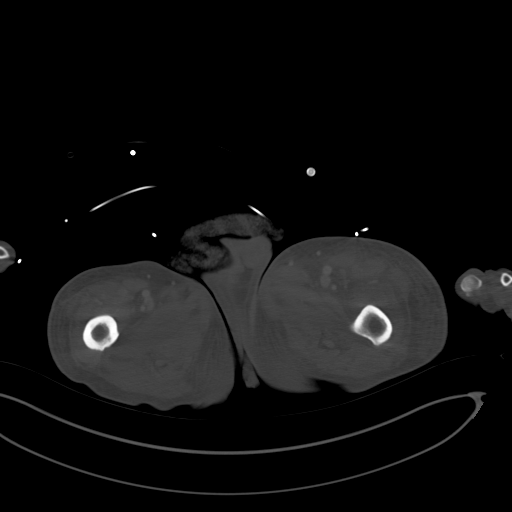
[im 11/98  soft-tissue]
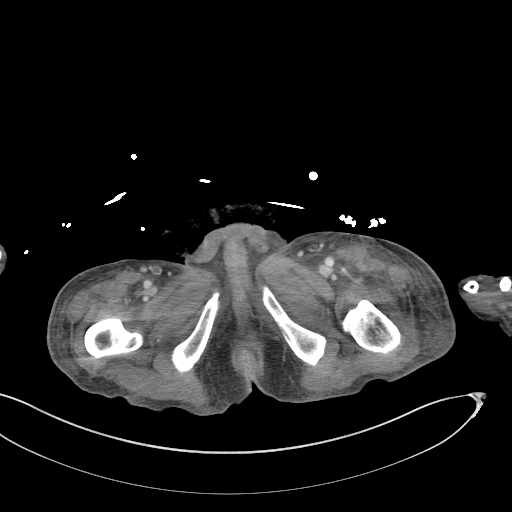
[im 22/98  soft-tissue]
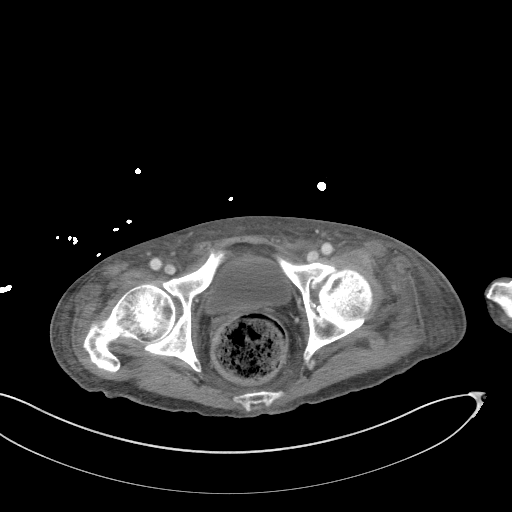
[im 27/98  soft-tissue]
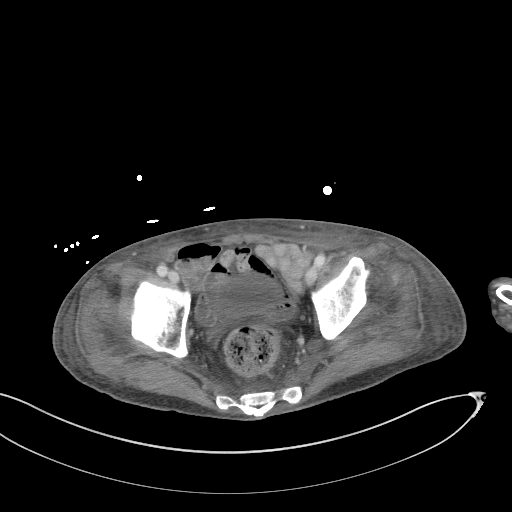
[im 33/98  soft-tissue]
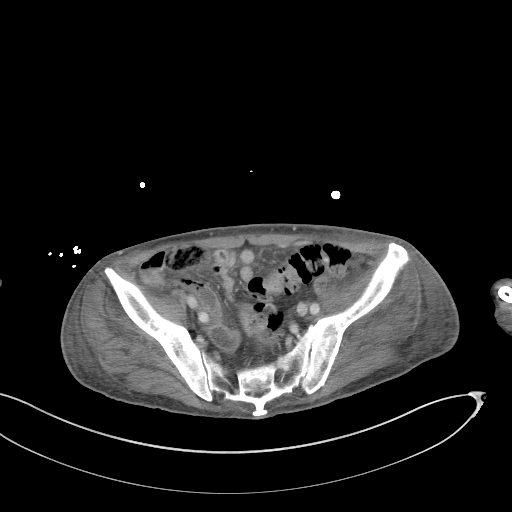
[im 44/98  soft-tissue]
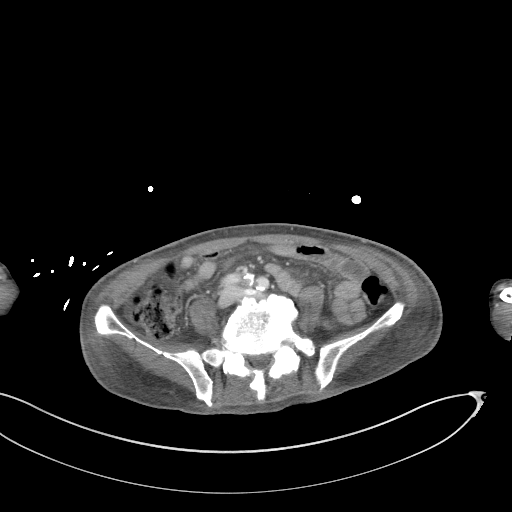
[im 49/98  soft-tissue]
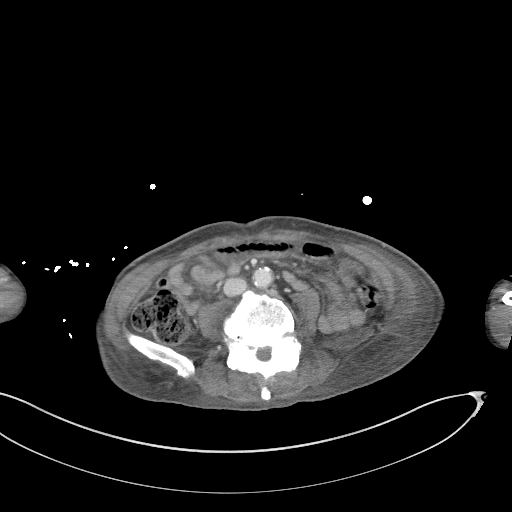
[im 54/98  soft-tissue]
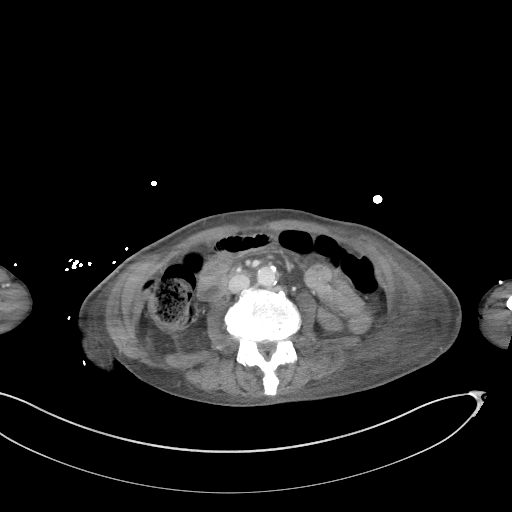
[im 65/98  soft-tissue]
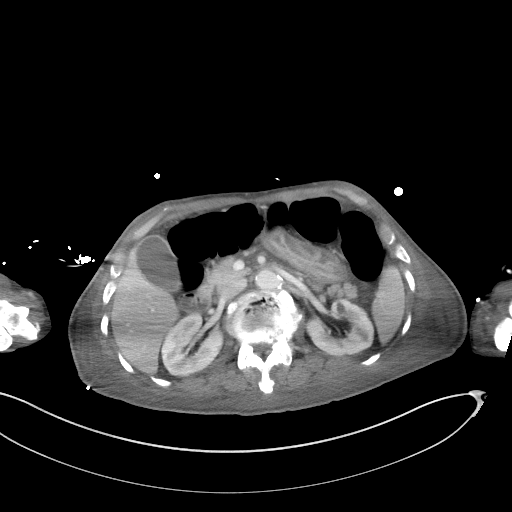
[im 65/98  bone]
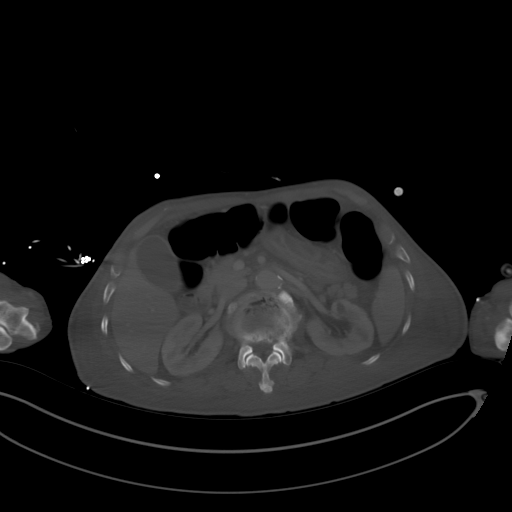
[im 71/98  soft-tissue]
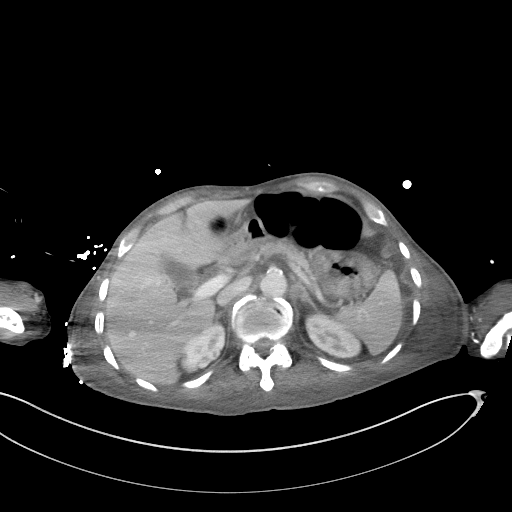
[im 76/98  soft-tissue]
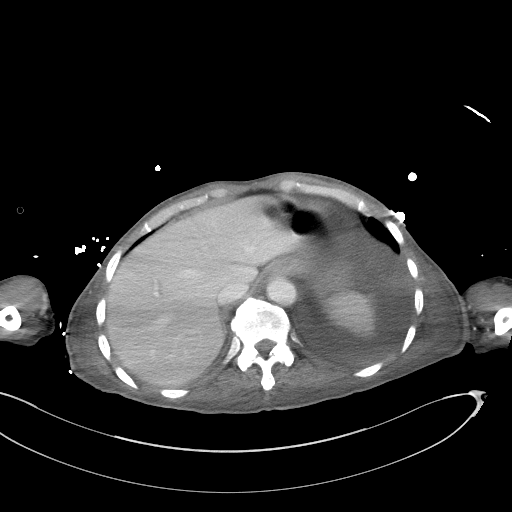
[im 87/98  soft-tissue]
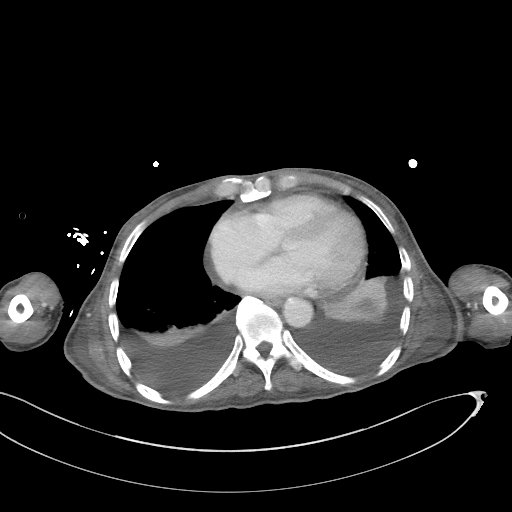
[im 92/98  soft-tissue]
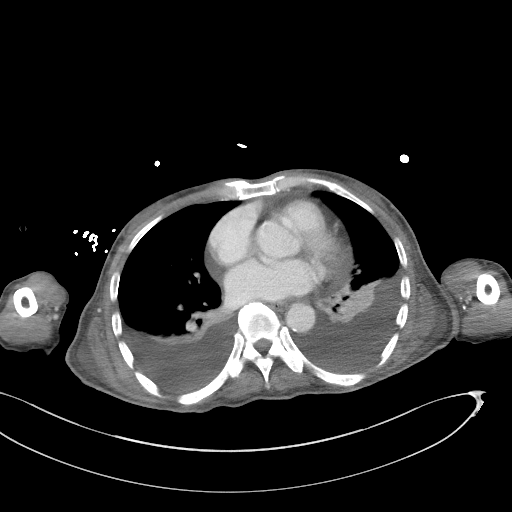

[Series 6: a/p w/ cor · coronal · 0.83mm/px · 3 of 107 slices shown]
[im 36/107  soft-tissue]
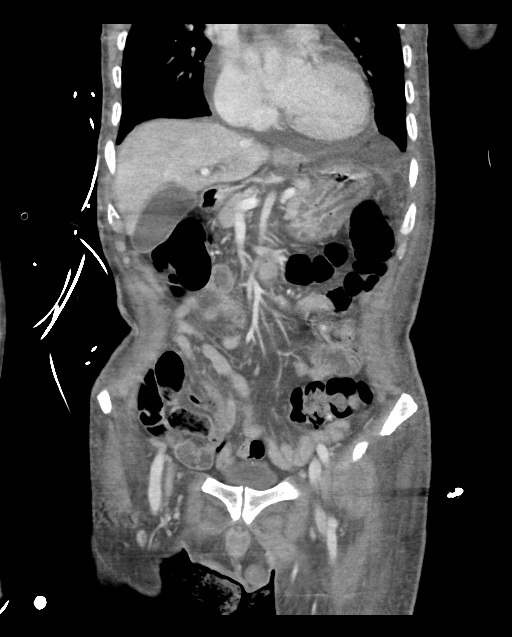
[im 48/107  soft-tissue]
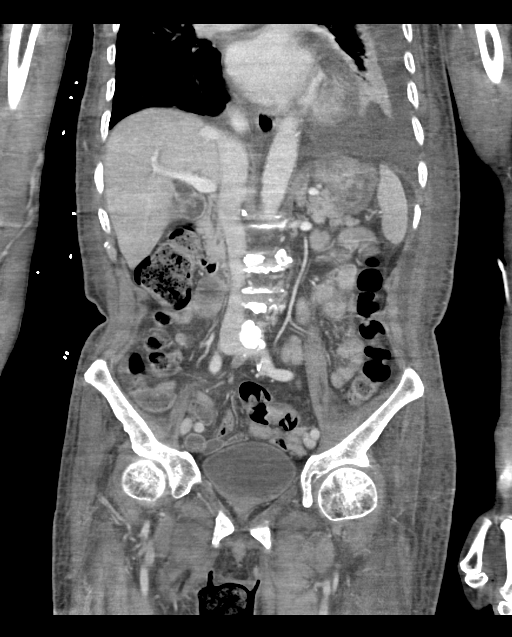
[im 59/107  soft-tissue]
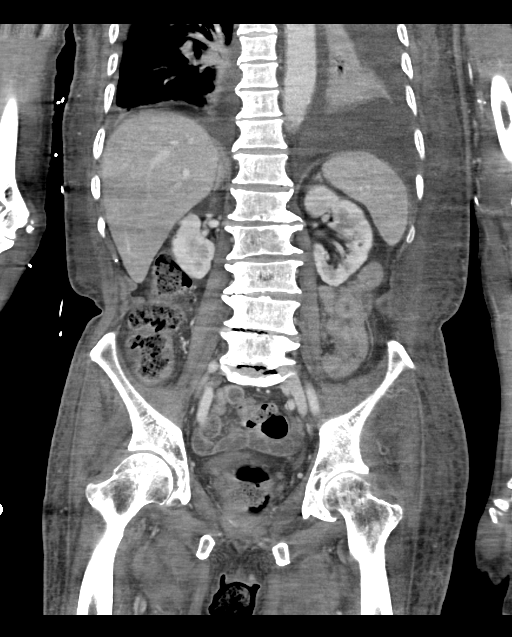

[16 of 46 positions shown; findings below may reference images not displayed]

FINDINGS: Lower chest: There are moderate to large bilateral pleural effusions
which are only partially visualized on this exam. There is adjacent
atelectasis. Heart is mildly enlarged.

Hepatobiliary: No focal liver abnormality is seen. There is mild
gallbladder wall thickening. There is no evidence of biliary ductal
dilatation.

Pancreas: Unremarkable. No pancreatic ductal dilatation or
surrounding inflammatory changes.

Spleen: Normal in size without focal abnormality.

Adrenals/Urinary Tract: The bilateral adrenal glands are
unremarkable. The right kidney is unremarkable with no
hydronephrosis. There is a 5-6 mm nonobstructing stone in the upper
pole of the left kidney. The bladder is unremarkable.

Stomach/Bowel: There is a large amount of stool in the rectum. There
is no evidence of a small-bowel obstruction. There is some mild
gastric wall thickening.

Vascular/Lymphatic: Aortic atherosclerosis. No enlarged abdominal or
pelvic lymph nodes. There is apparent complete occlusion of the
right internal iliac artery proximally.

Reproductive: Prostate is unremarkable.

Other: There is diffuse body wall edema. There is a trace amount of
free fluid in the abdomen.

Musculoskeletal: There are advanced degenerative changes throughout
the visualized lumbar spine. There is no displaced fracture.
IMPRESSION: 1. Moderate to large bilateral pleural effusions.
2. Mild-to-moderate anasarca.
3. Large amount of stool in the rectum.
4. Nonobstructing stone in the upper pole the left kidney. No
hydronephrosis.
5. Mild gallbladder wall thickening which is nonspecific and can be
seen in the presence of ascites or heart failure. If there is
clinical concern for acute cholecystitis follow-up with ultrasound
is recommended.
